# Patient Record
Sex: Female | Born: 1945 | Race: Black or African American | Hispanic: No | Marital: Married | Smoking: Never smoker
Health system: Southern US, Community
[De-identification: ages and names within clinical notes are randomized; demographics above are authoritative.]

## PROBLEM LIST (undated history)

## (undated) DIAGNOSIS — E119 Type 2 diabetes mellitus without complications: Secondary | ICD-10-CM

## (undated) DIAGNOSIS — I4891 Unspecified atrial fibrillation: Secondary | ICD-10-CM

## (undated) DIAGNOSIS — I10 Essential (primary) hypertension: Secondary | ICD-10-CM

## (undated) DIAGNOSIS — I639 Cerebral infarction, unspecified: Secondary | ICD-10-CM

## (undated) HISTORY — PX: TONSILLECTOMY: SUR1361

## (undated) HISTORY — PX: CHOLECYSTECTOMY: SHX55

## (undated) HISTORY — PX: BILATERAL OOPHORECTOMY: SHX1221

## (undated) HISTORY — PX: PACEMAKER INSERTION: SHX728

## (undated) HISTORY — PX: ABDOMINAL HYSTERECTOMY: SHX81

---

## 2014-03-06 ENCOUNTER — Emergency Department (HOSPITAL_BASED_OUTPATIENT_CLINIC_OR_DEPARTMENT_OTHER): Payer: Medicare Other

## 2014-03-06 ENCOUNTER — Encounter (HOSPITAL_BASED_OUTPATIENT_CLINIC_OR_DEPARTMENT_OTHER): Payer: Self-pay | Admitting: Emergency Medicine

## 2014-03-06 ENCOUNTER — Emergency Department (HOSPITAL_BASED_OUTPATIENT_CLINIC_OR_DEPARTMENT_OTHER)
Admission: EM | Admit: 2014-03-06 | Discharge: 2014-03-06 | Disposition: A | Discharge status: Home / Self Care | Payer: Medicare Other | Attending: Emergency Medicine | Admitting: Emergency Medicine

## 2014-03-06 DIAGNOSIS — R0602 Shortness of breath: Secondary | ICD-10-CM | POA: Insufficient documentation

## 2014-03-06 DIAGNOSIS — Z950 Presence of cardiac pacemaker: Secondary | ICD-10-CM | POA: Insufficient documentation

## 2014-03-06 DIAGNOSIS — R209 Unspecified disturbances of skin sensation: Secondary | ICD-10-CM | POA: Insufficient documentation

## 2014-03-06 DIAGNOSIS — R0789 Other chest pain: Secondary | ICD-10-CM | POA: Insufficient documentation

## 2014-03-06 DIAGNOSIS — R079 Chest pain, unspecified: Secondary | ICD-10-CM

## 2014-03-06 DIAGNOSIS — I10 Essential (primary) hypertension: Secondary | ICD-10-CM | POA: Insufficient documentation

## 2014-03-06 DIAGNOSIS — R6883 Chills (without fever): Secondary | ICD-10-CM | POA: Insufficient documentation

## 2014-03-06 HISTORY — DX: Essential (primary) hypertension: I10

## 2014-03-06 LAB — BASIC METABOLIC PANEL
BUN: 19 mg/dL (ref 6–23)
CHLORIDE: 101 meq/L (ref 96–112)
CO2: 24 meq/L (ref 19–32)
Calcium: 9.4 mg/dL (ref 8.4–10.5)
Creatinine, Ser: 1 mg/dL (ref 0.50–1.10)
GFR calc Af Amer: 67 mL/min — ABNORMAL LOW (ref >90)
GFR, EST NON AFRICAN AMERICAN: 57 mL/min — AB (ref >90)
GLUCOSE: 152 mg/dL — AB (ref 70–99)
POTASSIUM: 4 meq/L (ref 3.7–5.3)
SODIUM: 141 meq/L (ref 137–147)

## 2014-03-06 LAB — TROPONIN I: Troponin I: <0.3 ng/mL (ref <0.30)

## 2014-03-06 LAB — CBC
HEMATOCRIT: 36.4 % (ref 36.0–46.0)
Hemoglobin: 12.6 g/dL (ref 12.0–15.0)
MCH: 31.3 pg (ref 26.0–34.0)
MCHC: 34.6 g/dL (ref 30.0–36.0)
MCV: 90.3 fL (ref 78.0–100.0)
Platelets: 243 10*3/uL (ref 150–400)
RBC: 4.03 MIL/uL (ref 3.87–5.11)
RDW: 12.6 % (ref 11.5–15.5)
WBC: 6.7 10*3/uL (ref 4.0–10.5)

## 2014-03-06 MED ORDER — NITROGLYCERIN 0.4 MG SL SUBL: 0.4000 mg | SUBLINGUAL_TABLET | SUBLINGUAL | Status: DC | PRN

## 2014-03-06 MED ORDER — ASPIRIN 81 MG PO CHEW
324.0000 mg | CHEWABLE_TABLET | Freq: Once | ORAL | Status: AC
  Administered 2014-03-06: 324 mg via ORAL
  Filled 2014-03-06: qty 4

## 2014-03-06 NOTE — ED Notes (Signed)
1415 after driving back from the store she got weakness in left arm. Now she feels weak on her whole left side.

## 2014-03-06 NOTE — ED Notes (Signed)
MD Criss Alvine stated Pt was able to eat and drink. Gave Pt water, cheese and graham crackers

## 2014-03-06 NOTE — ED Provider Notes (Signed)
CSN: [id number]     Arrival date & time 03/06/14  1639 History  This chart was scribed for Brooke Camel, MD by Charline Bills, ED Scribe. The patient was seen in room MH09/MH09. Patient's care was started at 4:59 PM.   Chief Complaint  Patient presents with  . Weakness   The history is provided by the patient. No language interpreter was used.   HPI Comments: Brooke Pitts is a 68 y.o. female who presents to the Emergency Department complaining of weakness onset 2:30 PM. She states that she was at the grocery store for approximately 20 minutes before the symptoms began. Pt checked her blood pressure with readings of 117/91 and pulse in the 90s before relaxing in a recliner. Pt reports intermittent SOB, chest tightness that has improved, L arm weakness, L arm numbness/tingling, chills. She denies HA, visual disturbances, weakness in L leg. Pt reports similar episode 4 days ago and went to the ED at [facility] who obtained blood work which was normal and a CT which was normal. Pt states that they suspected a mini stroke but did not obtain a MRI due to her pace maker; pt was discharged. Pt states that she has a MRI compatible pace maker inserted in 10/2013. She also reports a milder episode 3 days ago. She took a baby ASA this morning. Pt has a h/o tachycardia and angina. She states that this episode is milder but more frequent than her angina. She denies h/o blockage.  She had a pacemaker placed in January 2015 and had a negative heart cath at that time.  Cardiologist: Chales Abrahams in [facility]  Past Medical History  Diagnosis Date  . Hypertension    Past Surgical History  Procedure Laterality Date  . Pacemaker insertion    . Abdominal hysterectomy    . Cholecystectomy     No family history on file. History  Substance Use Topics  . Smoking status: Not on file  . Smokeless tobacco: Not on file  . Alcohol Use: Not on file   OB History   Grav Para Term Preterm Abortions TAB SAB Ect Mult Living                  Review of Systems  Constitutional: Positive for chills.  Eyes: Negative for visual disturbance.  Respiratory: Positive for chest tightness (improved) and shortness of breath.   Cardiovascular: Positive for chest pain. Negative for leg swelling.  Gastrointestinal: Negative for nausea, vomiting and abdominal pain.  Musculoskeletal: Negative for neck pain.  Neurological: Positive for weakness and numbness. Negative for headaches.  All other systems reviewed and are negative.  Allergies  Review of patient's allergies indicates not on file.  Home Medications   Prior to Admission medications   Not on File   Triage Vitals: BP 129/76  Pulse 71  Temp(Src) 98 F (36.7 C) (Oral)  Resp 18  Ht 5\' 5"  (1.651 m)  Wt 211 lb (95.709 kg)  BMI 35.11 kg/m2  SpO2 100% Physical Exam  Nursing note and vitals reviewed. Constitutional: She is oriented to person, place, and time. She appears well-developed and well-nourished. No distress.  HENT:  Head: Normocephalic and atraumatic.  Right Ear: External ear normal.  Left Ear: External ear normal.  Nose: Nose normal.  Eyes: Right eye exhibits no discharge. Left eye exhibits no discharge.  Neck: Neck supple.  Cardiovascular: Normal rate, regular rhythm and normal heart sounds.   Pulses:      Radial pulses are 2+ on the  right side, and 2+ on the left side.  Pulmonary/Chest: Effort normal and breath sounds normal. No respiratory distress. She exhibits no tenderness.  Abdominal: She exhibits no distension. There is no tenderness.  Musculoskeletal: Normal range of motion.  Neurological: She is alert and oriented to person, place, and time.  cranial nerves 2-12 grossly intact 5/5 strengths in all extremities   Skin: Skin is warm and dry.  Psychiatric: She has a normal mood and affect. Her behavior is normal.   ED Course  Procedures (including critical care time) DIAGNOSTIC STUDIES: Oxygen Saturation is 100% on RA, normal by my  interpretation.    COORDINATION OF CARE: 5:09 PM Discussed treatment plan with pt at bedside and pt agreed to plan.  Labs Review Labs Reviewed  BASIC METABOLIC PANEL - Abnormal; Notable for the following:    Glucose, Bld 152 (*)    GFR calc non Af Amer 57 (*)    GFR calc Af Amer 67 (*)    All other components within normal limits  CBC  TROPONIN I  TROPONIN I    Imaging Review Dg Chest 2 View  03/06/2014   CLINICAL DATA:  Chest pain  EXAM: CHEST  2 VIEW  COMPARISON:  10/26/2013  FINDINGS: Dual lead pacemaker unchanged from the prior study. Heart size is upper normal. Negative for heart failure. Lungs are clear.  IMPRESSION: No active cardiopulmonary disease.   Electronically Signed   By: Marlan Palauharles  Clark M.D.   On: 03/06/2014 18:21     Date: 03/06/2014  Rate: 79  Rhythm: atrially paced rhythm  QRS Axis: normal  Intervals: PR prolonged  ST/T Wave abnormalities: nonspecific T wave changes  Conduction Disutrbances:none  Narrative Interpretation:   Old EKG Reviewed: none available    MDM   Final diagnoses:  Chest pain    Patient with atpyical chest tightness, dyspnea and arm "weakness". Her neuro exam is normal, including sensation and strength. Had a reportedly negative CT a few days ago, but this does not seem like a CVA given she has normal neuro testing and this is with chest symptoms. Doubt dissection given intermittent symptoms. She relates this to "angina". EKG nonspecific, troponin normal. Discussed with Dr. Chales Abrahamsyson, her cardiologist, who relates she has a completely normal cath and subsequent echo in the last 3 months. Thus ACS is unlikely. He recommends delta troponin, and if negative she will call his office in the AM for close follow up. Patient agreeable to this plan.   I personally performed the services described in this documentation, which was scribed in my presence. The recorded information has been reviewed and is accurate.    Brooke CamelScott T Rital Cavey, MD 03/06/14  2149

## 2014-03-06 NOTE — Discharge Instructions (Signed)
Chest Pain (Nonspecific) °It is often hard to give a specific diagnosis for the cause of chest pain. There is always a chance that your pain could be related to something serious, such as a heart attack or a blood clot in the lungs. You need to follow up with your caregiver for further evaluation. °CAUSES  °· Heartburn. °· Pneumonia or bronchitis. °· Anxiety or stress. °· Inflammation around your heart (pericarditis) or lung (pleuritis or pleurisy). °· A blood clot in the lung. °· A collapsed lung (pneumothorax). It can develop suddenly on its own (spontaneous pneumothorax) or from injury (trauma) to the chest. °· Shingles infection (herpes zoster virus). °The chest wall is composed of bones, muscles, and cartilage. Any of these can be the source of the pain. °· The bones can be bruised by injury. °· The muscles or cartilage can be strained by coughing or overwork. °· The cartilage can be affected by inflammation and become sore (costochondritis). °DIAGNOSIS  °Lab tests or other studies, such as X-rays, electrocardiography, stress testing, or cardiac imaging, may be needed to find the cause of your pain.  °TREATMENT  °· Treatment depends on what may be causing your chest pain. Treatment may include: °· Acid blockers for heartburn. °· Anti-inflammatory medicine. °· Pain medicine for inflammatory conditions. °· Antibiotics if an infection is present. °· You may be advised to change lifestyle habits. This includes stopping smoking and avoiding alcohol, caffeine, and chocolate. °· You may be advised to keep your head raised (elevated) when sleeping. This reduces the chance of acid going backward from your stomach into your esophagus. °· Most of the time, nonspecific chest pain will improve within 2 to 3 days with rest and mild pain medicine. °HOME CARE INSTRUCTIONS  °· If antibiotics were prescribed, take your antibiotics as directed. Finish them even if you start to feel better. °· For the next few days, avoid physical  activities that bring on chest pain. Continue physical activities as directed. °· Do not smoke. °· Avoid drinking alcohol. °· Only take over-the-counter or prescription medicine for pain, discomfort, or fever as directed by your caregiver. °· Follow your caregiver's suggestions for further testing if your chest pain does not go away. °· Keep any follow-up appointments you made. If you do not go to an appointment, you could develop lasting (chronic) problems with pain. If there is any problem keeping an appointment, you must call to reschedule. °SEEK MEDICAL CARE IF:  °· You think you are having problems from the medicine you are taking. Read your medicine instructions carefully. °· Your chest pain does not go away, even after treatment. °· You develop a rash with blisters on your chest. °SEEK IMMEDIATE MEDICAL CARE IF:  °· You have increased chest pain or pain that spreads to your arm, neck, jaw, back, or abdomen. °· You develop shortness of breath, an increasing cough, or you are coughing up blood. °· You have severe back or abdominal pain, feel nauseous, or vomit. °· You develop severe weakness, fainting, or chills. °· You have a fever. °THIS IS AN EMERGENCY. Do not wait to see if the pain will go away. Get medical help at once. Call your local emergency services (911 in [place] Do not drive yourself to the hospital. °MAKE SURE YOU:  °· Understand these instructions. °· Will watch your condition. °· Will get help right away if you are not doing well or get worse. °Document Released: 07/09/2005 Document Revised: 12/22/2011 Document Reviewed: 05/04/2008 °Exit[employer]® Patient Information ©2014 [employer]   [employer]. ° °

## 2014-03-06 NOTE — ED Notes (Signed)
Brooke Pitts -will call back on [phone]

## 2015-10-03 DIAGNOSIS — E785 Hyperlipidemia, unspecified: Secondary | ICD-10-CM | POA: Insufficient documentation

## 2015-10-03 DIAGNOSIS — I10 Essential (primary) hypertension: Secondary | ICD-10-CM

## 2015-10-03 DIAGNOSIS — E1142 Type 2 diabetes mellitus with diabetic polyneuropathy: Secondary | ICD-10-CM

## 2015-10-03 HISTORY — DX: Essential (primary) hypertension: I10

## 2015-10-03 HISTORY — DX: Hyperlipidemia, unspecified: E78.5

## 2015-10-18 DIAGNOSIS — Z8673 Personal history of transient ischemic attack (TIA), and cerebral infarction without residual deficits: Secondary | ICD-10-CM | POA: Insufficient documentation

## 2015-10-18 DIAGNOSIS — Z7901 Long term (current) use of anticoagulants: Secondary | ICD-10-CM

## 2015-10-18 DIAGNOSIS — I4891 Unspecified atrial fibrillation: Secondary | ICD-10-CM | POA: Insufficient documentation

## 2015-10-18 HISTORY — DX: Long term (current) use of anticoagulants: Z79.01

## 2015-10-18 HISTORY — DX: Personal history of transient ischemic attack (TIA), and cerebral infarction without residual deficits: Z86.73

## 2015-11-02 DIAGNOSIS — E119 Type 2 diabetes mellitus without complications: Secondary | ICD-10-CM

## 2015-11-02 HISTORY — DX: Type 2 diabetes mellitus without complications: E11.9

## 2015-11-03 DIAGNOSIS — K219 Gastro-esophageal reflux disease without esophagitis: Secondary | ICD-10-CM

## 2015-11-03 DIAGNOSIS — R0789 Other chest pain: Secondary | ICD-10-CM | POA: Insufficient documentation

## 2015-11-03 HISTORY — DX: Other chest pain: R07.89

## 2015-11-03 HISTORY — DX: Gastro-esophageal reflux disease without esophagitis: K21.9

## 2015-11-17 DIAGNOSIS — Z9989 Dependence on other enabling machines and devices: Secondary | ICD-10-CM

## 2015-11-17 DIAGNOSIS — Z950 Presence of cardiac pacemaker: Secondary | ICD-10-CM | POA: Insufficient documentation

## 2015-11-17 DIAGNOSIS — Z8669 Personal history of other diseases of the nervous system and sense organs: Secondary | ICD-10-CM

## 2015-11-17 DIAGNOSIS — H8110 Benign paroxysmal vertigo, unspecified ear: Secondary | ICD-10-CM

## 2015-11-17 DIAGNOSIS — K5000 Crohn's disease of small intestine without complications: Secondary | ICD-10-CM

## 2015-11-17 DIAGNOSIS — G4733 Obstructive sleep apnea (adult) (pediatric): Secondary | ICD-10-CM

## 2015-11-17 DIAGNOSIS — Z8739 Personal history of other diseases of the musculoskeletal system and connective tissue: Secondary | ICD-10-CM

## 2015-11-17 HISTORY — DX: Personal history of other diseases of the nervous system and sense organs: Z86.69

## 2015-11-17 HISTORY — DX: Crohn's disease of small intestine without complications: K50.00

## 2015-11-17 HISTORY — DX: Dependence on other enabling machines and devices: Z99.89

## 2015-11-17 HISTORY — DX: Disorder of iron metabolism, unspecified: E83.10

## 2015-11-17 HISTORY — DX: Obstructive sleep apnea (adult) (pediatric): G47.33

## 2015-11-17 HISTORY — DX: Personal history of other diseases of the musculoskeletal system and connective tissue: Z87.39

## 2015-11-17 HISTORY — DX: Benign paroxysmal vertigo, unspecified ear: H81.10

## 2015-11-17 HISTORY — DX: Presence of cardiac pacemaker: Z95.0

## 2015-11-20 DIAGNOSIS — Z45018 Encounter for adjustment and management of other part of cardiac pacemaker: Secondary | ICD-10-CM | POA: Insufficient documentation

## 2015-11-20 DIAGNOSIS — I495 Sick sinus syndrome: Secondary | ICD-10-CM

## 2015-11-20 HISTORY — DX: Encounter for adjustment and management of other part of cardiac pacemaker: Z45.018

## 2015-11-20 HISTORY — DX: Sick sinus syndrome: I49.5

## 2015-12-03 DIAGNOSIS — E559 Vitamin D deficiency, unspecified: Secondary | ICD-10-CM

## 2015-12-03 HISTORY — DX: Vitamin D deficiency, unspecified: E55.9

## 2016-02-15 DIAGNOSIS — E1141 Type 2 diabetes mellitus with diabetic mononeuropathy: Secondary | ICD-10-CM | POA: Insufficient documentation

## 2016-02-15 HISTORY — DX: Type 2 diabetes mellitus with diabetic mononeuropathy: E11.41

## 2016-06-30 DIAGNOSIS — E876 Hypokalemia: Secondary | ICD-10-CM

## 2016-06-30 HISTORY — DX: Hypokalemia: E87.6

## 2016-07-08 DIAGNOSIS — R790 Abnormal level of blood mineral: Secondary | ICD-10-CM | POA: Insufficient documentation

## 2016-07-08 HISTORY — DX: Abnormal level of blood mineral: R79.0

## 2016-07-08 HISTORY — DX: Hypomagnesemia: E83.42

## 2016-08-21 DIAGNOSIS — Z9181 History of falling: Secondary | ICD-10-CM

## 2016-08-21 HISTORY — DX: History of falling: Z91.81

## 2016-09-18 DIAGNOSIS — E66811 Obesity, class 1: Secondary | ICD-10-CM

## 2016-09-18 DIAGNOSIS — E669 Obesity, unspecified: Secondary | ICD-10-CM

## 2016-09-18 HISTORY — DX: Obesity, class 1: E66.811

## 2016-09-18 HISTORY — DX: Obesity, unspecified: E66.9

## 2016-11-27 ENCOUNTER — Emergency Department (HOSPITAL_BASED_OUTPATIENT_CLINIC_OR_DEPARTMENT_OTHER): Payer: Medicare Other

## 2016-11-27 ENCOUNTER — Encounter (HOSPITAL_BASED_OUTPATIENT_CLINIC_OR_DEPARTMENT_OTHER): Payer: Self-pay

## 2016-11-27 ENCOUNTER — Emergency Department (HOSPITAL_BASED_OUTPATIENT_CLINIC_OR_DEPARTMENT_OTHER)
Admission: EM | Admit: 2016-11-27 | Discharge: 2016-11-27 | Disposition: A | Discharge status: Home / Self Care | Payer: Medicare Other | Attending: Emergency Medicine | Admitting: Emergency Medicine

## 2016-11-27 DIAGNOSIS — Z7982 Long term (current) use of aspirin: Secondary | ICD-10-CM | POA: Diagnosis not present

## 2016-11-27 DIAGNOSIS — I10 Essential (primary) hypertension: Secondary | ICD-10-CM | POA: Insufficient documentation

## 2016-11-27 DIAGNOSIS — R002 Palpitations: Secondary | ICD-10-CM | POA: Insufficient documentation

## 2016-11-27 DIAGNOSIS — R202 Paresthesia of skin: Secondary | ICD-10-CM | POA: Insufficient documentation

## 2016-11-27 DIAGNOSIS — Z7901 Long term (current) use of anticoagulants: Secondary | ICD-10-CM | POA: Diagnosis not present

## 2016-11-27 DIAGNOSIS — Z7984 Long term (current) use of oral hypoglycemic drugs: Secondary | ICD-10-CM | POA: Diagnosis not present

## 2016-11-27 DIAGNOSIS — R0600 Dyspnea, unspecified: Secondary | ICD-10-CM | POA: Diagnosis not present

## 2016-11-27 DIAGNOSIS — E119 Type 2 diabetes mellitus without complications: Secondary | ICD-10-CM | POA: Insufficient documentation

## 2016-11-27 DIAGNOSIS — Z950 Presence of cardiac pacemaker: Secondary | ICD-10-CM | POA: Insufficient documentation

## 2016-11-27 DIAGNOSIS — R0602 Shortness of breath: Secondary | ICD-10-CM | POA: Diagnosis present

## 2016-11-27 DIAGNOSIS — G5622 Lesion of ulnar nerve, left upper limb: Secondary | ICD-10-CM

## 2016-11-27 HISTORY — DX: Unspecified atrial fibrillation: I48.91

## 2016-11-27 HISTORY — DX: Cerebral infarction, unspecified: I63.9

## 2016-11-27 HISTORY — DX: Type 2 diabetes mellitus without complications: E11.9

## 2016-11-27 LAB — COMPREHENSIVE METABOLIC PANEL
ALT: 19 U/L (ref 14–54)
AST: 22 U/L (ref 15–41)
Albumin: 3.9 g/dL (ref 3.5–5.0)
Alkaline Phosphatase: 75 U/L (ref 38–126)
Anion gap: 7 (ref 5–15)
BUN: 17 mg/dL (ref 6–20)
CO2: 30 mmol/L (ref 22–32)
Calcium: 9.2 mg/dL (ref 8.9–10.3)
Chloride: 99 mmol/L — ABNORMAL LOW (ref 101–111)
Creatinine, Ser: 1.41 mg/dL — ABNORMAL HIGH (ref 0.44–1.00)
GFR calc Af Amer: 43 mL/min — ABNORMAL LOW (ref >60)
GFR, EST NON AFRICAN AMERICAN: 37 mL/min — AB (ref >60)
Glucose, Bld: 234 mg/dL — ABNORMAL HIGH (ref 65–99)
Potassium: 3.9 mmol/L (ref 3.5–5.1)
Sodium: 136 mmol/L (ref 135–145)
TOTAL PROTEIN: 6.8 g/dL (ref 6.5–8.1)
Total Bilirubin: 0.6 mg/dL (ref 0.3–1.2)

## 2016-11-27 LAB — CBC WITH DIFFERENTIAL/PLATELET
BASOS ABS: 0 10*3/uL (ref 0.0–0.1)
Basophils Relative: 0 %
EOS PCT: 2 %
Eosinophils Absolute: 0.2 10*3/uL (ref 0.0–0.7)
HCT: 37 % (ref 36.0–46.0)
Hemoglobin: 12.5 g/dL (ref 12.0–15.0)
LYMPHS PCT: 50 %
Lymphs Abs: 3.5 10*3/uL (ref 0.7–4.0)
MCH: 30.6 pg (ref 26.0–34.0)
MCHC: 33.8 g/dL (ref 30.0–36.0)
MCV: 90.5 fL (ref 78.0–100.0)
MONO ABS: 0.5 10*3/uL (ref 0.1–1.0)
MONOS PCT: 7 %
Neutro Abs: 2.9 10*3/uL (ref 1.7–7.7)
Neutrophils Relative %: 41 %
Platelets: 216 10*3/uL (ref 150–400)
RBC: 4.09 MIL/uL (ref 3.87–5.11)
RDW: 12.9 % (ref 11.5–15.5)
WBC: 7.1 10*3/uL (ref 4.0–10.5)

## 2016-11-27 LAB — I-STAT TROPONIN, ED
TROPONIN I, POC: 0.01 ng/mL (ref 0.00–0.08)
TROPONIN I, POC: 0 ng/mL (ref 0.00–0.08)

## 2016-11-27 LAB — PROTIME-INR
INR: 1.95
PROTHROMBIN TIME: 22.5 s — AB (ref 11.4–15.2)

## 2016-11-27 NOTE — Progress Notes (Signed)
I-stat troponin result given to Dr Schlossman. 

## 2016-11-27 NOTE — ED Provider Notes (Signed)
[facility] Note   CSN: [id number] Arrival date & time: 11/27/16  1912  By signing my name below, I, Brooke Pitts, attest that this documentation has been prepared under the direction and in the presence of physician practitioner, Brooke MondayErin Lateshia Schmoker, MD. Electronically Signed: Linna Darnerussell Pitts, Scribe. 11/27/2016. 9:21 PM.  History   Chief Complaint Chief Complaint  Patient presents with  . Shortness of Breath    The history is provided by the patient. No language interpreter was used.     HPI Comments: Brooke Pitts is a 71 y.o. female with pacemaker and anticoagulated with Coumadin, with PMHx of A-Fib, DM, HTN, and stroke, who presents to the Emergency Department complaining of persistent SOB following an episode of chest pain d/t indigestion around 6 PM this evening. She states she ate cabbage around 530 PM this evening and her indigestion presented ~ 30 minutes afterwards. Pt took OTC medication with transient improvement of her indigestion and experienced SOB, left arm pain secondary to a "knot", and numbness/tingling in her left fingers shortly after her indigestion began. She states it felt like she was having difficulty taking a deep breath PTA. No modifying factors noted. She states her indigestion has resolved and she feels better. Pt notes her numbness/tingling is still present. Pt notes her Coumadin level was measured earlier today and was within normal limits. She denies nausea, vomiting, diaphoresis, abdominal pain, cough, fever, leg pain/swelling, or any other associated symptoms.  Past Medical History:  Diagnosis Date  . A-fib (HCC)   . Diabetes mellitus without complication (HCC)   . Hypertension   . Stroke [facility])     There are no active problems to display for this patient.   Past Surgical History:  Procedure Laterality Date  . ABDOMINAL HYSTERECTOMY    . CHOLECYSTECTOMY    . PACEMAKER INSERTION    . TONSILLECTOMY      OB History    No  data available       Home Medications    Prior to Admission medications   Medication Sig Start Date End Date Taking? Authorizing Provider  UNKNOWN TO PATIENT Another BP med   Yes Historical Provider, MD  Warfarin Sodium (COUMADIN PO) Take by mouth.   Yes Historical Provider, MD  atenolol (TENORMIN) 50 MG tablet Take 50 mg by mouth daily.    Historical Provider, MD  b complex vitamins tablet Take 1 tablet by mouth daily.    Historical Provider, MD  Calcium Carbonate-Vitamin D (CALCIUM + D PO) Take by mouth.    Historical Provider, MD  metFORMIN (GLUCOPHAGE) 500 MG tablet Take by mouth 2 (two) times daily with a meal.    Historical Provider, MD  Omega-3 Fatty Acids (FISH OIL) 1200 MG CPDR Take by mouth.    Historical Provider, MD  ranolazine (RANEXA) 500 MG 12 hr tablet Take 500 mg by mouth 2 (two) times daily.    Historical Provider, MD  triamterene-hydrochlorothiazide (DYAZIDE) 37.5-25 MG per capsule Take 1 capsule by mouth daily.    Historical Provider, MD    Family History No family history on file.  Social History Social History  Substance Use Topics  . Smoking status: Never Smoker  . Smokeless tobacco: Never Used  . Alcohol use No     Allergies   Cymbalta [duloxetine hcl]; Hydrocodone; and Lisinopril   Review of Systems Review of Systems  Constitutional: Negative for diaphoresis and fever.  HENT: Negative for sore throat.   Eyes: Negative for visual disturbance.  Respiratory: Positive for shortness of breath. Negative for cough.   Cardiovascular: Positive for chest pain (indigestion) and palpitations. Negative for leg swelling.  Gastrointestinal: Negative for abdominal pain, nausea and vomiting.  Genitourinary: Negative for difficulty urinating.  Musculoskeletal: Positive for myalgias. Negative for back pain and neck pain.  Skin: Negative for rash.  Neurological: Positive for numbness. Negative for syncope and headaches.  Hematological: Bruises/bleeds easily.      Physical Exam Updated Vital Signs BP 140/78 (BP Location: Left Arm)   Pulse 70   Temp 98.2 F (36.8 C) (Oral)   Resp 17   Ht 5\' 4"  (1.626 m)   Wt 222 lb (100.7 kg)   SpO2 100%   BMI 38.11 kg/m   Physical Exam  Constitutional: She is oriented to person, place, and time. She appears well-developed and well-nourished. No distress.  HENT:  Head: Normocephalic and atraumatic.  Eyes: Conjunctivae and EOM are normal.  Neck: Normal range of motion. Neck supple. No JVD present. No tracheal deviation present.  Cardiovascular: Normal rate, regular rhythm, normal heart sounds and intact distal pulses.  Exam reveals no gallop and no friction rub.   No murmur heard. Normal pulses.  Pulmonary/Chest: Effort normal and breath sounds normal. No respiratory distress. She has no wheezes. She has no rales.  Abdominal: Soft. She exhibits no distension. There is no tenderness. There is no guarding.  Musculoskeletal: Normal range of motion. She exhibits no edema or tenderness.  Neurological: She is alert and oriented to person, place, and time.  Altered sensation on ulnar side of left hand.  Skin: Skin is warm and dry. No rash noted. She is not diaphoretic. No erythema.  0.8 cm circular, mobile nodule with bluish discoloration left AC. No fluctuance. No extension of area. No arm swelling.  Psychiatric: She has a normal mood and affect. Her behavior is normal.  Nursing note and vitals reviewed.    ED Treatments / Results  Labs (all labs ordered are listed, but only abnormal results are displayed) Labs Reviewed  COMPREHENSIVE METABOLIC PANEL - Abnormal; Notable for the following:       Result Value   Chloride 99 (*)    Glucose, Bld 234 (*)    Creatinine, Ser 1.41 (*)    GFR calc non Af Amer 37 (*)    GFR calc Af Amer 43 (*)    All other components within normal limits  PROTIME-INR - Abnormal; Notable for the following:    Prothrombin Time 22.5 (*)    All other components within normal  limits  CBC WITH DIFFERENTIAL/PLATELET  I-STAT TROPOININ, ED  I-STAT TROPOININ, ED    EKG  EKG Interpretation  Date/Time:  Thursday November 27 2016 19:34:36 EST Ventricular Rate:  75 PR Interval:  186 QRS Duration: 82 QT Interval:  386 QTC Calculation: 432 R Axis:   -2 Text Interpretation:  Sinus rhythm Ventricular premature complex Low voltage, precordial leads Baseline wander in lead(s) III aVF V4 Rhythm more clearly sinus.  No other significant change Confirmed by [facility] MD, Dalisha Shively ([id number]) on 11/27/2016 8:28:33 PM       Radiology Dg Chest 2 View  Result Date: 11/27/2016 CLINICAL DATA:  Shortness of breath EXAM: CHEST  2 VIEW COMPARISON:  Chest radiograph 08/17/2016 FINDINGS: Left chest wall pacemaker leads are in expected location. The lungs are well inflated. Cardiomediastinal contours are normal. No pneumothorax or pleural effusion. No focal airspace consolidation or pulmonary edema. IMPRESSION: Clear lungs. Electronically Signed   By: Chrisandra Netters.D.  On: 11/27/2016 20:06    Procedures Procedures (including critical care time)  DIAGNOSTIC STUDIES: Oxygen Saturation is 100% on RA, normal by my interpretation.    COORDINATION OF CARE: 9:34 PM Discussed treatment plan with pt at bedside and pt agreed to plan.  Medications Ordered in ED Medications - No data to display   Initial Impression / Assessment and Plan / ED Course  I have reviewed the triage vital signs and the nursing notes.  Pertinent labs & imaging results that were available during my care of the patient were reviewed by me and considered in my medical decision making (see chart for details).     71yo female with hx of afib on coumadin, Dm, htn, DVA presents with concern for episode of dyspneaj and left arm lesion. EKG was done and evaluate by me and showed no acute ST changes and no signs of pericarditis. Chest x-ray was done and evaluated by me and radiology and showed no sign of pneumonia or  pneumothorax. Have low suspicion for PE given no continuing dyspnea, pt on coumadin (with level therapeutic earlier today), no hypoxia, no tachypnea.  Patient had delta troponins which were both negative and overall lower suspicion for cardiac pain by history. Patient without symptoms now, no exertional symptoms, and indigestion pain that began after eating. Given this evaluation, history and physical have low suspicion for pulmonary embolus, pneumonia, ACS, myocarditis, pericarditis, dissection.   She also reports pain over nodule in left arm and tingling on ulnar side of hand.  Area of concern appears to be more likely scar tissue, or other nerve lesion. No sign of infection, doubt DVT given very localized to circular cystic area, no other palpable cord.  Discussed that this lesion may be affecting ulnar tingling. Patient discharged in stable condition with understanding of reasons to return. Recommend close Cardiology followup.   Final Clinical Impressions(s) / ED Diagnoses   Final diagnoses:  Dyspnea, unspecified type  Tingling in left ulnar nerve distribution, cystic lesion present left AC and contusion    New Prescriptions Discharge Medication List as of 11/27/2016 11:17 PM    I personally performed the services described in this documentation, which was scribed in my presence. The recorded information has been reviewed and is accurate.    Brooke Monday, MD 11/28/16 [phone]

## 2016-11-27 NOTE — ED Triage Notes (Signed)
C/o SOB x 1 hour-"knot" to left arm with tingling x 1 hour-denies injury to arm-pt NAD-steady gait

## 2016-11-27 NOTE — ED Notes (Signed)
ED Provider at bedside. 

## 2016-11-27 NOTE — ED Notes (Signed)
Patient transported to X-ray 

## 2016-11-27 NOTE — Progress Notes (Signed)
I stat troponin results given to Dr. Schlossman  

## 2017-04-17 DIAGNOSIS — H20021 Recurrent acute iridocyclitis, right eye: Secondary | ICD-10-CM

## 2017-04-17 HISTORY — DX: Recurrent acute iridocyclitis, right eye: H20.021

## 2017-06-10 ENCOUNTER — Ambulatory Visit (INDEPENDENT_AMBULATORY_CARE_PROVIDER_SITE_OTHER): Payer: Medicare Other | Admitting: Cardiology

## 2017-06-10 ENCOUNTER — Encounter: Payer: Self-pay | Admitting: Cardiology

## 2017-06-10 VITALS — BP 126/60 | HR 72 | Ht 64.0 in | Wt 221.1 lb

## 2017-06-10 DIAGNOSIS — E785 Hyperlipidemia, unspecified: Secondary | ICD-10-CM | POA: Diagnosis not present

## 2017-06-10 DIAGNOSIS — Z8673 Personal history of transient ischemic attack (TIA), and cerebral infarction without residual deficits: Secondary | ICD-10-CM

## 2017-06-10 DIAGNOSIS — Z950 Presence of cardiac pacemaker: Secondary | ICD-10-CM | POA: Diagnosis not present

## 2017-06-10 DIAGNOSIS — I4891 Unspecified atrial fibrillation: Secondary | ICD-10-CM

## 2017-06-10 DIAGNOSIS — Z7901 Long term (current) use of anticoagulants: Secondary | ICD-10-CM

## 2017-06-10 DIAGNOSIS — I10 Essential (primary) hypertension: Secondary | ICD-10-CM

## 2017-06-10 DIAGNOSIS — E1142 Type 2 diabetes mellitus with diabetic polyneuropathy: Secondary | ICD-10-CM

## 2017-06-10 DIAGNOSIS — Z45018 Encounter for adjustment and management of other part of cardiac pacemaker: Secondary | ICD-10-CM

## 2017-06-10 MED ORDER — NITROGLYCERIN 0.4 MG SL SUBL: 0.4000 mg | SUBLINGUAL_TABLET | SUBLINGUAL | 6 refills | Status: DC | PRN

## 2017-06-10 NOTE — Progress Notes (Signed)
Cardiology Office Note:    Date:  06/10/2017   ID:  Brooke Pitts, DOB 10/26/1945, MRN [id number]  PCP:  Loyal JacobsonKalish, Michael, MD  Cardiologist:  Garwin Brothersajan R Ciella Obi, MD   Referring MD: Loyal JacobsonKalish, Michael, MD    ASSESSMENT:    1. Atrial fibrillation, unspecified type (HCC)   2. Essential hypertension   3. Type 2 diabetes mellitus with diabetic polyneuropathy, without long-term current use of insulin (HCC)   4. History of CVA (cerebrovascular accident)   5. Hyperlipidemia LDL goal <100   6. Long term current use of anticoagulant therapy   7. Pacemaker reprogramming/check   8. S/P placement of cardiac pacemaker    PLAN:    In order of problems listed above:  1. I discussed with the patient atrial fibrillation, disease process. Management and therapy including rate and rhythm control, anticoagulation benefits and potential risks were discussed extensively with the patient. Patient had multiple questions which were answered to patient's satisfaction. I initiated a discussion with her about new anticoagulants and she is willing to think about it and let me know in the next visit. Echocardiogram will be done to assess murmur heard on auscultation 2. Patient will undergo Lexiscan sestamibi to assess her chest pain symptoms. She has multiple risk factors for coronary artery disease. 3. Sublingual nitroglycerin prescription was sent is protocol and 911 protocol explained and she localized understanding. Further recommendations will be made based on the findings of the aforementioned test.Patient will be seen in follow-up appointment in 1 months or earlier if the patient has any concerns.    Medication Adjustments/Labs and Tests Ordered: Current medicines are reviewed at length with the patient today.  Concerns regarding medicines are outlined above.  No orders of the defined types were placed in this encounter.  No orders of the defined types were placed in this encounter.    History of  Present Illness:    Brooke Pitts is a 71 y.o. female who is being seen today for the evaluation of Atrial fibrillation and permanent pacemaker at the request of Loyal JacobsonKalish, Michael, MD. Patient is a pleasant 71 year old female. She has past medical history of essential hypertension, dyslipidemia and diabetes mellitus. She's had a pacemaker insertion in the past [facility]. Is on anticoagulation with warfarin and it with those doctors. She is here for follow-up and to get established. She wants to transfer her care to our practice. She tells me she had an episode of chest pain radiating to the back on one occasion. This was not related to exertion. She leads a sedentary lifestyle. At the time of my evaluation she is alert awake oriented and no distress.  Past Medical History:  Diagnosis Date  . A-fib (HCC)   . Diabetes mellitus without complication (HCC)   . Hypertension   . Stroke [facility](HCC)     Past Surgical History:  Procedure Laterality Date  . ABDOMINAL HYSTERECTOMY    . CHOLECYSTECTOMY    . PACEMAKER INSERTION    . TONSILLECTOMY      Current Medications: Current Meds  Medication Sig  . atenolol (TENORMIN) 50 MG tablet Take 50 mg by mouth daily.  Marland Kitchen. b complex vitamins tablet Take 1 tablet by mouth daily.  . Calcium Carbonate-Vitamin D (CALCIUM + D PO) Take by mouth.  . DimenhyDRINATE (DRAMAMINE PO) Take 1 tablet by mouth daily as needed.  . magnesium oxide (MAG-OX) 400 MG tablet Take 400 mg by mouth daily.  . metFORMIN (GLUCOPHAGE) 500 MG tablet  Take by mouth 2 (two) times daily with a meal.  . olmesartan-hydrochlorothiazide (BENICAR HCT) 40-25 MG tablet Take 1 tablet by mouth daily.  . Omega-3 Fatty Acids (FISH OIL) 1200 MG CPDR Take by mouth.  . prednisoLONE acetate (PRED FORTE) 1 % ophthalmic suspension 1 drop 4 (four) times daily.  . predniSONE (DELTASONE) 5 MG tablet Take 5 mg by mouth.  . ranolazine (RANEXA) 500 MG 12 hr tablet Take 500 mg by mouth 2 (two)  times daily.  Marland Kitchen UNKNOWN TO PATIENT Another BP med  . Warfarin Sodium (COUMADIN PO) Take 5 mg by mouth daily.      Allergies:   Cymbalta [duloxetine hcl]; Hydrocodone; Lisinopril; and Nsaids   Social History   Social History  . Marital status: Married    Spouse name: N/A  . Number of children: N/A  . Years of education: N/A   Social History Main Topics  . Smoking status: Never Smoker  . Smokeless tobacco: Never Used  . Alcohol use No  . Drug use: No  . Sexual activity: Not Asked   Other Topics Concern  . None   Social History Narrative  . None     Family History: The patient's family history includes Arrhythmia in her mother; Atrial fibrillation in her mother; Heart disease in her brother; Heart failure in her brother.  ROS:   Please see the history of present illness.    All other systems reviewed and are negative.  EKGs/Labs/Other Studies Reviewed:    The following studies were reviewed today: I reviewed records from previous practice and discussed this issues with her at length.   Recent Labs: 11/27/2016: ALT 19; BUN 17; Creatinine, Ser 1.41; Hemoglobin 12.5; Platelets 216; Potassium 3.9; Sodium 136  Recent Lipid Panel No results found for: CHOL, TRIG, HDL, CHOLHDL, VLDL, LDLCALC, LDLDIRECT  Physical Exam:    VS:  BP 126/60   Pulse 72   Ht 5\' 4"  (1.626 m)   Wt 221 lb 1.3 oz (100.3 kg)   SpO2 98%   BMI 37.95 kg/m     Wt Readings from Last 3 Encounters:  06/10/17 221 lb 1.3 oz (100.3 kg)  11/27/16 222 lb (100.7 kg)  03/06/14 211 lb (95.7 kg)     GEN: Patient is in no acute distress HEENT: Normal NECK: No JVD; No carotid bruits LYMPHATICS: No lymphadenopathy CARDIAC: S1 S2 irregular, 2/6 systolic murmur at the apex. RESPIRATORY:  Clear to auscultation without rales, wheezing or rhonchi  ABDOMEN: Soft, non-tender, non-distended MUSCULOSKELETAL:  No edema; No deformity  SKIN: Warm and dry NEUROLOGIC:  Alert and oriented x 3 PSYCHIATRIC:  Normal  affect    Signed, Garwin Brothers, MD  06/10/2017 3:46 PM    [facility]

## 2017-06-10 NOTE — Patient Instructions (Addendum)
Medication Instructions:   Your physician recommends that you continue on your current medications as directed. Please refer to the Current Medication list given to you today.   Labwork:  NONE  Testing/Procedures:  Your physician has requested that you have a lexiscan myoview. For further information please visit [email or web address]. Please follow instruction sheet, as given.  Your physician has requested that you have an echocardiogram. Echocardiography is a painless test that uses sound waves to create images of your heart. It provides your doctor with information about the size and shape of your heart and how well your heart's chambers and valves are working. This procedure takes approximately one hour. There are no restrictions for this procedure.  These test will be done at [address] location. [address], [address], [place], [place] [address]   Follow-Up:  Your physician recommends that you schedule a follow-up appointment in: 1 month with Dr. Tomie Chinaevankar    Also Dr. Tomie Chinaevankar has recommended that you see Dr. Elberta Fortisamnitz for EP consult.    Any Other Special Instructions Will Be Listed Below (If Applicable).     If you need a refill on your cardiac medications before your next appointment, please call your pharmacy.

## 2017-06-22 ENCOUNTER — Telehealth (HOSPITAL_COMMUNITY): Payer: Self-pay | Admitting: *Deleted

## 2017-06-22 NOTE — Telephone Encounter (Signed)
Left message on voicemail per DPR in reference to upcoming appointment scheduled on 06/25/17 at 0945 with detailed instructions given per Myocardial Perfusion Study Information Sheet for the test. LM to arrive 15 minutes early, and that it is imperative to arrive on time for appointment to keep from having the test rescheduled. If you need to cancel or reschedule your appointment, please call the office within 24 hours of your appointment. Failure to do so may result in a cancellation of your appointment, and a $50 no show fee. Phone number given for call back for any questions.

## 2017-06-23 ENCOUNTER — Emergency Department (HOSPITAL_BASED_OUTPATIENT_CLINIC_OR_DEPARTMENT_OTHER): Payer: Medicare Other

## 2017-06-23 ENCOUNTER — Encounter (HOSPITAL_BASED_OUTPATIENT_CLINIC_OR_DEPARTMENT_OTHER): Payer: Self-pay | Admitting: *Deleted

## 2017-06-23 ENCOUNTER — Emergency Department (HOSPITAL_BASED_OUTPATIENT_CLINIC_OR_DEPARTMENT_OTHER)
Admission: EM | Admit: 2017-06-23 | Discharge: 2017-06-23 | Disposition: A | Discharge status: Home / Self Care | Payer: Medicare Other | Attending: Emergency Medicine | Admitting: Emergency Medicine

## 2017-06-23 DIAGNOSIS — Z79899 Other long term (current) drug therapy: Secondary | ICD-10-CM | POA: Diagnosis not present

## 2017-06-23 DIAGNOSIS — Z7901 Long term (current) use of anticoagulants: Secondary | ICD-10-CM | POA: Insufficient documentation

## 2017-06-23 DIAGNOSIS — I10 Essential (primary) hypertension: Secondary | ICD-10-CM | POA: Insufficient documentation

## 2017-06-23 DIAGNOSIS — E1142 Type 2 diabetes mellitus with diabetic polyneuropathy: Secondary | ICD-10-CM | POA: Diagnosis not present

## 2017-06-23 DIAGNOSIS — Z950 Presence of cardiac pacemaker: Secondary | ICD-10-CM | POA: Diagnosis not present

## 2017-06-23 DIAGNOSIS — R42 Dizziness and giddiness: Secondary | ICD-10-CM | POA: Diagnosis not present

## 2017-06-23 DIAGNOSIS — R531 Weakness: Secondary | ICD-10-CM | POA: Diagnosis present

## 2017-06-23 DIAGNOSIS — Z7984 Long term (current) use of oral hypoglycemic drugs: Secondary | ICD-10-CM | POA: Diagnosis not present

## 2017-06-23 LAB — URINALYSIS, ROUTINE W REFLEX MICROSCOPIC
Bilirubin Urine: NEGATIVE
Glucose, UA: NEGATIVE mg/dL
Ketones, ur: NEGATIVE mg/dL
Nitrite: NEGATIVE
PH: 6 (ref 5.0–8.0)
Protein, ur: NEGATIVE mg/dL
SPECIFIC GRAVITY, URINE: 1.01 (ref 1.005–1.030)

## 2017-06-23 LAB — CBC WITH DIFFERENTIAL/PLATELET
Basophils Absolute: 0 10*3/uL (ref 0.0–0.1)
Basophils Relative: 0 %
EOS PCT: 2 %
Eosinophils Absolute: 0.1 10*3/uL (ref 0.0–0.7)
HCT: 35.2 % — ABNORMAL LOW (ref 36.0–46.0)
Hemoglobin: 12.1 g/dL (ref 12.0–15.0)
LYMPHS ABS: 2.7 10*3/uL (ref 0.7–4.0)
LYMPHS PCT: 42 %
MCH: 30.8 pg (ref 26.0–34.0)
MCHC: 34.4 g/dL (ref 30.0–36.0)
MCV: 89.6 fL (ref 78.0–100.0)
MONO ABS: 0.6 10*3/uL (ref 0.1–1.0)
MONOS PCT: 9 %
Neutro Abs: 3 10*3/uL (ref 1.7–7.7)
Neutrophils Relative %: 47 %
PLATELETS: 186 10*3/uL (ref 150–400)
RBC: 3.93 MIL/uL (ref 3.87–5.11)
RDW: 13 % (ref 11.5–15.5)
WBC: 6.4 10*3/uL (ref 4.0–10.5)

## 2017-06-23 LAB — COMPREHENSIVE METABOLIC PANEL
ALK PHOS: 70 U/L (ref 38–126)
ALT: 21 U/L (ref 14–54)
AST: 24 U/L (ref 15–41)
Albumin: 3.7 g/dL (ref 3.5–5.0)
Anion gap: 9 (ref 5–15)
BUN: 16 mg/dL (ref 6–20)
CHLORIDE: 100 mmol/L — AB (ref 101–111)
CO2: 25 mmol/L (ref 22–32)
CREATININE: 0.96 mg/dL (ref 0.44–1.00)
Calcium: 8.7 mg/dL — ABNORMAL LOW (ref 8.9–10.3)
GFR calc Af Amer: >60 mL/min (ref >60)
GFR calc non Af Amer: 59 mL/min — ABNORMAL LOW (ref >60)
GLUCOSE: 186 mg/dL — AB (ref 65–99)
Potassium: 3.7 mmol/L (ref 3.5–5.1)
Sodium: 134 mmol/L — ABNORMAL LOW (ref 135–145)
Total Bilirubin: 0.3 mg/dL (ref 0.3–1.2)
Total Protein: 6.4 g/dL — ABNORMAL LOW (ref 6.5–8.1)

## 2017-06-23 LAB — URINALYSIS, MICROSCOPIC (REFLEX)

## 2017-06-23 LAB — TROPONIN I: Troponin I: <0.03 ng/mL (ref <0.03)

## 2017-06-23 MED ORDER — MECLIZINE HCL 25 MG PO TABS: 25.0000 mg | ORAL_TABLET | Freq: Three times a day (TID) | ORAL | 0 refills | Status: DC | PRN

## 2017-06-23 MED ORDER — SODIUM CHLORIDE 0.9 % IV BOLUS (SEPSIS)
500.0000 mL | Freq: Once | INTRAVENOUS | Status: AC
  Administered 2017-06-23: 500 mL via INTRAVENOUS

## 2017-06-23 MED ORDER — DIAZEPAM 5 MG/ML IJ SOLN
2.5000 mg | Freq: Once | INTRAMUSCULAR | Status: AC
  Administered 2017-06-23: 2.5 mg via INTRAVENOUS
  Filled 2017-06-23: qty 2

## 2017-06-23 NOTE — ED Notes (Signed)
Pt verbalizes understanding of d/c instructions and denies any further needs at this time. 

## 2017-06-23 NOTE — ED Provider Notes (Signed)
[facility] Provider Note   CSN: [id number] Arrival date & time: 06/23/17  0011     History   Chief Complaint Chief Complaint  Patient presents with  . Weakness    HPI Brooke Pitts is a 71 y.o. female.  Patient is a 71 year old female with extensive past medical history including diabetes, atrial fibrillation, prior CVA, diabetes, and hypertension. She presents today for evaluation of dizziness and weakness that started acutely at 10:30 this evening. This feeling woke her from sleep. She reports room spinning sensation associated with nausea that is worse when she attempts to sit up or turn her head. She denies any chest pain or difficulty breathing.   The history is provided by the patient.  Weakness  Primary symptoms include dizziness.  Primary symptoms include no focal weakness. This is a new problem. The current episode started 1 to 2 hours ago. The problem has not changed since onset.There was no focality noted. There has been no fever. Pertinent negatives include no shortness of breath, no altered mental status, no confusion and no headaches.    Past Medical History:  Diagnosis Date  . A-fib (HCC)   . Diabetes mellitus without complication (HCC)   . Hypertension   . Stroke [facility](HCC)     Patient Active Problem List   Diagnosis Date Noted  . S/P placement of cardiac pacemaker 11/17/2015  . Diabetes mellitus (HCC) 11/02/2015  . Atrial fibrillation (HCC) 10/18/2015  . History of CVA (cerebrovascular accident) 10/18/2015  . Long term current use of anticoagulant therapy 10/18/2015  . Essential hypertension 10/03/2015  . Hyperlipidemia LDL goal <100 10/03/2015  . Type 2 diabetes mellitus with diabetic polyneuropathy, without long-term current use of insulin (HCC) 10/03/2015    Past Surgical History:  Procedure Laterality Date  . ABDOMINAL HYSTERECTOMY    . CHOLECYSTECTOMY    . PACEMAKER INSERTION    . TONSILLECTOMY      OB History    No data  available       Home Medications    Prior to Admission medications   Medication Sig Start Date End Date Taking? Authorizing Provider  atenolol (TENORMIN) 50 MG tablet Take 50 mg by mouth daily.    [provider]  b complex vitamins tablet Take 1 tablet by mouth daily.    [provider]  Calcium Carbonate-Vitamin D (CALCIUM + D PO) Take by mouth.    [provider]  DimenhyDRINATE (DRAMAMINE PO) Take 1 tablet by mouth daily as needed.    [provider]  magnesium oxide (MAG-OX) 400 MG tablet Take 400 mg by mouth daily.    [provider]  metFORMIN (GLUCOPHAGE) 500 MG tablet Take by mouth 2 (two) times daily with a meal.    [provider]  nitroGLYCERIN (NITROSTAT) 0.4 MG SL tablet Place 1 tablet (0.4 mg total) under the tongue every 5 (five) minutes as needed for chest pain. 06/10/17 09/08/17  Revankar, Aundra Dubinajan R, MD  olmesartan-hydrochlorothiazide (BENICAR HCT) 40-25 MG tablet Take 1 tablet by mouth daily.    [provider]  Omega-3 Fatty Acids (FISH OIL) 1200 MG CPDR Take by mouth.    [provider]  prednisoLONE acetate (PRED FORTE) 1 % ophthalmic suspension 1 drop 4 (four) times daily.    [provider]  predniSONE (DELTASONE) 5 MG tablet Take 5 mg by mouth.    [provider]  ranolazine (RANEXA) 500 MG 12 hr tablet Take 500 mg by mouth 2 (two) times  daily.    [provider]  UNKNOWN TO PATIENT Another BP med    [provider]  Warfarin Sodium (COUMADIN PO) Take 5 mg by mouth daily.     [provider]    Family History Family History  Problem Relation Age of Onset  . Arrhythmia Mother   . Atrial fibrillation Mother   . Heart disease Brother   . Heart failure Brother     Social History Social History  Substance Use Topics  . Smoking status: Never Smoker  . Smokeless tobacco: Never Used  . Alcohol use No     Allergies   Cymbalta [duloxetine hcl];  Hydrocodone; Lisinopril; and Nsaids   Review of Systems Review of Systems  Respiratory: Negative for shortness of breath.   Neurological: Positive for dizziness and weakness. Negative for focal weakness and headaches.  Psychiatric/Behavioral: Negative for confusion.  All other systems reviewed and are negative.    Physical Exam Updated Vital Signs BP (!) 152/76 (BP Location: Right Arm)   Pulse 71   Temp 97.8 F (36.6 C) (Oral)   Resp 18   Ht  (1.626 m)   Wt 100.2 kg (221 lb)   SpO2 98%   BMI 37.93 kg/m   Physical Exam  Constitutional: She is oriented to person, place, and time. She appears well-developed and well-nourished. No distress.  HENT:  Head: Normocephalic and atraumatic.  TMs are clear bilaterally.  Eyes: Pupils are equal, round, and reactive to light. EOM are normal.  Neck: Normal range of motion. Neck supple.  Cardiovascular: Normal rate and regular rhythm.  Exam reveals no gallop and no friction rub.   No murmur heard. Pulmonary/Chest: Effort normal and breath sounds normal. No respiratory distress. She has no wheezes.  Abdominal: Soft. Bowel sounds are normal. She exhibits no distension. There is no tenderness.  Musculoskeletal: Normal range of motion.  Neurological: She is alert and oriented to person, place, and time. No cranial nerve deficit. She exhibits normal muscle tone. Coordination normal.  Symptoms are exacerbated by sitting forward and turning head.  Skin: Skin is warm and dry. She is not diaphoretic.  Nursing note and vitals reviewed.    ED Treatments / Results  Labs (all labs ordered are listed, but only abnormal results are displayed) Labs Reviewed  CBC WITH DIFFERENTIAL/PLATELET - Abnormal; Notable for the following:       Result Value   HCT 35.2 (*)    All other components within normal limits  COMPREHENSIVE METABOLIC PANEL  TROPONIN I  URINALYSIS, ROUTINE W REFLEX MICROSCOPIC    EKG  EKG Interpretation  Date/Time:  Tuesday  June 23 2017 00:39:15 EDT Ventricular Rate:  73 PR Interval:    QRS Duration: 91 QT Interval:  394 QTC Calculation: 426 R Axis:   -23 Text Interpretation:  Atrial-paced complexes Inferior infarct, old Probable anterior infarct, age indeterminate Confirmed by Geoffery Lyons ([id number]) on 06/23/2017 12:49:47 AM       Radiology No results found.  Procedures Procedures (including critical care time)  Medications Ordered in ED Medications  sodium chloride 0.9 % bolus 500 mL (not administered)  diazepam (VALIUM) injection 2.5 mg (not administered)     Initial Impression / Assessment and Plan / ED Course  I have reviewed the triage vital signs and the nursing notes.  Pertinent labs & imaging results that were available during my care of the patient were reviewed by me and considered in my medical decision making (see chart for details).  Patient presents here with complaints of dizziness that is most consistent with a peripheral vertigo. Her workup is unremarkable including CT scan and laboratory studies. She is feeling better after fluids and IV Valium. She will be discharged with meclizine and when necessary return.  Final Clinical Impressions(s) / ED Diagnoses   Final diagnoses:  None    New Prescriptions New Prescriptions   No medications on file     Geoffery Lyons, MD 06/23/17 [phone]

## 2017-06-23 NOTE — ED Notes (Signed)
Ambulated to restroom with minimal assistance.

## 2017-06-23 NOTE — ED Notes (Signed)
Patient transported to CT 

## 2017-06-23 NOTE — Discharge Instructions (Signed)
Meclizine as prescribed as needed for dizziness.  Return to the emergency department if you develop any additional new and concerning symptoms.

## 2017-06-23 NOTE — ED Triage Notes (Addendum)
Pt c/o  Generalized weakness x 1 hr , EMS came to home, refused transport.

## 2017-06-23 NOTE — ED Notes (Signed)
Returned from CT.

## 2017-06-25 ENCOUNTER — Ambulatory Visit (HOSPITAL_COMMUNITY): Payer: Medicare Other | Attending: Cardiovascular Disease

## 2017-06-25 ENCOUNTER — Ambulatory Visit (HOSPITAL_BASED_OUTPATIENT_CLINIC_OR_DEPARTMENT_OTHER): Payer: Medicare Other

## 2017-06-25 ENCOUNTER — Other Ambulatory Visit: Payer: Self-pay

## 2017-06-25 DIAGNOSIS — Z45018 Encounter for adjustment and management of other part of cardiac pacemaker: Secondary | ICD-10-CM

## 2017-06-25 DIAGNOSIS — Z950 Presence of cardiac pacemaker: Secondary | ICD-10-CM

## 2017-06-25 DIAGNOSIS — I4891 Unspecified atrial fibrillation: Secondary | ICD-10-CM

## 2017-06-25 DIAGNOSIS — Z7901 Long term (current) use of anticoagulants: Secondary | ICD-10-CM | POA: Diagnosis not present

## 2017-06-25 DIAGNOSIS — R079 Chest pain, unspecified: Secondary | ICD-10-CM | POA: Diagnosis not present

## 2017-06-25 DIAGNOSIS — E785 Hyperlipidemia, unspecified: Secondary | ICD-10-CM

## 2017-06-25 DIAGNOSIS — Z750 Medical services not available in home: Secondary | ICD-10-CM | POA: Diagnosis not present

## 2017-06-25 DIAGNOSIS — E1142 Type 2 diabetes mellitus with diabetic polyneuropathy: Secondary | ICD-10-CM

## 2017-06-25 DIAGNOSIS — E119 Type 2 diabetes mellitus without complications: Secondary | ICD-10-CM | POA: Insufficient documentation

## 2017-06-25 DIAGNOSIS — R011 Cardiac murmur, unspecified: Secondary | ICD-10-CM | POA: Diagnosis not present

## 2017-06-25 DIAGNOSIS — Z8673 Personal history of transient ischemic attack (TIA), and cerebral infarction without residual deficits: Secondary | ICD-10-CM

## 2017-06-25 DIAGNOSIS — I10 Essential (primary) hypertension: Secondary | ICD-10-CM

## 2017-06-25 DIAGNOSIS — R0600 Dyspnea, unspecified: Secondary | ICD-10-CM | POA: Diagnosis not present

## 2017-06-25 LAB — MYOCARDIAL PERFUSION IMAGING
CHL CUP NUCLEAR SRS: 1
LV sys vol: 20 mL
LVDIAVOL: 58 mL (ref 46–106)
NUC STRESS TID: 0.92
Peak HR: 83 {beats}/min
RATE: 0.32
Rest HR: 72 {beats}/min
SDS: 7
SSS: 8

## 2017-06-25 LAB — ECHOCARDIOGRAM COMPLETE
HEIGHTINCHES: 64 in
Weight: 3536 oz

## 2017-06-25 MED ORDER — REGADENOSON 0.4 MG/5ML IV SOLN
0.4000 mg | Freq: Once | INTRAVENOUS | Status: AC
  Administered 2017-06-25: 0.4 mg via INTRAVENOUS

## 2017-06-25 MED ORDER — TECHNETIUM TC 99M TETROFOSMIN IV KIT
10.9000 | PACK | Freq: Once | INTRAVENOUS | Status: AC | PRN
  Administered 2017-06-25: 10.9 via INTRAVENOUS
  Filled 2017-06-25: qty 11

## 2017-06-25 MED ORDER — PERFLUTREN LIPID MICROSPHERE
1.0000 mL | INTRAVENOUS | Status: AC | PRN
  Administered 2017-06-25: 2 mL via INTRAVENOUS

## 2017-06-25 MED ORDER — TECHNETIUM TC 99M TETROFOSMIN IV KIT
32.4000 | PACK | Freq: Once | INTRAVENOUS | Status: AC | PRN
Start: 2017-06-25 — End: 2017-06-25
  Administered 2017-06-25: 32.4 via INTRAVENOUS
  Filled 2017-06-25: qty 33

## 2017-07-08 ENCOUNTER — Encounter: Payer: Self-pay | Admitting: Cardiology

## 2017-07-08 ENCOUNTER — Ambulatory Visit (INDEPENDENT_AMBULATORY_CARE_PROVIDER_SITE_OTHER): Payer: Medicare Other | Admitting: Cardiology

## 2017-07-08 VITALS — BP 146/82 | HR 72 | Ht 64.0 in | Wt 225.0 lb

## 2017-07-08 DIAGNOSIS — I480 Paroxysmal atrial fibrillation: Secondary | ICD-10-CM

## 2017-07-08 DIAGNOSIS — I495 Sick sinus syndrome: Secondary | ICD-10-CM | POA: Diagnosis not present

## 2017-07-08 DIAGNOSIS — I10 Essential (primary) hypertension: Secondary | ICD-10-CM | POA: Diagnosis not present

## 2017-07-08 NOTE — Patient Instructions (Signed)
Medication Instructions:  Your physician recommends that you continue on your current medications as directed. Please refer to the Current Medication list given to you today.  -- If you need a refill on your cardiac medications before your next appointment, please call your pharmacy. --  Labwork: None ordered  Testing/Procedures: None ordered  Follow-Up: Your physician wants you to follow-up in: 1 year with Dr. Elberta Fortis.  You will receive a reminder letter in the mail two months in advance. If you don't receive a letter, please call our office to schedule the follow-up appointment.  Thank you for choosing [facility]!!   Dory Horn, RN [phone]  Any Other Special Instructions Will Be Listed Below (If Applicable). Our device clinic will contact you about enrolling in our device clinic. Their number is [phone]

## 2017-07-08 NOTE — Progress Notes (Signed)
Electrophysiology Office Note   Date:  07/08/2017   ID:  Serah, Nicoletti [birth year], MRN [id number]  PCP:  Loyal Jacobson, MD  Cardiologist:  Revankar Primary Electrophysiologist:  Norman Piacentini Jorja Loa, MD    Chief Complaint  Patient presents with  . Advice Only    AFib     History of Present Illness: Brooke Pitts is a 71 y.o. female who is being seen today for the evaluation of atrial fibrillation at the request of Loyal Jacobson, MD. Presenting today for electrophysiology evaluation. She has a history of hypertension, hyperlipidemia, and diabetes. She [facility] pacemaker [facility]. She also has atrial fibrillation and has had a stroke in the past.    Today, she denies symptoms of palpitations, chest pain, shortness of breath, orthopnea, PND, lower extremity edema, claudication, dizziness, presyncope, syncope, bleeding, or neurologic sequela. The patient is tolerating medications without difficulties.    Past Medical History:  Diagnosis Date  . A-fib (HCC)   . Diabetes mellitus without complication (HCC)   . Hypertension   . Stroke [facility])    Past Surgical History:  Procedure Laterality Date  . ABDOMINAL HYSTERECTOMY    . CHOLECYSTECTOMY    . PACEMAKER INSERTION    . TONSILLECTOMY       Current Outpatient Prescriptions  Medication Sig Dispense Refill  . atenolol (TENORMIN) 50 MG tablet Take 50 mg by mouth daily.    Marland Kitchen b complex vitamins tablet Take 1 tablet by mouth daily.    . Calcium Carbonate-Vitamin D (CALCIUM + D PO) Take by mouth.    . DimenhyDRINATE (DRAMAMINE PO) Take 1 tablet by mouth daily as needed.    . hydrochlorothiazide (HYDRODIURIL) 25 MG tablet Take 25 mg by mouth daily.    . irbesartan (AVAPRO) 300 MG tablet Take 300 mg by mouth daily.    . magnesium oxide (MAG-OX) 400 MG tablet Take 400 mg by mouth daily.    . metFORMIN (GLUCOPHAGE) 500 MG tablet Take by mouth 2 (two) times daily with a meal.    . nitroGLYCERIN  (NITROSTAT) 0.4 MG SL tablet Place 1 tablet (0.4 mg total) under the tongue every 5 (five) minutes as needed for chest pain. 30 tablet 6  . Omega-3 Fatty Acids (FISH OIL) 1200 MG CPDR Take by mouth.    . ranolazine (RANEXA) 500 MG 12 hr tablet Take 500 mg by mouth 2 (two) times daily.    Marland Kitchen UNKNOWN TO PATIENT Another BP med    . Warfarin Sodium (COUMADIN PO) Take 5 mg by mouth daily.      No current facility-administered medications for this visit.     Allergies:   Cymbalta [duloxetine hcl]; Hydrocodone; Lisinopril; and Nsaids   Social History:  The patient  reports that she has never smoked. She has never used smokeless tobacco. She reports that she does not drink alcohol or use drugs.   Family History:  The patient's family history includes Arrhythmia in her mother; Atrial fibrillation in her mother; Heart disease in her brother; Heart failure in her brother.    ROS:  Please see the history of present illness.   Otherwise, review of systems is positive for none.   All other systems are reviewed and negative.    PHYSICAL EXAM: VS:  BP (!) 146/82   Pulse 72   Ht  (1.626 m)   Wt 225 lb (102.1 kg)   SpO2 97%   BMI 38.62 kg/m  , BMI Body  mass index is 38.62 kg/m. GEN: Well nourished, well developed, in no acute distress  HEENT: normal  Neck: no JVD, carotid bruits, or masses Cardiac: RRR; no murmurs, rubs, or gallops,no edema  Respiratory:  clear to auscultation bilaterally, normal work of breathing GI: soft, nontender, nondistended, + BS MS: no deformity or atrophy  Skin: warm and dry, device pocket is well healed Neuro:  Strength and sensation are intact Psych: euthymic mood, full affect  EKG:  EKG is not ordered today. Personal review of the ekg ordered 06/23/17 shows atrial paced, inferior Q waves, rate 73  Device interrogation is reviewed today in detail.  See PaceArt for details.   Recent Labs: 06/23/2017: ALT 21; BUN 16; Creatinine, Ser 0.96; Hemoglobin 12.1;  Platelets 186; Potassium 3.7; Sodium 134    Lipid Panel  No results found for: CHOL, TRIG, HDL, CHOLHDL, VLDL, LDLCALC, LDLDIRECT   Wt Readings from Last 3 Encounters:  07/08/17 225 lb (102.1 kg)  06/25/17 221 lb (100.2 kg)  06/23/17 221 lb (100.2 kg)      Other studies Reviewed: Additional studies/ records that were reviewed today include: TTE 9/*13/18  Review of the above records today demonstrates:  - Left ventricle: The cavity size was normal. Wall thickness was   normal. Systolic function was normal. The estimated ejection   fraction was in the range of 55% to 60%. Doppler parameters are   consistent with abnormal left ventricular relaxation (grade 1   diastolic dysfunction). - Aortic valve: There was trivial regurgitation. - Atrial septum: No defect or patent foramen ovale was identified.  Myoview 9/*13/18  Nuclear stress EF: 65%.  This is a low risk study.  The left ventricular ejection fraction is normal (55-65%).  Normal study. No evidence of ischemia or previous infarction .   ASSESSMENT AND PLAN:  1.  Paroxysmal atrial fibrillation: Currently on Coumadin for anticoagulation. She just refilled her prescription and has no interest at this time in switching. She says that she may have an interest once her prescription runs out. Atrially paced today. Minimal atrial fibrillation noted on device interrogation. No changes.  This patients CHA2DS2-VASc Score and unadjusted Ischemic Stroke Rate (% per year) is equal to 3.2 % stroke rate/year from a score of 3  Above score calculated as 1 point each if present [CHF, HTN, DM, Vascular=MI/PAD/Aortic Plaque, Age if 65-74, or Female] Above score calculated as 2 points each if present [Age > 75, or Stroke/TIA/TE]   2. Hypertension: mildly elevated today, though on good medical management. No changes at this time.  3. Sick sinus syndrome: Status post Medtronic dual chamber pacemaker. functioning appropriately. No  changes.    Current medicines are reviewed at length with the patient today.   The patient does not have concerns regarding her medicines.  The following changes were made today:  none  Labs/ tests ordered today include:  No orders of the defined types were placed in this encounter.    Disposition:   FU with Lavoy Bernards 1 year  Signed, Sheridan Hew Jorja Loa, MD  07/08/2017 3:21 PM     [facility] [address] [place] [address] [phone] (office) [phone] (fax)

## 2017-07-09 ENCOUNTER — Telehealth: Payer: Self-pay

## 2017-07-13 ENCOUNTER — Ambulatory Visit: Admitting: Cardiology

## 2017-07-13 NOTE — Telephone Encounter (Signed)
Follow Up:; ° ° °Returning your call. °

## 2017-07-13 NOTE — Telephone Encounter (Signed)
Patient returning your call.

## 2017-07-16 ENCOUNTER — Ambulatory Visit (INDEPENDENT_AMBULATORY_CARE_PROVIDER_SITE_OTHER): Payer: Medicare Other | Admitting: Cardiology

## 2017-07-16 ENCOUNTER — Encounter: Payer: Self-pay | Admitting: Cardiology

## 2017-07-16 VITALS — BP 134/80 | HR 72 | Ht 65.0 in | Wt 223.1 lb

## 2017-07-16 DIAGNOSIS — I10 Essential (primary) hypertension: Secondary | ICD-10-CM

## 2017-07-16 DIAGNOSIS — Z950 Presence of cardiac pacemaker: Secondary | ICD-10-CM | POA: Diagnosis not present

## 2017-07-16 DIAGNOSIS — Z7901 Long term (current) use of anticoagulants: Secondary | ICD-10-CM | POA: Diagnosis not present

## 2017-07-16 DIAGNOSIS — E1142 Type 2 diabetes mellitus with diabetic polyneuropathy: Secondary | ICD-10-CM

## 2017-07-16 DIAGNOSIS — Z8673 Personal history of transient ischemic attack (TIA), and cerebral infarction without residual deficits: Secondary | ICD-10-CM | POA: Diagnosis not present

## 2017-07-16 DIAGNOSIS — E785 Hyperlipidemia, unspecified: Secondary | ICD-10-CM | POA: Diagnosis not present

## 2017-07-16 DIAGNOSIS — I480 Paroxysmal atrial fibrillation: Secondary | ICD-10-CM

## 2017-07-16 LAB — POCT INR: INR: 1.9

## 2017-07-16 MED ORDER — APIXABAN 2.5 MG PO TABS
5.0000 mg | ORAL_TABLET | Freq: Two times a day (BID) | ORAL | Status: DC
Start: 2017-07-16 — End: 2017-07-16

## 2017-07-16 MED ORDER — APIXABAN 2.5 MG PO TABS: 2.5000 mg | ORAL_TABLET | Freq: Two times a day (BID) | ORAL | 3 refills | Status: DC

## 2017-07-16 NOTE — Progress Notes (Signed)
Cardiology Office Note:    Date:  07/16/2017   ID:  CHRISANNE LOOSE, DOB Jan 11, 1946, MRN [id number]  PCP:  Loyal Jacobson, MD  Cardiologist:  Garwin Brothers, MD   Referring MD: Loyal Jacobson, MD    ASSESSMENT:    1. Paroxysmal atrial fibrillation (HCC)   2. Essential hypertension   3. Type 2 diabetes mellitus with diabetic polyneuropathy, without long-term current use of insulin (HCC)   4. History of CVA (cerebrovascular accident)   5. Hyperlipidemia LDL goal <100   6. Long term current use of anticoagulant therapy   7. S/P placement of cardiac pacemaker    PLAN:    In order of problems listed above:  1. I discussed my findings with the patient at extensive length. New anticoagulants were discussed, benefits and potential this were explained and she verbalized understanding. She is agreeable. We will check a pro time today and accordingly we'll initiate her with samples of eliquis 5 mg twice a day.prescription also will be given. 2. As I mentioned earlier and not clear why she was on Ranexa. Also this is expensive for her and she prefers to quit at the apex is no strong indication and I told her that she could go off it at this time. 3. Diet was discussed for essential hypertension, dyslipidemia and obesity. Risks of obesity explained and she verbalized understanding. Lipids are followed by her primary care physician. She will be seen in follow-up appointment in 6 months or earlier if she has any concerns.   Medication Adjustments/Labs and Tests Ordered: Current medicines are reviewed at length with the patient today.  Concerns regarding medicines are outlined above.  Orders Placed This Encounter  Procedures  . POCT INR   No orders of the defined types were placed in this encounter.    Chief Complaint  Patient presents with  . Follow-up    No Concerns      History of Present Illness:    JAKELIN TAUSSIG is a 71 y.o. female . She was evaluated by and she wanted  to get established. With me for the management of paroxysmal atrial fibrillation. She denies any problems at this time and takes care of activities of daily living. No chest pain orthopnea or PND.She has undergone stress testing which has been unremarkable. Here for pacemaker check and evaluation by our electrophysiologist has revealed that pacemaker functioning satisfactorily and she is in atrial fibrillation for a very short percent of the time.  Past Medical History:  Diagnosis Date  . A-fib (HCC)   . Diabetes mellitus without complication (HCC)   . Hypertension   . Stroke [facility])     Past Surgical History:  Procedure Laterality Date  . ABDOMINAL HYSTERECTOMY    . CHOLECYSTECTOMY    . PACEMAKER INSERTION    . TONSILLECTOMY      Current Medications: Current Meds  Medication Sig  . atenolol (TENORMIN) 50 MG tablet Take 50 mg by mouth daily.  Marland Kitchen b complex vitamins tablet Take 1 tablet by mouth daily.  . Calcium Carbonate-Vitamin D (CALCIUM + D PO) Take by mouth.  . DimenhyDRINATE (DRAMAMINE PO) Take 1 tablet by mouth daily as needed.  . hydrochlorothiazide (HYDRODIURIL) 25 MG tablet Take 25 mg by mouth daily.  . irbesartan (AVAPRO) 300 MG tablet Take 300 mg by mouth daily.  . magnesium oxide (MAG-OX) 400 MG tablet Take 400 mg by mouth daily.  . metFORMIN (GLUCOPHAGE) 500 MG tablet Take by mouth 2 (two) times daily  with a meal.  . nitroGLYCERIN (NITROSTAT) 0.4 MG SL tablet Place 1 tablet (0.4 mg total) under the tongue every 5 (five) minutes as needed for chest pain.  . Omega-3 Fatty Acids (FISH OIL) 1200 MG CPDR Take by mouth.  Marland Kitchen UNKNOWN TO PATIENT Another BP med  . Warfarin Sodium (COUMADIN PO) Take 5 mg by mouth daily.   . [DISCONTINUED] ranolazine (RANEXA) 500 MG 12 hr tablet Take 500 mg by mouth 2 (two) times daily.     Allergies:   Cymbalta [duloxetine hcl]; Hydrocodone; Lisinopril; and Nsaids   Social History   Social History  . Marital status: Married    Spouse name: N/A   . Number of children: N/A  . Years of education: N/A   Social History Main Topics  . Smoking status: Never Smoker  . Smokeless tobacco: Never Used  . Alcohol use No  . Drug use: No  . Sexual activity: Not Asked   Other Topics Concern  . None   Social History Narrative  . None     Family History: The patient's family history includes Arrhythmia in her mother; Atrial fibrillation in her mother; Heart disease in her brother; Heart failure in her brother.  ROS:   Please see the history of present illness.    All other systems reviewed and are negative.  EKGs/Labs/Other Studies Reviewed:    The following studies were reviewed today: I reviewed the findings of electrophysiology evaluation and pacemaker check extensively with the patient and she verbalized understanding.   Recent Labs: 06/23/2017: ALT 21; BUN 16; Creatinine, Ser 0.96; Hemoglobin 12.1; Platelets 186; Potassium 3.7; Sodium 134  Recent Lipid Panel No results found for: CHOL, TRIG, HDL, CHOLHDL, VLDL, LDLCALC, LDLDIRECT  Physical Exam:    VS:  BP 134/80   Pulse 72   Ht  (1.651 m)   Wt 223 lb 1.9 oz (101.2 kg)   SpO2 98%   BMI 37.13 kg/m     Wt Readings from Last 3 Encounters:  07/16/17 223 lb 1.9 oz (101.2 kg)  07/08/17 225 lb (102.1 kg)  06/25/17 221 lb (100.2 kg)     GEN: Patient is in no acute distress HEENT: Normal NECK: No JVD; No carotid bruits LYMPHATICS: No lymphadenopathy CARDIAC: Hear sounds regular, 2/6 systolic murmur at the apex. RESPIRATORY:  Clear to auscultation without rales, wheezing or rhonchi  ABDOMEN: Soft, non-tender, non-distended MUSCULOSKELETAL:  No edema; No deformity  SKIN: Warm and dry NEUROLOGIC:  Alert and oriented x 3 PSYCHIATRIC:  Normal affect   Signed, Garwin Brothers, MD  07/16/2017 9:10 AM    [facility]

## 2017-07-16 NOTE — Patient Instructions (Signed)
Medication Instructions:  Your physician has recommended you make the following change in your medication: START Eliquis  BID STOP Ranexa and Coumadin  Labwork: None  Testing/Procedures: You had your PT/INR checked today  Follow-Up: 6 months  Any Other Special Instructions Will Be Listed Below (If Applicable).     If you need a refill on your cardiac medications before your next appointment, please call your pharmacy.

## 2017-07-21 NOTE — Telephone Encounter (Signed)
Spoke with patient and explained that I have requested her remote monitoring in carelink from her previous clinic. She verbalized understanding. I explained that I have her next check schedule for 10/15/2017 - she confirmed date.

## 2017-10-15 ENCOUNTER — Ambulatory Visit (INDEPENDENT_AMBULATORY_CARE_PROVIDER_SITE_OTHER): Payer: Medicare Other | Admitting: *Deleted

## 2017-10-15 DIAGNOSIS — I495 Sick sinus syndrome: Secondary | ICD-10-CM | POA: Diagnosis not present

## 2017-10-16 ENCOUNTER — Encounter: Payer: Self-pay | Admitting: Cardiology

## 2017-10-16 ENCOUNTER — Telehealth: Payer: Self-pay | Admitting: Cardiology

## 2017-10-16 ENCOUNTER — Other Ambulatory Visit: Payer: Self-pay

## 2017-10-16 DIAGNOSIS — I480 Paroxysmal atrial fibrillation: Secondary | ICD-10-CM

## 2017-10-16 MED ORDER — APIXABAN 2.5 MG PO TABS: 2.5000 mg | ORAL_TABLET | Freq: Two times a day (BID) | ORAL | 1 refills | Status: DC

## 2017-10-16 NOTE — Telephone Encounter (Signed)
Med refill sent

## 2017-10-16 NOTE — Telephone Encounter (Signed)
Call eliquis to [facility] on [address] in [place]

## 2017-10-16 NOTE — Progress Notes (Signed)
Remote pacemaker transmission.   

## 2017-10-27 LAB — CUP PACEART REMOTE DEVICE CHECK
Battery Remaining Longevity: 82 mo
Battery Voltage: 3.01 V
Brady Statistic AP VP Percent: 0.03 %
Brady Statistic AS VS Percent: 13.83 %
Brady Statistic RA Percent Paced: 85.56 %
Brady Statistic RV Percent Paced: 0.04 %
Date Time Interrogation Session: 20190103162721
Implantable Pulse Generator Implant Date: 20150114
Lead Channel Impedance Value: 494 Ohm
Lead Channel Impedance Value: 608 Ohm
Lead Channel Pacing Threshold Amplitude: 0.75 V
Lead Channel Pacing Threshold Amplitude: 0.875 V
Lead Channel Pacing Threshold Pulse Width: 0.4 ms
Lead Channel Sensing Intrinsic Amplitude: 2 mV
Lead Channel Setting Pacing Amplitude: 1.75 V
Lead Channel Setting Sensing Sensitivity: 0.9 mV
MDC IDC LEAD LOCATION: 753859
MDC IDC LEAD LOCATION: 753860
MDC IDC MSMT LEADCHNL RA IMPEDANCE VALUE: 342 Ohm
MDC IDC MSMT LEADCHNL RA IMPEDANCE VALUE: 437 Ohm
MDC IDC MSMT LEADCHNL RA SENSING INTR AMPL: 2 mV
MDC IDC MSMT LEADCHNL RV PACING THRESHOLD PULSEWIDTH: 0.4 ms
MDC IDC MSMT LEADCHNL RV SENSING INTR AMPL: 16 mV
MDC IDC MSMT LEADCHNL RV SENSING INTR AMPL: 16 mV
MDC IDC SET LEADCHNL RV PACING AMPLITUDE: 2 V
MDC IDC SET LEADCHNL RV PACING PULSEWIDTH: 0.4 ms
MDC IDC STAT BRADY AP VS PERCENT: 86.14 %
MDC IDC STAT BRADY AS VP PERCENT: 0 %

## 2017-12-05 ENCOUNTER — Emergency Department (HOSPITAL_BASED_OUTPATIENT_CLINIC_OR_DEPARTMENT_OTHER)
Admission: EM | Admit: 2017-12-05 | Discharge: 2017-12-05 | Disposition: A | Discharge status: Home / Self Care | Payer: Medicare Other | Attending: Emergency Medicine | Admitting: Emergency Medicine

## 2017-12-05 ENCOUNTER — Emergency Department (HOSPITAL_BASED_OUTPATIENT_CLINIC_OR_DEPARTMENT_OTHER): Payer: Medicare Other

## 2017-12-05 ENCOUNTER — Other Ambulatory Visit: Payer: Self-pay

## 2017-12-05 ENCOUNTER — Encounter (HOSPITAL_BASED_OUTPATIENT_CLINIC_OR_DEPARTMENT_OTHER): Payer: Self-pay | Admitting: Emergency Medicine

## 2017-12-05 DIAGNOSIS — I10 Essential (primary) hypertension: Secondary | ICD-10-CM | POA: Insufficient documentation

## 2017-12-05 DIAGNOSIS — E1142 Type 2 diabetes mellitus with diabetic polyneuropathy: Secondary | ICD-10-CM | POA: Insufficient documentation

## 2017-12-05 DIAGNOSIS — Z950 Presence of cardiac pacemaker: Secondary | ICD-10-CM | POA: Diagnosis not present

## 2017-12-05 DIAGNOSIS — Z7901 Long term (current) use of anticoagulants: Secondary | ICD-10-CM | POA: Insufficient documentation

## 2017-12-05 DIAGNOSIS — R109 Unspecified abdominal pain: Secondary | ICD-10-CM

## 2017-12-05 DIAGNOSIS — R1031 Right lower quadrant pain: Secondary | ICD-10-CM | POA: Insufficient documentation

## 2017-12-05 DIAGNOSIS — Z79899 Other long term (current) drug therapy: Secondary | ICD-10-CM | POA: Diagnosis not present

## 2017-12-05 LAB — URINALYSIS, MICROSCOPIC (REFLEX)

## 2017-12-05 LAB — CBC WITH DIFFERENTIAL/PLATELET
BASOS PCT: 0 %
Basophils Absolute: 0 10*3/uL (ref 0.0–0.1)
Eosinophils Absolute: 0.1 10*3/uL (ref 0.0–0.7)
Eosinophils Relative: 2 %
HEMATOCRIT: 37.5 % (ref 36.0–46.0)
Hemoglobin: 12.6 g/dL (ref 12.0–15.0)
Lymphocytes Relative: 41 %
Lymphs Abs: 2.4 10*3/uL (ref 0.7–4.0)
MCH: 30.1 pg (ref 26.0–34.0)
MCHC: 33.6 g/dL (ref 30.0–36.0)
MCV: 89.5 fL (ref 78.0–100.0)
MONO ABS: 0.4 10*3/uL (ref 0.1–1.0)
MONOS PCT: 7 %
Neutro Abs: 3 10*3/uL (ref 1.7–7.7)
Neutrophils Relative %: 50 %
Platelets: 208 10*3/uL (ref 150–400)
RBC: 4.19 MIL/uL (ref 3.87–5.11)
RDW: 12.9 % (ref 11.5–15.5)
WBC: 5.8 10*3/uL (ref 4.0–10.5)

## 2017-12-05 LAB — COMPREHENSIVE METABOLIC PANEL
ALBUMIN: 3.9 g/dL (ref 3.5–5.0)
ALK PHOS: 66 U/L (ref 38–126)
ALT: 20 U/L (ref 14–54)
ANION GAP: 10 (ref 5–15)
AST: 22 U/L (ref 15–41)
BUN: 16 mg/dL (ref 6–20)
CALCIUM: 8.9 mg/dL (ref 8.9–10.3)
CO2: 26 mmol/L (ref 22–32)
Chloride: 101 mmol/L (ref 101–111)
Creatinine, Ser: 0.75 mg/dL (ref 0.44–1.00)
GFR calc Af Amer: >60 mL/min (ref >60)
GFR calc non Af Amer: >60 mL/min (ref >60)
GLUCOSE: 178 mg/dL — AB (ref 65–99)
Potassium: 3.8 mmol/L (ref 3.5–5.1)
SODIUM: 137 mmol/L (ref 135–145)
Total Bilirubin: 0.5 mg/dL (ref 0.3–1.2)
Total Protein: 6.8 g/dL (ref 6.5–8.1)

## 2017-12-05 LAB — URINALYSIS, ROUTINE W REFLEX MICROSCOPIC
BILIRUBIN URINE: NEGATIVE
GLUCOSE, UA: NEGATIVE mg/dL
KETONES UR: NEGATIVE mg/dL
Leukocytes, UA: NEGATIVE
Nitrite: NEGATIVE
Protein, ur: NEGATIVE mg/dL
Specific Gravity, Urine: 1.02 (ref 1.005–1.030)
pH: 6 (ref 5.0–8.0)

## 2017-12-05 LAB — LIPASE, BLOOD: Lipase: 41 U/L (ref 11–51)

## 2017-12-05 MED ORDER — IOPAMIDOL (ISOVUE-300) INJECTION 61%
100.0000 mL | Freq: Once | INTRAVENOUS | Status: AC | PRN
  Administered 2017-12-05: 100 mL via INTRAVENOUS

## 2017-12-05 MED ORDER — CYCLOBENZAPRINE HCL 10 MG PO TABS: 5.0000 mg | ORAL_TABLET | Freq: Two times a day (BID) | ORAL | 0 refills | Status: DC | PRN

## 2017-12-05 NOTE — ED Provider Notes (Signed)
[facility] EMERGENCY DEPARTMENT Provider Note   CSN: [id number] Arrival date & time: 12/05/17  0909     History   Chief Complaint Chief Complaint  Patient presents with  . Abdominal Pain    HPI Brooke Pitts is a 72 y.o. female with a past medical history of diabetes, hypertension, who presents to ED for evaluation of 1-1/2-week history of right lower quadrant abdominal pain radiating to right flank and right side of her back.  Describes the pain as a constant "aching" with intermittent "sharp pain."  She was seen and evaluated by the provider at her PCPs office 1 week ago and underwent an abdominal ultrasound with no acute abnormalities, mostly to evaluate for renal or pancreatic abnormalities.  She states that since then the pain has been constant and associated with nausea.  She has tried a few doses of Tylenol yesterday with no improvement in her symptoms.  Symptoms first began with diffuse body aches which she thought was related to restarting her Januvia approximately 4 weeks ago.  Since then her body aches have improved but she continues to have abdominal pain.  She was told that she had hematuria present in her urine office visit last week but no signs of UTI.  Prior abdominal surgeries include hysterectomy in the 1970s and cholecystectomy in the 1980s.  Cannot recall any history of nephrolithiasis in the past.  Denies any vomiting, diarrhea, constipation, dysuria, gross hematuria, abnormal vaginal bleeding, fevers, chest pain.  HPI  Past Medical History:  Diagnosis Date  . A-fib (HCC)   . Diabetes mellitus without complication (HCC)   . Hypertension   . Stroke [facility])     Patient Active Problem List   Diagnosis Date Noted  . S/P placement of cardiac pacemaker 11/17/2015  . Diabetes mellitus (HCC) 11/02/2015  . Atrial fibrillation (HCC) 10/18/2015  . History of CVA (cerebrovascular accident) 10/18/2015  . Long term current use of anticoagulant therapy  10/18/2015  . Essential hypertension 10/03/2015  . Hyperlipidemia LDL goal <100 10/03/2015  . Type 2 diabetes mellitus with diabetic polyneuropathy, without long-term current use of insulin (HCC) 10/03/2015    Past Surgical History:  Procedure Laterality Date  . ABDOMINAL HYSTERECTOMY    . CHOLECYSTECTOMY    . PACEMAKER INSERTION    . TONSILLECTOMY      OB History    No data available       Home Medications    Prior to Admission medications   Medication Sig Start Date End Date Taking? Authorizing Provider  apixaban (ELIQUIS) 2.5 MG TABS tablet Take 1 tablet (2.5 mg total) by mouth 2 (two) times daily. 10/16/17  Yes Revankar, Aundra Dubin, MD  atenolol (TENORMIN) 50 MG tablet Take 50 mg by mouth daily.   Yes [provider]  hydrochlorothiazide (HYDRODIURIL) 25 MG tablet Take 25 mg by mouth daily. 06/18/17  Yes [provider]  irbesartan (AVAPRO) 300 MG tablet Take 300 mg by mouth daily. 06/18/17  Yes [provider]  metFORMIN (GLUCOPHAGE) 500 MG tablet Take by mouth 2 (two) times daily with a meal.   Yes [provider]  b complex vitamins tablet Take 1 tablet by mouth daily.    [provider]  Calcium Carbonate-Vitamin D (CALCIUM + D PO) Take by mouth.    [provider]  cyclobenzaprine (FLEXERIL) 10 MG tablet Take 0.5 tablets (5 mg total) by mouth 2 (two) times daily as needed for muscle spasms. 12/05/17   Dietrich Pates, PA-C  DimenhyDRINATE (DRAMAMINE PO) Take 1 tablet by mouth daily as needed.    [provider]  magnesium oxide (MAG-OX) 400 MG tablet Take 400 mg by mouth daily.    [provider]  nitroGLYCERIN (NITROSTAT) 0.4 MG SL tablet Place 1 tablet (0.4 mg total) under the tongue every 5 (five) minutes as needed for chest pain. 06/10/17 09/08/17  Revankar, Aundra Dubin, MD  Omega-3 Fatty Acids (FISH OIL) 1200 MG CPDR Take by mouth.    [provider]  UNKNOWN TO PATIENT Another BP med    [provider]    Family History Family History  Problem Relation Age of Onset  . Arrhythmia Mother   . Atrial fibrillation Mother   . Heart disease Brother   . Heart failure Brother     Social History Social History   Tobacco Use  . Smoking status: Never Smoker  . Smokeless tobacco: Never Used  Substance Use Topics  . Alcohol use: No  . Drug use: No     Allergies   Cymbalta [duloxetine hcl]; Hydrocodone; Lisinopril; and Nsaids   Review of Systems Review of Systems  Constitutional: Negative for appetite change, chills and fever.  HENT: Negative for ear pain, rhinorrhea, sneezing and sore throat.   Eyes: Negative for photophobia and visual disturbance.  Respiratory: Negative for cough, chest tightness, shortness of breath and wheezing.   Cardiovascular: Negative for chest pain and palpitations.  Gastrointestinal: Positive for abdominal pain and nausea. Negative for blood in stool, constipation, diarrhea and vomiting.  Genitourinary: Positive for flank pain and hematuria. Negative for dysuria, urgency and vaginal bleeding.  Musculoskeletal: Negative for myalgias.  Skin: Negative for rash.  Neurological: Negative for dizziness, weakness and light-headedness.     Physical Exam Updated Vital Signs BP (!) 152/69 (BP Location: Right Arm)   Pulse 72   Temp 98.1 F (36.7 C) (Oral)   Resp 18   Ht 5\' 5"  (1.651 m)   Wt 103 kg (227 lb)   SpO2 100%   BMI 37.77 kg/m   Physical Exam  Constitutional: She appears well-developed and well-nourished. No distress.  Nontoxic appearing and in no acute distress.  HENT:  Head: Normocephalic and atraumatic.  Nose: Nose normal.  Eyes: Conjunctivae and EOM are normal. Right eye exhibits no discharge. Left eye exhibits no discharge. No scleral icterus.  Neck: Normal range of motion. Neck supple.  Cardiovascular: Normal rate, regular rhythm, normal heart sounds and intact distal pulses. Exam reveals no gallop and no friction rub.    No murmur heard. Pulmonary/Chest: Effort normal and breath sounds normal. No respiratory distress.  Abdominal: Soft. Bowel sounds are normal. She exhibits no distension. There is tenderness. There is no rebound and no guarding.    No CVA tenderness bilaterally.  Musculoskeletal: Normal range of motion. She exhibits no edema.  Neurological: She is alert. She exhibits normal muscle tone. Coordination normal.  Skin: Skin is warm and dry. No rash noted.  Psychiatric: She has a normal mood and affect.  Nursing note and vitals reviewed.    ED Treatments / Results  Labs (all labs ordered are listed, but only abnormal results are displayed) Labs Reviewed  COMPREHENSIVE METABOLIC PANEL - Abnormal; Notable for the following components:      Result Value   Glucose, Bld 178 (*)    All other components within normal limits  URINALYSIS, ROUTINE W REFLEX MICROSCOPIC - Abnormal; Notable for the following components:   Hgb urine dipstick TRACE (*)  All other components within normal limits  URINALYSIS, MICROSCOPIC (REFLEX) - Abnormal; Notable for the following components:   Bacteria, UA FEW (*)    Squamous Epithelial / LPF 0-5 (*)    All other components within normal limits  CBC WITH DIFFERENTIAL/PLATELET  LIPASE, BLOOD    EKG  EKG Interpretation None       Radiology Ct Abdomen Pelvis W Contrast  Result Date: 12/05/2017 CLINICAL DATA:  Right lower quadrant abdominal pain for the past week and a half. EXAM: CT ABDOMEN AND PELVIS WITH CONTRAST TECHNIQUE: Multidetector CT imaging of the abdomen and pelvis was performed using the standard protocol following bolus administration of intravenous contrast. CONTRAST:  ISOVUE-300 IOPAMIDOL (ISOVUE-300) INJECTION 61% COMPARISON:  CT chest, abdomen, and pelvis dated October 25, 2013. FINDINGS: Lower chest: No acute abnormality. Hepatobiliary: Hepatic steatosis. Prior cholecystectomy. Minimal extra- and intrahepatic biliary dilatation is  likely due to post cholecystectomy state. Pancreas: Unremarkable. No pancreatic ductal dilatation or surrounding inflammatory changes. Spleen: Normal in size without focal abnormality. Adrenals/Urinary Tract: Adrenal glands are unremarkable. Slight interval increase in size of a 1.2 cm simple cyst in the upper pole of the right kidney. Kidneys are otherwise normal, without renal calculi, focal lesion, or hydronephrosis. Bladder is unremarkable. Stomach/Bowel: Stomach is within normal limits. Appendix appears normal. No evidence of bowel wall thickening, distention, or inflammatory changes. Mild left-sided colonic diverticulosis. Vascular/Lymphatic: Minimal aortic atherosclerosis. No enlarged abdominal or pelvic lymph nodes. Reproductive: Status post hysterectomy. Interval increase in size of the simple appearing cystic lesion in the right ovary, now measuring 2.8 x 2.9 x 3.7 cm, previously 2.3 x 2.3 x 2.8 cm. Other: No abdominal wall hernia or abnormality. No abdominopelvic ascites. No pneumoperitoneum. Musculoskeletal: No acute or significant osseous findings. IMPRESSION: 1.  No acute intra-abdominal process. 2. Hepatic steatosis. 3. Slight interval increase in size of a benign-appearing cyst in the right ovary, now measuring 3.7 cm. Given its size, non-emergent pelvic ultrasound is recommended. This recommendation follows ACR consensus guidelines: White Paper of the ACR Incidental Findings Committee II on Adnexal Findings. J Am Coll Radiol [phone]. Electronically Signed   By: Obie Dredge M.D.   On: 12/05/2017 11:51    Procedures Procedures (including critical care time)  Medications Ordered in ED Medications  iopamidol (ISOVUE-300) 61 % injection 100 mL (100 mLs Intravenous Contrast Given 12/05/17 1119)     Initial Impression / Assessment and Plan / ED Course  I have reviewed the triage vital signs and the nursing notes.  Pertinent labs & imaging results that were available during my care  of the patient were reviewed by me and considered in my medical decision making (see chart for details).     Patient presents to ED for evaluation of 1-1/2-week history of right lower quadrant abdominal pain radiating to right flank and inguinal area.  She was seen and evaluated at her PCP office by another provider approximately 1 week ago and underwent an abdominal ultrasound with no acute abnormalities to evaluate for renal pancreatic processes.  She was sent here for no improvement in her symptoms.  On physical exam she is overall well-appearing.  She does have some tenderness to palpation of the abdomen as indicated in the image but no rebound, guarding.  She is afebrile with no use of antipyretics.  CBC, CMP, lipase, urinalysis unremarkable.  I reviewed the ultrasound findings which did show a simple renal cyst but no other abnormality.  CT of the abdomen and pelvis was ordered which showed  no acute process or signs of appendicitis.  It did show incidental finding of cyst on right ovary.  I suspect that her symptoms could be musculoskeletal in nature.  Will give muscle relaxer, advised Tylenol and heat as needed and advised her to follow-up with her primary care provider for further evaluation or any further imaging.  Patient appears stable for discharge at this time.  Strict return precautions given. Patient discussed with and seen by Dr. Donnald GarrePfeiffer.  Portions of this note were generated with Scientist, clinical (histocompatibility and immunogenetics)Dragon dictation software. Dictation errors may occur despite best attempts at proofreading.   Final Clinical Impressions(s) / ED Diagnoses   Final diagnoses:  Abdominal wall pain    ED Discharge Orders        Ordered    cyclobenzaprine (FLEXERIL) 10 MG tablet  2 times daily PRN     12/05/17 1230       Dietrich PatesKhatri, Mustaf Antonacci, PA-C 12/05/17 1234    Arby BarrettePfeiffer, Marcy, MD 12/05/17 1510

## 2017-12-05 NOTE — ED Provider Notes (Signed)
Medical screening examination/treatment/procedure(s) were conducted as a shared visit with non-physician practitioner(s) and myself.  I personally evaluated the patient during the encounter.   EKG Interpretation None     Patient has had right lower quadrant pain for about a week and a half.  She does indicate her very lower abdomen, almost inguinal area.  He cannot identify significantly what makes it better or worse.  On examination patient is clinically well in appearance.  Abdomen is soft without guarding.  Patient skin condition is good there are no rashes or lesions within skin folds.  Pain seems to localize somewhat to the inguinal ligament area.  Diagnostic workup is reassuring.  No positive findings on CT scan.  At this time suggest starting with treatment for musculoskeletal pain with Tylenol, Norflex and warm moist heat.  Recommendations for continued close follow-up with PCP and monitoring for response to treatment.  Further diagnostic studies as outpatient if needed.   Arby BarrettePfeiffer, Aarin Sparkman, MD 12/05/17 1220

## 2017-12-05 NOTE — ED Triage Notes (Addendum)
Pt reports RLQ abd pain x 1.5 weeks. Seen by PCP with neg US last week. Told to come to ED if pain persists. Denies N/V/D, denies urinary symptoms.

## 2017-12-05 NOTE — Discharge Instructions (Signed)
Please read attached information regarding your condition. Take Flexeril and Tylenol to help with pain. Apply heating pad as needed for comfort. Follow-up with your primary care provider for further evaluation. Return to ED for worsening symptoms, severe abdominal pain with vomiting, fevers, blood in stool.

## 2017-12-05 NOTE — ED Notes (Signed)
Pt unable to void at this time. 

## 2017-12-05 NOTE — ED Notes (Signed)
CT will wait for bun/creat to result prior to imaging with IV contrast, per [place] radiology protocol, pt > 60 yrs of age and hx DM

## 2017-12-05 NOTE — ED Notes (Signed)
Patient transported to CT 

## 2018-01-14 ENCOUNTER — Ambulatory Visit (INDEPENDENT_AMBULATORY_CARE_PROVIDER_SITE_OTHER): Payer: Medicare Other | Admitting: *Deleted

## 2018-01-14 DIAGNOSIS — I495 Sick sinus syndrome: Secondary | ICD-10-CM

## 2018-01-14 NOTE — Progress Notes (Signed)
Remote pacemaker transmission.   

## 2018-01-20 ENCOUNTER — Encounter: Payer: Self-pay | Admitting: Cardiology

## 2018-01-20 ENCOUNTER — Encounter: Payer: Self-pay | Admitting: Obstetrics & Gynecology

## 2018-01-20 ENCOUNTER — Ambulatory Visit (INDEPENDENT_AMBULATORY_CARE_PROVIDER_SITE_OTHER): Payer: Medicare Other | Admitting: Obstetrics & Gynecology

## 2018-01-20 ENCOUNTER — Ambulatory Visit (HOSPITAL_BASED_OUTPATIENT_CLINIC_OR_DEPARTMENT_OTHER)
Admission: RE | Admit: 2018-01-20 | Discharge: 2018-01-20 | Disposition: A | Discharge status: Home / Self Care | Payer: Medicare Other | Source: Ambulatory Visit | Attending: Obstetrics & Gynecology | Admitting: Obstetrics & Gynecology

## 2018-01-20 VITALS — BP 145/92 | HR 92 | Wt 224.0 lb

## 2018-01-20 DIAGNOSIS — N83201 Unspecified ovarian cyst, right side: Secondary | ICD-10-CM | POA: Diagnosis present

## 2018-01-20 DIAGNOSIS — Z90710 Acquired absence of both cervix and uterus: Secondary | ICD-10-CM | POA: Insufficient documentation
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
## 2018-01-20 NOTE — Progress Notes (Signed)
Patient presents for follow up of ovarian cyst. Armandina StammerJennifer Remmy Crass RN

## 2018-01-20 NOTE — Patient Instructions (Signed)
Ovarian Cyst An ovarian cyst is a fluid-filled sac that forms on an ovary. The ovaries are small organs that produce eggs in women. Various types of cysts can form on the ovaries. Some may cause symptoms and require treatment. Most ovarian cysts go away on their own, are not cancerous (are benign), and do not cause problems. Common types of ovarian cysts include:  Functional (follicle) cysts. ? Occur during the menstrual cycle, and usually go away with the next menstrual cycle if you do not get pregnant. ? Usually cause no symptoms.  Endometriomas. ? Are cysts that form from the tissue that lines the uterus (endometrium). ? Are sometimes called "chocolate cysts" because they become filled with blood that turns brown. ? Can cause pain in the lower abdomen during intercourse and during your period.  Cystadenoma cysts. ? Develop from cells on the outside surface of the ovary. ? Can get very large and cause lower abdomen pain and pain with intercourse. ? Can cause severe pain if they twist or break open (rupture).  Dermoid cysts. ? Are sometimes found in both ovaries. ? May contain different kinds of body tissue, such as skin, teeth, hair, or cartilage. ? Usually do not cause symptoms unless they get very big.  Theca lutein cysts. ? Occur when too much of a certain hormone (human chorionic gonadotropin) is produced and overstimulates the ovaries to produce an egg. ? Are most common after having procedures used to assist with the conception of a baby (in vitro fertilization).  What are the causes? Ovarian cysts may be caused by:  Ovarian hyperstimulation syndrome. This is a condition that can develop from taking fertility medicines. It causes multiple large ovarian cysts to form.  Polycystic ovarian syndrome (PCOS). This is a common hormonal disorder that can cause ovarian cysts, as well as problems with your period or fertility.  What increases the risk? The following factors may make  you more likely to develop ovarian cysts:  Being overweight or obese.  Taking fertility medicines.  Taking certain forms of hormonal birth control.  Smoking.  What are the signs or symptoms? Many ovarian cysts do not cause symptoms. If symptoms are present, they may include:  Pelvic pain or pressure.  Pain in the lower abdomen.  Pain during sex.  Abdominal swelling.  Abnormal menstrual periods.  Increasing pain with menstrual periods.  How is this diagnosed? These cysts are commonly found during a routine pelvic exam. You may have tests to find out more about the cyst, such as:  Ultrasound.  X-ray of the pelvis.  CT scan.  MRI.  Blood tests.  How is this treated? Many ovarian cysts go away on their own without treatment. Your health care provider may want to check your cyst regularly for 2-3 months to see if it changes. If you are in menopause, it is especially important to have your cyst monitored closely because menopausal women have a higher rate of ovarian cancer. When treatment is needed, it may include:  Medicines to help relieve pain.  A procedure to drain the cyst (aspiration).  Surgery to remove the whole cyst.  Hormone treatment or birth control pills. These methods are sometimes used to help dissolve a cyst.  Follow these instructions at home:  Take over-the-counter and prescription medicines only as told by your health care provider.  Do not drive or use heavy machinery while taking prescription pain medicine.  Get regular pelvic exams and Pap tests as often as told by your health care   provider.  Return to your normal activities as told by your health care provider. Ask your health care provider what activities are safe for you.  Do not use any products that contain nicotine or tobacco, such as cigarettes and e-cigarettes. If you need help quitting, ask your health care provider.  Keep all follow-up visits as told by your health care provider.  This is important. Contact a health care provider if:  Your periods are late, irregular, or painful, or they stop.  You have pelvic pain that does not go away.  You have pressure on your bladder or trouble emptying your bladder completely.  You have pain during sex.  You have any of the following in your abdomen: ? A feeling of fullness. ? Pressure. ? Discomfort. ? Pain that does not go away. ? Swelling.  You feel generally ill.  You become constipated.  You lose your appetite.  You develop severe acne.  You start to have more body hair and facial hair.  You are gaining weight or losing weight without changing your exercise and eating habits.  You think you may be pregnant. Get help right away if:  You have abdominal pain that is severe or gets worse.  You cannot eat or drink without vomiting.  You suddenly develop a fever.  Your menstrual period is much heavier than usual. This information is not intended to replace advice given to you by your health care provider. Make sure you discuss any questions you have with your health care provider. Document Released: 09/29/2005 Document Revised: 04/18/2016 Document Reviewed: 03/02/2016 Elsevier Interactive Patient Education  2018 Elsevier Inc.  

## 2018-01-21 LAB — OVARIAN MALIGNANCY RISK-ROMA
CANCER ANTIGEN (CA) 125: 9.8 U/mL (ref 0.0–38.1)
HE4: 65.9 pmol/L (ref 0.0–96.9)
Postmenopausal ROMA: 1.13
Premenopausal ROMA: 1.31 — ABNORMAL HIGH

## 2018-01-21 LAB — POSTMENOPAUSAL INTERP: LOW

## 2018-01-21 LAB — PREMENOPAUSAL INTERP: HIGH

## 2018-01-22 ENCOUNTER — Other Ambulatory Visit: Payer: Self-pay | Admitting: Obstetrics & Gynecology

## 2018-01-22 ENCOUNTER — Telehealth: Payer: Self-pay | Admitting: Obstetrics & Gynecology

## 2018-01-22 DIAGNOSIS — N83201 Unspecified ovarian cyst, right side: Secondary | ICD-10-CM

## 2018-01-22 NOTE — Progress Notes (Incomplete)
pelvic

## 2018-01-24 NOTE — Telephone Encounter (Signed)
TC to pt re her US where no ovaries were seen but, no adnexal masses were seen either. Her OV cancer screening tests were both low probability. She is now without pain.   Rec f/u in 3 months with repeat [place] followed by visit since the ovaries were not seen and to alay pt concerns.   Ji Feldner L. Harraway-Smith, M.D., Evern CoreFACOG

## 2018-01-25 ENCOUNTER — Telehealth: Payer: Self-pay

## 2018-01-25 NOTE — Telephone Encounter (Signed)
-----   Message from Willodean Rosenthalarolyn Harraway-Smith, MD sent at 01/24/2018  6:45 PM EDT ----- Please call pt.  I forgot to mention to her to use coconut oil (100% found in the grocery store) on the vulva and in the vagina as a moisturizer (pt reported dyspareunia) bid.    Thx,  clh-S

## 2018-01-25 NOTE — Telephone Encounter (Signed)
Patient called and made aware that she can use 100% coconut oil for vaginal dryness twice a day per Dr. Erin FullingHarraway Smith.  Patient states understanding. Brooke StammerJennifer Howard RN

## 2018-01-26 LAB — CUP PACEART REMOTE DEVICE CHECK
Battery Remaining Longevity: 74 mo
Battery Voltage: 3.01 V
Brady Statistic AP VP Percent: 0.03 %
Brady Statistic AS VS Percent: 15.1 %
Brady Statistic RA Percent Paced: 84.32 %
Date Time Interrogation Session: 20190404142433
Implantable Lead Location: 753859
Implantable Lead Location: 753860
Implantable Pulse Generator Implant Date: 20150114
Lead Channel Impedance Value: 323 Ohm
Lead Channel Impedance Value: 418 Ohm
Lead Channel Impedance Value: 437 Ohm
Lead Channel Pacing Threshold Amplitude: 0.875 V
Lead Channel Pacing Threshold Amplitude: 1 V
Lead Channel Pacing Threshold Pulse Width: 0.4 ms
Lead Channel Pacing Threshold Pulse Width: 0.4 ms
Lead Channel Sensing Intrinsic Amplitude: 2 mV
Lead Channel Setting Pacing Amplitude: 2 V
MDC IDC MSMT LEADCHNL RA SENSING INTR AMPL: 2 mV
MDC IDC MSMT LEADCHNL RV IMPEDANCE VALUE: 551 Ohm
MDC IDC MSMT LEADCHNL RV SENSING INTR AMPL: 15.875 mV
MDC IDC MSMT LEADCHNL RV SENSING INTR AMPL: 15.875 mV
MDC IDC SET LEADCHNL RV PACING AMPLITUDE: 2 V
MDC IDC SET LEADCHNL RV PACING PULSEWIDTH: 0.4 ms
MDC IDC SET LEADCHNL RV SENSING SENSITIVITY: 0.9 mV
MDC IDC STAT BRADY AP VS PERCENT: 84.87 %
MDC IDC STAT BRADY AS VP PERCENT: 0 %
MDC IDC STAT BRADY RV PERCENT PACED: 0.04 %

## 2018-01-27 ENCOUNTER — Encounter: Payer: Self-pay | Admitting: Cardiology

## 2018-01-27 ENCOUNTER — Ambulatory Visit (INDEPENDENT_AMBULATORY_CARE_PROVIDER_SITE_OTHER): Payer: Medicare Other | Admitting: Cardiology

## 2018-01-27 VITALS — BP 128/72 | HR 72 | Ht 65.0 in | Wt 225.0 lb

## 2018-01-27 DIAGNOSIS — I10 Essential (primary) hypertension: Secondary | ICD-10-CM | POA: Diagnosis not present

## 2018-01-27 DIAGNOSIS — E1142 Type 2 diabetes mellitus with diabetic polyneuropathy: Secondary | ICD-10-CM

## 2018-01-27 DIAGNOSIS — I480 Paroxysmal atrial fibrillation: Secondary | ICD-10-CM | POA: Diagnosis not present

## 2018-01-27 DIAGNOSIS — E785 Hyperlipidemia, unspecified: Secondary | ICD-10-CM | POA: Diagnosis not present

## 2018-01-27 DIAGNOSIS — Z8673 Personal history of transient ischemic attack (TIA), and cerebral infarction without residual deficits: Secondary | ICD-10-CM

## 2018-01-27 HISTORY — DX: Paroxysmal atrial fibrillation: I48.0

## 2018-01-27 MED ORDER — APIXABAN 5 MG PO TABS: 5.0000 mg | ORAL_TABLET | Freq: Two times a day (BID) | ORAL | 2 refills | Status: DC

## 2018-01-27 NOTE — Patient Instructions (Signed)
Medication Instructions:  Your physician has recommended you make the following change in your medication:  CHANGE eliquis 5 mg twice daily Med refill sent for eliquis and samples given.  Labwork: None  Testing/Procedures: None  Follow-Up: Your physician recommends that you schedule a follow-up appointment in: 6 months  Any Other Special Instructions Will Be Listed Below (If Applicable).     If you need a refill on your cardiac medications before your next appointment, please call your pharmacy.   [facility]  Garey HamAshley A, RN, BSN

## 2018-01-27 NOTE — Progress Notes (Signed)
Cardiology Office Note:    Date:  01/27/2018   ID:  Brooke Pitts, DOB 1946-09-29, MRN [id number]  PCP:  Loyal Jacobson, MD  Cardiologist:  Garwin Brothers, MD   Referring MD: Loyal Jacobson, MD    ASSESSMENT:    1. Essential hypertension   2. Type 2 diabetes mellitus with diabetic polyneuropathy, without long-term current use of insulin (HCC)   3. History of CVA (cerebrovascular accident)   4. Hyperlipidemia LDL goal <100   5. PAF (paroxysmal atrial fibrillation) (HCC)    PLAN:    In order of problems listed above:  1. Primary prevention stressed with the patient.  Importance of compliance with diet and medications stressed and she vocalized understanding.  Her blood pressure is stable. 2. I discussed with the patient atrial fibrillation, disease process. Management and therapy including rate and rhythm control, anticoagulation benefits and potential risks were discussed extensively with the patient. Patient had multiple questions which were answered to patient's satisfaction. 3. I am going to increase her Eliquis to 5 mg twice daily.  In the past she has had renal insufficiency but the last evaluation reveals a renal function to be within normal limits.  I discussed this with her.  Importance of regular exercise stressed 4. Patient will be seen in follow-up appointment in 6 months or earlier if the patient has any concerns 5. She is a diabetic and tells me that she cannot tolerate aspirin.  She is also been tried on multiple statins and she cannot tolerate them.  I will leave  this up to the follow-up of her primary care physician.   Medication Adjustments/Labs and Tests Ordered: Current medicines are reviewed at length with the patient today.  Concerns regarding medicines are outlined above.  No orders of the defined types were placed in this encounter.  Meds ordered this encounter  Medications  . apixaban (ELIQUIS) 5 MG TABS tablet    Sig: Take 1 tablet (5 mg total)  by mouth 2 (two) times daily.    Dispense:  180 tablet    Refill:  2     Chief Complaint  Patient presents with  . Follow-up  . Atrial Fibrillation     History of Present Illness:    Brooke Pitts is a 72 y.o. female the patient has approximately fibrillation, essential hypertension and diabetes mellitus.  She has a history of stroke.  She denies any problems at this time.  Her problems no other abdominal issues and she feels that she has an ovarian cyst which is causing her pain at times.  No orthopnea or PND.  She ambulates well without any problems she does not have an exercise schedule.  At the time of my evaluation, the patient is alert awake oriented and in no distress.  Past Medical History:  Diagnosis Date  . A-fib (HCC)   . Diabetes mellitus without complication (HCC)   . Hypertension   . Stroke [facility])     Past Surgical History:  Procedure Laterality Date  . ABDOMINAL HYSTERECTOMY    . CHOLECYSTECTOMY    . PACEMAKER INSERTION    . TONSILLECTOMY      Current Medications: No outpatient medications have been marked as taking for the 01/27/18 encounter (Office Visit) with Revankar, Aundra Dubin, MD.     Allergies:   Cymbalta [duloxetine hcl]; Hydrocodone; Lisinopril; Amlodipine; Duloxetine; Losartan; and Nsaids   Social History   Socioeconomic History  . Marital status: Married    Spouse name: Not on  file  . Number of children: Not on file  . Years of education: Not on file  . Highest education level: Not on file  Occupational History  . Not on file  Social Needs  . Financial resource strain: Not on file  . Food insecurity:    Worry: Not on file    Inability: Not on file  . Transportation needs:    Medical: Not on file    Non-medical: Not on file  Tobacco Use  . Smoking status: Never Smoker  . Smokeless tobacco: Never Used  Substance and Sexual Activity  . Alcohol use: No  . Drug use: No  . Sexual activity: Not on file  Lifestyle  . Physical activity:     Days per week: Not on file    Minutes per session: Not on file  . Stress: Not on file  Relationships  . Social connections:    Talks on phone: Not on file    Gets together: Not on file    Attends religious service: Not on file    Active member of club or organization: Not on file    Attends meetings of clubs or organizations: Not on file    Relationship status: Not on file  Other Topics Concern  . Not on file  Social History Narrative  . Not on file     Family History: The patient's family history includes Arrhythmia in her mother; Atrial fibrillation in her mother; Heart disease in her brother; Heart failure in her brother.  ROS:   Please see the history of present illness.    All other systems reviewed and are negative.  EKGs/Labs/Other Studies Reviewed:    The following studies were reviewed today: Discussed my findings from today's visit with the patient at length.   Recent Labs: 12/05/2017: ALT 20; BUN 16; Creatinine, Ser 0.75; Hemoglobin 12.6; Platelets 208; Potassium 3.8; Sodium 137  Recent Lipid Panel No results found for: CHOL, TRIG, HDL, CHOLHDL, VLDL, LDLCALC, LDLDIRECT  Physical Exam:    VS:  BP 128/72 (BP Location: Left Arm, Patient Position: Sitting, Cuff Size: Normal)   Pulse 72   Ht 5\' 5"  (1.651 m)   Wt 225 lb (102.1 kg)   SpO2 99%   BMI 37.44 kg/m     Wt Readings from Last 3 Encounters:  01/27/18 225 lb (102.1 kg)  01/20/18 224 lb (101.6 kg)  12/05/17 227 lb (103 kg)     GEN: Patient is in no acute distress HEENT: Normal NECK: No JVD; No carotid bruits LYMPHATICS: No lymphadenopathy CARDIAC: Hear sounds regular, 2/6 systolic murmur at the apex. RESPIRATORY:  Clear to auscultation without rales, wheezing or rhonchi  ABDOMEN: Soft, non-tender, non-distended MUSCULOSKELETAL:  No edema; No deformity  SKIN: Warm and dry NEUROLOGIC:  Alert and oriented x 3 PSYCHIATRIC:  Normal affect   Signed, Garwin Brothersajan R Revankar, MD  01/27/2018 11:02 AM      [facility]

## 2018-02-03 ENCOUNTER — Telehealth: Payer: Self-pay

## 2018-02-03 DIAGNOSIS — E876 Hypokalemia: Secondary | ICD-10-CM

## 2018-02-03 DIAGNOSIS — I10 Essential (primary) hypertension: Secondary | ICD-10-CM

## 2018-02-03 NOTE — Telephone Encounter (Signed)
Patient called stating that she is experiencing swelling to bilateral lower extremities. Patient has experienced a 4 pound weight increase in 1 week. Informed patient that this is not likely to be a side effect of the eliquis increase and that she should call her PCP for an appointment to discuss her concerns. Informed patient to take 50 mg of HCTZ today and tomorrow, monitor her weight daily, and increase her potassium intake for the next few days. Patient will come to the office on Friday for a BMP. Patient was agreeable to the instructions given.

## 2018-02-06 LAB — BASIC METABOLIC PANEL
BUN / CREAT RATIO: 18 (ref 12–28)
BUN: 13 mg/dL (ref 8–27)
CO2: 22 mmol/L (ref 20–29)
Calcium: 9.5 mg/dL (ref 8.7–10.3)
Chloride: 96 mmol/L (ref 96–106)
Creatinine, Ser: 0.74 mg/dL (ref 0.57–1.00)
GFR calc Af Amer: 95 mL/min/{1.73_m2} (ref >59)
GFR calc non Af Amer: 82 mL/min/{1.73_m2} (ref >59)
Glucose: 179 mg/dL — ABNORMAL HIGH (ref 65–99)
Potassium: 4.3 mmol/L (ref 3.5–5.2)
SODIUM: 136 mmol/L (ref 134–144)

## 2018-04-16 ENCOUNTER — Telehealth: Payer: Self-pay | Admitting: Cardiology

## 2018-04-16 ENCOUNTER — Ambulatory Visit (INDEPENDENT_AMBULATORY_CARE_PROVIDER_SITE_OTHER): Payer: Medicare Other | Admitting: *Deleted

## 2018-04-16 DIAGNOSIS — I495 Sick sinus syndrome: Secondary | ICD-10-CM | POA: Diagnosis not present

## 2018-04-16 NOTE — Telephone Encounter (Signed)
LMOVM reminding pt to send remote transmission.   

## 2018-04-19 NOTE — Progress Notes (Signed)
Remote pacemaker transmission.   

## 2018-04-21 ENCOUNTER — Encounter: Payer: Self-pay | Admitting: Cardiology

## 2018-05-07 LAB — CUP PACEART REMOTE DEVICE CHECK
Brady Statistic AP VP Percent: 0.03 %
Brady Statistic AP VS Percent: 89.75 %
Brady Statistic AS VP Percent: 0 %
Brady Statistic RA Percent Paced: 89.26 %
Brady Statistic RV Percent Paced: 0.04 %
Date Time Interrogation Session: 20190708153819
Implantable Lead Location: 753859
Implantable Lead Model: 5076
Lead Channel Impedance Value: 304 Ohm
Lead Channel Impedance Value: 418 Ohm
Lead Channel Impedance Value: 418 Ohm
Lead Channel Impedance Value: 532 Ohm
Lead Channel Pacing Threshold Pulse Width: 0.4 ms
Lead Channel Pacing Threshold Pulse Width: 0.4 ms
Lead Channel Sensing Intrinsic Amplitude: 1.75 mV
Lead Channel Sensing Intrinsic Amplitude: 15.5 mV
Lead Channel Sensing Intrinsic Amplitude: 15.5 mV
Lead Channel Setting Pacing Amplitude: 1.75 V
Lead Channel Setting Pacing Pulse Width: 0.4 ms
MDC IDC LEAD LOCATION: 753860
MDC IDC MSMT BATTERY REMAINING LONGEVITY: 73 mo
MDC IDC MSMT BATTERY VOLTAGE: 3 V
MDC IDC MSMT LEADCHNL RA PACING THRESHOLD AMPLITUDE: 0.875 V
MDC IDC MSMT LEADCHNL RA SENSING INTR AMPL: 1.75 mV
MDC IDC MSMT LEADCHNL RV PACING THRESHOLD AMPLITUDE: 0.75 V
MDC IDC PG IMPLANT DT: 20150114
MDC IDC SET LEADCHNL RV PACING AMPLITUDE: 2 V
MDC IDC SET LEADCHNL RV SENSING SENSITIVITY: 0.9 mV
MDC IDC STAT BRADY AS VS PERCENT: 10.21 %

## 2018-07-12 ENCOUNTER — Encounter: Payer: Medicare Other | Admitting: Cardiology

## 2018-07-19 ENCOUNTER — Ambulatory Visit (INDEPENDENT_AMBULATORY_CARE_PROVIDER_SITE_OTHER): Payer: Medicare Other | Admitting: *Deleted

## 2018-07-19 ENCOUNTER — Telehealth: Payer: Self-pay

## 2018-07-19 DIAGNOSIS — I10 Essential (primary) hypertension: Secondary | ICD-10-CM

## 2018-07-19 DIAGNOSIS — I495 Sick sinus syndrome: Secondary | ICD-10-CM

## 2018-07-19 NOTE — Telephone Encounter (Signed)
I spoke with the patient about this.

## 2018-07-19 NOTE — Progress Notes (Signed)
Remote pacemaker transmission.   

## 2018-07-28 ENCOUNTER — Encounter: Payer: Self-pay | Admitting: Cardiology

## 2018-08-03 ENCOUNTER — Encounter: Payer: Self-pay | Admitting: Cardiology

## 2018-08-12 LAB — CUP PACEART REMOTE DEVICE CHECK
Brady Statistic AP VP Percent: 0.05 %
Brady Statistic AP VS Percent: 92.6 %
Brady Statistic AS VS Percent: 7.29 %
Implantable Lead Location: 753859
Lead Channel Impedance Value: 304 Ohm
Lead Channel Impedance Value: 418 Ohm
Lead Channel Impedance Value: 437 Ohm
Lead Channel Pacing Threshold Amplitude: 0.75 V
Lead Channel Sensing Intrinsic Amplitude: 1.625 mV
Lead Channel Sensing Intrinsic Amplitude: 14.625 mV
Lead Channel Setting Pacing Amplitude: 1.75 V
Lead Channel Setting Pacing Amplitude: 2 V
MDC IDC LEAD LOCATION: 753860
MDC IDC MSMT BATTERY REMAINING LONGEVITY: 73 mo
MDC IDC MSMT BATTERY VOLTAGE: 3 V
MDC IDC MSMT LEADCHNL RA PACING THRESHOLD AMPLITUDE: 0.875 V
MDC IDC MSMT LEADCHNL RA PACING THRESHOLD PULSEWIDTH: 0.4 ms
MDC IDC MSMT LEADCHNL RA SENSING INTR AMPL: 1.625 mV
MDC IDC MSMT LEADCHNL RV IMPEDANCE VALUE: 551 Ohm
MDC IDC MSMT LEADCHNL RV PACING THRESHOLD PULSEWIDTH: 0.4 ms
MDC IDC MSMT LEADCHNL RV SENSING INTR AMPL: 14.625 mV
MDC IDC PG IMPLANT DT: 20150114
MDC IDC SESS DTM: 20191007150918
MDC IDC SET LEADCHNL RV PACING PULSEWIDTH: 0.4 ms
MDC IDC SET LEADCHNL RV SENSING SENSITIVITY: 0.9 mV
MDC IDC STAT BRADY AS VP PERCENT: 0.06 %
MDC IDC STAT BRADY RA PERCENT PACED: 91.81 %
MDC IDC STAT BRADY RV PERCENT PACED: 0.11 %

## 2018-08-23 ENCOUNTER — Encounter: Payer: Self-pay | Admitting: Cardiology

## 2018-08-23 ENCOUNTER — Ambulatory Visit (INDEPENDENT_AMBULATORY_CARE_PROVIDER_SITE_OTHER): Payer: Medicare Other | Admitting: Cardiology

## 2018-08-23 VITALS — BP 130/76 | HR 72 | Ht 65.0 in | Wt 226.0 lb

## 2018-08-23 VITALS — BP 130/76 | HR 72 | Ht 65.0 in | Wt 220.0 lb

## 2018-08-23 DIAGNOSIS — Z9989 Dependence on other enabling machines and devices: Secondary | ICD-10-CM

## 2018-08-23 DIAGNOSIS — E785 Hyperlipidemia, unspecified: Secondary | ICD-10-CM | POA: Diagnosis not present

## 2018-08-23 DIAGNOSIS — I495 Sick sinus syndrome: Secondary | ICD-10-CM

## 2018-08-23 DIAGNOSIS — G4733 Obstructive sleep apnea (adult) (pediatric): Secondary | ICD-10-CM

## 2018-08-23 DIAGNOSIS — I10 Essential (primary) hypertension: Secondary | ICD-10-CM

## 2018-08-23 DIAGNOSIS — Z950 Presence of cardiac pacemaker: Secondary | ICD-10-CM

## 2018-08-23 DIAGNOSIS — E1141 Type 2 diabetes mellitus with diabetic mononeuropathy: Secondary | ICD-10-CM

## 2018-08-23 DIAGNOSIS — I480 Paroxysmal atrial fibrillation: Secondary | ICD-10-CM

## 2018-08-23 DIAGNOSIS — E1142 Type 2 diabetes mellitus with diabetic polyneuropathy: Secondary | ICD-10-CM | POA: Diagnosis not present

## 2018-08-23 DIAGNOSIS — E66811 Obesity, class 1: Secondary | ICD-10-CM

## 2018-08-23 DIAGNOSIS — Z8673 Personal history of transient ischemic attack (TIA), and cerebral infarction without residual deficits: Secondary | ICD-10-CM

## 2018-08-23 DIAGNOSIS — E669 Obesity, unspecified: Secondary | ICD-10-CM

## 2018-08-23 NOTE — Addendum Note (Signed)
Addended by: Carren Rang on: 08/23/2018 04:20 PM   Modules accepted: Orders

## 2018-08-23 NOTE — Progress Notes (Signed)
Cardiology Office Note:    Date:  08/23/2018   ID:  Brooke Pitts, DOB [birth year], MRN [id number]  PCP:  Brooke Jacobson, MD  Cardiologist:  Brooke Brothers, MD   Referring MD: Brooke Jacobson, MD    ASSESSMENT:    1. PAF (paroxysmal atrial fibrillation) (HCC)   2. Hyperlipidemia LDL goal <100   3. Essential hypertension   4. Type 2 diabetes mellitus with diabetic polyneuropathy, without long-term current use of insulin (HCC)   5. S/P placement of cardiac pacemaker   6. OSA on CPAP   7. Obesity (BMI 30.0-34.9)   8. History of CVA (cerebrovascular accident)   9. Diabetic mononeuropathy associated with type 2 diabetes mellitus (HCC)    PLAN:    In order of problems listed above:  1. Primary prevention stressed with the patient.  Importance of compliance with diet and medication stressed and she vocalized understanding.  Her blood pressure is stable.  Diet was discussed for dyslipidemia and diabetes mellitus and she is doing the best with her diet. 2. She also mentions to me that her pacemaker is going to be checked today. 3. I discussed with the patient atrial fibrillation, disease process. Management and therapy including rate and rhythm control, anticoagulation benefits and potential risks were discussed extensively with the patient. Patient had multiple questions which were answered to patient's satisfaction. 4. She is in sinus rhythm and denies any palpitations or any such issues. 5. Patient will be seen in follow-up appointment in 6 months or earlier if the patient has any concerns    Medication Adjustments/Labs and Tests Ordered: Current medicines are reviewed at length with the patient today.  Concerns regarding medicines are outlined above.  No orders of the defined types were placed in this encounter.  No orders of the defined types were placed in this encounter.    No chief complaint on file.    History of Present Illness:    Brooke Pitts is a 72  y.o. female.  Patient has history of permanent dual-chamber pacemaker.  She also has history of paroxysmal atrial fibrillation, essential hypertension, diabetes mellitus and history of stroke.  She denies any problems at this time and takes care of activities of daily living she mentions to me that her primary care doctor has taken her off statin because of significant issues with those medications in the past and multiple medications have been tried predominantly statins.  Now she is trying to do her best with her diet primary care doctor is managing her lipids.  She denies any chest pain orthopnea or PND.  She leads a sedentary lifestyle because of issues to dealing with pain with her ovarian cyst according to the history provided by her.  At the time of my evaluation, the patient is alert awake oriented and in no distress.  Past Medical History:  Diagnosis Date  . A-fib (HCC)   . Diabetes mellitus without complication (HCC)   . Hypertension   . Stroke [facility])     Past Surgical History:  Procedure Laterality Date  . ABDOMINAL HYSTERECTOMY    . CHOLECYSTECTOMY    . PACEMAKER INSERTION    . TONSILLECTOMY      Current Medications: Current Meds  Medication Sig  . apixaban (ELIQUIS) 5 MG TABS tablet Take 1 tablet (5 mg total) by mouth 2 (two) times daily.  Marland Kitchen atenolol (TENORMIN) 50 MG tablet Take 50 mg by mouth daily.  Marland Kitchen b complex vitamins tablet Take 1 tablet by mouth  daily.  . Calcium Carbonate-Vitamin D (CALCIUM + D PO) Take by mouth.  . DimenhyDRINATE (DRAMAMINE PO) Take 1 tablet by mouth daily as needed.  . hydrochlorothiazide (HYDRODIURIL) 25 MG tablet Take 25 mg by mouth daily.  . irbesartan (AVAPRO) 300 MG tablet Take 300 mg by mouth daily.  . magnesium oxide (MAG-OX) 400 MG tablet Take 400 mg by mouth daily.  . metFORMIN (GLUCOPHAGE) 500 MG tablet Take by mouth 2 (two) times daily with a meal.  . Omega-3 Fatty Acids (FISH OIL) 1200 MG CPDR Take by mouth.  [name] DELICA LANCETS  33G MISC   . ONETOUCH VERIO test strip   . ranitidine (ZANTAC) 150 MG tablet Take 150 mg by mouth 2 (two) times daily.   Marland Kitchen UNKNOWN TO PATIENT Another BP med     Allergies:   Cymbalta [duloxetine hcl]; Hydrocodone; Lisinopril; Amlodipine; Duloxetine; Losartan; and Nsaids   Social History   Socioeconomic History  . Marital status: Married    Spouse name: Not on file  . Number of children: Not on file  . Years of education: Not on file  . Highest education level: Not on file  Occupational History  . Not on file  Social Needs  . Financial resource strain: Not on file  . Food insecurity:    Worry: Not on file    Inability: Not on file  . Transportation needs:    Medical: Not on file    Non-medical: Not on file  Tobacco Use  . Smoking status: Never Smoker  . Smokeless tobacco: Never Used  Substance and Sexual Activity  . Alcohol use: No  . Drug use: No  . Sexual activity: Not on file  Lifestyle  . Physical activity:    Days per week: Not on file    Minutes per session: Not on file  . Stress: Not on file  Relationships  . Social connections:    Talks on phone: Not on file    Gets together: Not on file    Attends religious service: Not on file    Active member of club or organization: Not on file    Attends meetings of clubs or organizations: Not on file    Relationship status: Not on file  Other Topics Concern  . Not on file  Social History Narrative  . Not on file     Family History: The patient's family history includes Arrhythmia in her mother; Atrial fibrillation in her mother; Heart disease in her brother; Heart failure in her brother.  ROS:   Please see the history of present illness.    All other systems reviewed and are negative.  EKGs/Labs/Other Studies Reviewed:    The following studies were reviewed today: I discussed my findings with the patient at extensive length.  EKG reveals paced rhythm with atrial pacing and ventricular sensing and  tracking    Recent Labs: 12/05/2017: ALT 20; Hemoglobin 12.6; Platelets 208 02/05/2018: BUN 13; Creatinine, Ser 0.74; Potassium 4.3; Sodium 136  Recent Lipid Panel No results found for: CHOL, TRIG, HDL, CHOLHDL, VLDL, LDLCALC, LDLDIRECT  Physical Exam:    VS:  BP 130/76 (BP Location: Right Arm, Patient Position: Sitting, Cuff Size: Normal)   Pulse 72   Ht 5\' 5"  (1.651 m)   Wt 220 lb (99.8 kg)   SpO2 99%   BMI 36.61 kg/m     Wt Readings from Last 3 Encounters:  08/23/18 220 lb (99.8 kg)  01/27/18 225 lb (102.1 kg)  01/20/18 224  lb (101.6 kg)     GEN: Patient is in no acute distress HEENT: Normal NECK: No JVD; No carotid bruits LYMPHATICS: No lymphadenopathy CARDIAC: Hear sounds regular, 2/6 systolic murmur at the apex. RESPIRATORY:  Clear to auscultation without rales, wheezing or rhonchi  ABDOMEN: Soft, non-tender, non-distended MUSCULOSKELETAL:  No edema; No deformity  SKIN: Warm and dry NEUROLOGIC:  Alert and oriented x 3 PSYCHIATRIC:  Normal affect   Signed, Brooke Brothers, MD  08/23/2018 2:32 PM    [facility]

## 2018-08-23 NOTE — Patient Instructions (Signed)
Medication Instructions:  Your physician recommends that you continue on your current medications as directed. Please refer to the Current Medication list given to you today.  Samples of eliquis given  If you need a refill on your cardiac medications before your next appointment, please call your pharmacy.   Lab work: None  If you have labs (blood work) drawn today and your tests are completely normal, you will receive your results only by: Marland Kitchen MyChart Message (if you have MyChart) OR . A paper copy in the mail If you have any lab test that is abnormal or we need to change your treatment, we will call you to review the results.  Testing/Procedures: None  Follow-Up: At [facility], you and your health needs are our priority.  As part of our continuing mission to provide you with exceptional heart care, we have created designated Provider Care Teams.  These Care Teams include your primary Cardiologist (physician) and Advanced Practice Providers (APPs -  Physician Assistants and Nurse Practitioners) who all work together to provide you with the care you need, when you need it.  You will need a follow up appointment in 6 months.  Please call our office 2 months in advance to schedule this appointment.  You may see another member of our [employer] Provider Team in [place]: Gypsy Balsam, MD . Norman Herrlich, MD  Any Other Special Instructions Will Be Listed Below (If Applicable).

## 2018-08-23 NOTE — Progress Notes (Signed)
Electrophysiology Office Note   Date:  08/23/2018   ID:  Brooke, Pitts [birth year], MRN [id number]  PCP:  Loyal Jacobson, MD  Cardiologist:  Revankar Primary Electrophysiologist:  Regan Lemming, MD    No chief complaint on file.    History of Present Illness: Brooke Pitts is a 72 y.o. female who is being seen today for the evaluation of atrial fibrillation at the request of Loyal Jacobson, MD. Presenting today for electrophysiology evaluation. She has a history of hypertension, hyperlipidemia, and diabetes. She had a pacemaker [facility] in [facility]. She also has atrial fibrillation and has had a stroke in the past.  Today, denies symptoms of palpitations, chest pain, shortness of breath, orthopnea, PND, lower extremity edema, claudication, dizziness, presyncope, syncope, bleeding, or neurologic sequela. The patient is tolerating medications without difficulties.    Past Medical History:  Diagnosis Date  . A-fib (HCC)   . Diabetes mellitus without complication (HCC)   . Hypertension   . Stroke [facility])    Past Surgical History:  Procedure Laterality Date  . ABDOMINAL HYSTERECTOMY    . CHOLECYSTECTOMY    . PACEMAKER INSERTION    . TONSILLECTOMY       Current Outpatient Medications  Medication Sig Dispense Refill  . apixaban (ELIQUIS) 5 MG TABS tablet Take 1 tablet (5 mg total) by mouth 2 (two) times daily. 180 tablet 2  . atenolol (TENORMIN) 50 MG tablet Take 50 mg by mouth daily.    Marland Kitchen b complex vitamins tablet Take 1 tablet by mouth daily.    . Calcium Carbonate-Vitamin D (CALCIUM + D PO) Take by mouth.    . DimenhyDRINATE (DRAMAMINE PO) Take 1 tablet by mouth daily as needed.    . hydrochlorothiazide (HYDRODIURIL) 25 MG tablet Take 25 mg by mouth daily.    . irbesartan (AVAPRO) 300 MG tablet Take 300 mg by mouth daily.    . magnesium oxide (MAG-OX) 400 MG tablet Take 400 mg by mouth daily.    . metFORMIN (GLUCOPHAGE) 500 MG tablet Take by  mouth 2 (two) times daily with a meal.    . Omega-3 Fatty Acids (FISH OIL) 1200 MG CPDR Take by mouth.    [name] DELICA LANCETS 33G MISC     . ONETOUCH VERIO test strip     . ranitidine (ZANTAC) 150 MG tablet Take 150 mg by mouth 2 (two) times daily.   3  . UNKNOWN TO PATIENT Another BP med    . nitroGLYCERIN (NITROSTAT) 0.4 MG SL tablet Place 1 tablet (0.4 mg total) under the tongue every 5 (five) minutes as needed for chest pain. 30 tablet 6   No current facility-administered medications for this visit.     Allergies:   Cymbalta [duloxetine hcl]; Hydrocodone; Lisinopril; Amlodipine; Duloxetine; Losartan; and Nsaids   Social History:  The patient  reports that she has never smoked. She has never used smokeless tobacco. She reports that she does not drink alcohol or use drugs.   Family History:  The patient's family history includes Arrhythmia in her mother; Atrial fibrillation in her mother; Heart disease in her brother; Heart failure in her brother.    ROS:  Please see the history of present illness.   Otherwise, review of systems is positive for none.   All other systems are reviewed and negative.   PHYSICAL EXAM: VS:  BP 130/76   Pulse 72   Ht 5\' 5"  (1.651 m)   Wt 226 lb (102.5  kg)   BMI 37.61 kg/m  , BMI Body mass index is 37.61 kg/m. GEN: Well nourished, well developed, in no acute distress  HEENT: normal  Neck: no JVD, carotid bruits, or masses Cardiac: RRR; no murmurs, rubs, or gallops,no edema  Respiratory:  clear to auscultation bilaterally, normal work of breathing GI: soft, nontender, nondistended, + BS MS: no deformity or atrophy  Skin: warm and dry, device site well healed Neuro:  Strength and sensation are intact Psych: euthymic mood, full affect  EKG:  EKG is ordered today. Personal review of the ekg ordered shows atrial paced, first-degree AV block  Personal review of the device interrogation today. Results in Paceart   Recent Labs: 12/05/2017: ALT  20; Hemoglobin 12.6; Platelets 208 02/05/2018: BUN 13; Creatinine, Ser 0.74; Potassium 4.3; Sodium 136    Lipid Panel  No results found for: CHOL, TRIG, HDL, CHOLHDL, VLDL, LDLCALC, LDLDIRECT   Wt Readings from Last 3 Encounters:  08/23/18 226 lb (102.5 kg)  08/23/18 220 lb (99.8 kg)  01/27/18 225 lb (102.1 kg)      Other studies Reviewed: Additional studies/ records that were reviewed today include: TTE 9/*13/18  Review of the above records today demonstrates:  - Left ventricle: The cavity size was normal. Wall thickness was   normal. Systolic function was normal. The estimated ejection   fraction was in the range of 55% to 60%. Doppler parameters are   consistent with abnormal left ventricular relaxation (grade 1   diastolic dysfunction). - Aortic valve: There was trivial regurgitation. - Atrial septum: No defect or patent foramen ovale was identified.  Myoview 9/*13/18  Nuclear stress EF: 65%.  This is a low risk study.  The left ventricular ejection fraction is normal (55-65%).  Normal study. No evidence of ischemia or previous infarction .   ASSESSMENT AND PLAN:  1.  Paroxysmal atrial fibrillation: Coumadin for anticoagulation.  No antiarrhythmics at this time.  She has had minimal atrial fibrillation on device interrogation.     This patients CHA2DS2-VASc Score and unadjusted Ischemic Stroke Rate (% per year) is equal to 3.2 % stroke rate/year from a score of 3  Above score calculated as 1 point each if present [CHF, HTN, DM, Vascular=MI/PAD/Aortic Plaque, Age if 65-74, or Female] Above score calculated as 2 points each if present [Age > 75, or Stroke/TIA/TE]   2. Hypertension: Well-controlled today.  No changes.  3. Sick sinus syndrome: Status post Medtronic dual-chamber pacemaker.  Functioning appropriately.  No changes.    Current medicines are reviewed at length with the patient today.   The patient does not have concerns regarding her medicines.  The  following changes were made today: None  Labs/ tests ordered today include:  No orders of the defined types were placed in this encounter.    Disposition:   FU with Charmel Pronovost 1 year  Signed, Sharnice Bosler Jorja Loa, MD  08/23/2018 3:18 PM     [facility] [address] [place] [address] [phone] (office) [phone] (fax)

## 2018-08-23 NOTE — Patient Instructions (Signed)
Medication Instructions:  Your physician recommends that you continue on your current medications as directed. Please refer to the Current Medication list given to you today.  *If you need a refill on your cardiac medications before your next appointment, please call your pharmacy*  Labwork: None ordered  Testing/Procedures: None ordered  Follow-Up: Remote monitoring is used to monitor your Pacemaker or ICD from home. This monitoring reduces the number of office visits required to check your device to one time per year. It allows us to keep an eye on the functioning of your device to ensure it is working properly. You are scheduled for a device check from home on 10/18/2018. You may send your transmission at any time that day. If you have a wireless device, the transmission will be sent automatically. After your physician reviews your transmission, you will receive a postcard with your next transmission date.  Your physician wants you to follow-up in: 1 year with Dr. Camnitz.  You will receive a reminder letter in the mail two months in advance. If you don't receive a letter, please call our office to schedule the follow-up appointment.  Thank you for choosing [facility]!!   Sherri Price, RN [phone]    

## 2018-08-24 ENCOUNTER — Telehealth: Payer: Self-pay

## 2018-08-24 NOTE — Telephone Encounter (Signed)
**Note De-identified  Obfuscation** -----  **Note De-Identified  Obfuscation** Message from Baird LyonsSherri L Price, RN sent at 08/23/2018  5:25 PM EST ----- Regarding: Eliquis assistance Pt told me today she pain over $300 for Eliquis. Please see if there is pt assistance/lower cost.  thx

## 2018-08-24 NOTE — Telephone Encounter (Signed)
**Note De-Identified  Obfuscation** I called the pt and she advised me that she is sure she is in the "donut hole".  I discussed pt asst with her and she states that she is interested in applying with [employer] for her Eliquis.  Per the pts request I have placed the [employer] Pt [employer] application in our mail bin addressed to the pt.

## 2018-09-20 DIAGNOSIS — R600 Localized edema: Secondary | ICD-10-CM

## 2018-09-20 DIAGNOSIS — Z789 Other specified health status: Secondary | ICD-10-CM

## 2018-09-20 HISTORY — DX: Other specified health status: Z78.9

## 2018-09-20 HISTORY — DX: Localized edema: R60.0

## 2018-10-18 ENCOUNTER — Ambulatory Visit (INDEPENDENT_AMBULATORY_CARE_PROVIDER_SITE_OTHER): Payer: Medicare Other

## 2018-10-18 DIAGNOSIS — I495 Sick sinus syndrome: Secondary | ICD-10-CM

## 2018-10-19 LAB — CUP PACEART REMOTE DEVICE CHECK
Brady Statistic AS VP Percent: 0 %
Brady Statistic AS VS Percent: 9.9 %
Brady Statistic RA Percent Paced: 89.07 %
Implantable Lead Implant Date: 20150114
Implantable Lead Location: 753860
Implantable Lead Model: 5076
Implantable Lead Model: 5076
Lead Channel Impedance Value: 323 Ohm
Lead Channel Impedance Value: 532 Ohm
Lead Channel Pacing Threshold Amplitude: 0.875 V
Lead Channel Pacing Threshold Pulse Width: 0.4 ms
Lead Channel Pacing Threshold Pulse Width: 0.4 ms
Lead Channel Sensing Intrinsic Amplitude: 1.625 mV
Lead Channel Sensing Intrinsic Amplitude: 14.875 mV
Lead Channel Setting Pacing Amplitude: 1.75 V
Lead Channel Setting Pacing Pulse Width: 0.4 ms
MDC IDC LEAD IMPLANT DT: 20150114
MDC IDC LEAD LOCATION: 753859
MDC IDC MSMT BATTERY REMAINING LONGEVITY: 63 mo
MDC IDC MSMT BATTERY VOLTAGE: 3 V
MDC IDC MSMT LEADCHNL RA IMPEDANCE VALUE: 456 Ohm
MDC IDC MSMT LEADCHNL RA SENSING INTR AMPL: 1.625 mV
MDC IDC MSMT LEADCHNL RV IMPEDANCE VALUE: 399 Ohm
MDC IDC MSMT LEADCHNL RV PACING THRESHOLD AMPLITUDE: 0.75 V
MDC IDC MSMT LEADCHNL RV SENSING INTR AMPL: 14.875 mV
MDC IDC PG IMPLANT DT: 20150114
MDC IDC SESS DTM: 20200106150137
MDC IDC SET LEADCHNL RV PACING AMPLITUDE: 2 V
MDC IDC SET LEADCHNL RV SENSING SENSITIVITY: 0.9 mV
MDC IDC STAT BRADY AP VP PERCENT: 0.04 %
MDC IDC STAT BRADY AP VS PERCENT: 90.05 %
MDC IDC STAT BRADY RV PERCENT PACED: 0.04 %

## 2018-10-19 NOTE — Progress Notes (Signed)
Remote pacemaker transmission.   

## 2019-01-11 ENCOUNTER — Emergency Department (HOSPITAL_BASED_OUTPATIENT_CLINIC_OR_DEPARTMENT_OTHER): Payer: Medicare Other

## 2019-01-11 ENCOUNTER — Other Ambulatory Visit: Payer: Self-pay

## 2019-01-11 ENCOUNTER — Encounter (HOSPITAL_BASED_OUTPATIENT_CLINIC_OR_DEPARTMENT_OTHER): Payer: Self-pay

## 2019-01-11 ENCOUNTER — Emergency Department (HOSPITAL_BASED_OUTPATIENT_CLINIC_OR_DEPARTMENT_OTHER)
Admission: EM | Admit: 2019-01-11 | Discharge: 2019-01-11 | Disposition: A | Discharge status: Home / Self Care | Payer: Medicare Other | Attending: Emergency Medicine | Admitting: Emergency Medicine

## 2019-01-11 DIAGNOSIS — Z79899 Other long term (current) drug therapy: Secondary | ICD-10-CM | POA: Insufficient documentation

## 2019-01-11 DIAGNOSIS — Z8673 Personal history of transient ischemic attack (TIA), and cerebral infarction without residual deficits: Secondary | ICD-10-CM | POA: Diagnosis not present

## 2019-01-11 DIAGNOSIS — R519 Headache, unspecified: Secondary | ICD-10-CM

## 2019-01-11 DIAGNOSIS — R51 Headache: Secondary | ICD-10-CM | POA: Diagnosis not present

## 2019-01-11 DIAGNOSIS — Z7901 Long term (current) use of anticoagulants: Secondary | ICD-10-CM | POA: Diagnosis not present

## 2019-01-11 DIAGNOSIS — E871 Hypo-osmolality and hyponatremia: Secondary | ICD-10-CM | POA: Insufficient documentation

## 2019-01-11 DIAGNOSIS — E1142 Type 2 diabetes mellitus with diabetic polyneuropathy: Secondary | ICD-10-CM | POA: Diagnosis not present

## 2019-01-11 DIAGNOSIS — I10 Essential (primary) hypertension: Secondary | ICD-10-CM | POA: Diagnosis not present

## 2019-01-11 DIAGNOSIS — Z950 Presence of cardiac pacemaker: Secondary | ICD-10-CM | POA: Insufficient documentation

## 2019-01-11 DIAGNOSIS — Z7984 Long term (current) use of oral hypoglycemic drugs: Secondary | ICD-10-CM | POA: Diagnosis not present

## 2019-01-11 LAB — BASIC METABOLIC PANEL
ANION GAP: 9 (ref 5–15)
BUN: 16 mg/dL (ref 8–23)
CO2: 27 mmol/L (ref 22–32)
Calcium: 9.1 mg/dL (ref 8.9–10.3)
Chloride: 93 mmol/L — ABNORMAL LOW (ref 98–111)
Creatinine, Ser: 0.86 mg/dL (ref 0.44–1.00)
GFR calc non Af Amer: >60 mL/min (ref >60)
GLUCOSE: 187 mg/dL — AB (ref 70–99)
Potassium: 3.6 mmol/L (ref 3.5–5.1)
Sodium: 129 mmol/L — ABNORMAL LOW (ref 135–145)

## 2019-01-11 LAB — CBC
HCT: 38.7 % (ref 36.0–46.0)
Hemoglobin: 12.6 g/dL (ref 12.0–15.0)
MCH: 29.7 pg (ref 26.0–34.0)
MCHC: 32.6 g/dL (ref 30.0–36.0)
MCV: 91.3 fL (ref 80.0–100.0)
NRBC: 0 % (ref 0.0–0.2)
Platelets: 220 10*3/uL (ref 150–400)
RBC: 4.24 MIL/uL (ref 3.87–5.11)
RDW: 12.5 % (ref 11.5–15.5)
WBC: 7 10*3/uL (ref 4.0–10.5)

## 2019-01-11 LAB — SEDIMENTATION RATE: Sed Rate: 13 mm/hr (ref 0–22)

## 2019-01-11 MED ORDER — SODIUM CHLORIDE 0.9 % IV BOLUS
1000.0000 mL | Freq: Once | INTRAVENOUS | Status: AC
  Administered 2019-01-11: 1000 mL via INTRAVENOUS

## 2019-01-11 MED ORDER — ACETAMINOPHEN 500 MG PO TABS
1000.0000 mg | ORAL_TABLET | Freq: Once | ORAL | Status: AC
  Administered 2019-01-11: 1000 mg via ORAL
  Filled 2019-01-11: qty 2

## 2019-01-11 MED ORDER — IOHEXOL 350 MG/ML SOLN
100.0000 mL | Freq: Once | INTRAVENOUS | Status: AC | PRN
  Administered 2019-01-11: 100 mL via INTRAVENOUS

## 2019-01-11 NOTE — ED Notes (Signed)
ED Provider at bedside. 

## 2019-01-11 NOTE — ED Notes (Signed)
Patient transported to CT 

## 2019-01-11 NOTE — ED Provider Notes (Signed)
[facility] [place] EMERGENCY DEPARTMENT Provider Note   CSN: [id number] Arrival date & time: 01/11/19  1924    History   Chief Complaint Chief Complaint  Patient presents with   Headache    HPI Brooke Pitts is a 73 y.o. female.     73yo F w/ PMH including A fib on Eliquis, T2DM, HTN, CVA, pacemaker, Crohn's disease, OSA who p/w headache. 2 nights ago, she began having sudden, severe headache associated w/ nausea and dizziness that she describes as lightheadedness. It resolved after a short time. Yesterday, she had no headaches but felt bad with nausea and lightheadedness most of the day. Around 11:30am today, her headache returned then resolved after ~1 hour. Around 6:30-7pm tonight, headache returned and she describes it as like a lightening strike on the top of her head. Occasional slight blurry vision but no double vision or loss of vision. No vomiting, extremity numbness/weakness, or balance problems. She denies head injury or fall. Has a cough "once in a blue moon" but no URI symptoms or fever. No CP or SOB. She notes that she has been taking allergy medication and dramamine for the past 4 days for dizziness and allergy symptoms.  The history is provided by the patient.  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   Obesity (BMI 30.0-34.9) 09/18/2016   Risk for falls 08/21/2016   Low magnesium level 07/08/2016   Hypokalemia 06/30/2016   Diabetic mononeuropathy associated with type 2 diabetes mellitus ([place]) 02/15/2016   Vitamin D deficiency 12/03/2015   SSS (sick sinus syndrome) ([place]) 11/20/2015   S/P placement of cardiac pacemaker 11/17/2015   BPPV (benign paroxysmal positional vertigo)  11/17/2015   CPAP/BiPAP dependence 11/17/2015   Crohn's disease of ileum ([place]) 11/17/2015   Disorder of iron metabolism 11/17/2015   History of cataract 11/17/2015   History of osteopenia 11/17/2015   OSA on CPAP 11/17/2015   GERD (gastroesophageal reflux disease) 11/03/2015   Diabetes mellitus ([place]) 11/02/2015   History of CVA (cerebrovascular accident) 10/18/2015   Long term current use of anticoagulant therapy 10/18/2015   Essential hypertension 10/03/2015   Hyperlipidemia LDL goal <100 10/03/2015   Type 2 diabetes mellitus with diabetic polyneuropathy, without long-term current use of insulin ([place]) 10/03/2015    Past Surgical History:  Procedure Laterality Date   ABDOMINAL HYSTERECTOMY     CHOLECYSTECTOMY     PACEMAKER INSERTION     TONSILLECTOMY       OB History    Gravida  5   Para  4   Term  4   Preterm  0   AB  1   Living  3     SAB  1   TAB  0   Ectopic  0   Multiple  0   Live Births  4            Home Medications    Prior to Admission medications   Medication Sig Start Date End Date Taking? Authorizing Provider  apixaban (ELIQUIS) 5 MG TABS tablet Take 1 tablet (5 mg total) by mouth 2 (two) times daily. 01/27/18   Revankar, Reita Cliche, MD  atenolol (TENORMIN) 50 MG tablet Take 50  mg by mouth daily.    [provider]  b complex vitamins tablet Take 1 tablet by mouth daily.    [provider]  Calcium Carbonate-Vitamin D (CALCIUM + D PO) Take by mouth.    [provider]  DimenhyDRINATE (DRAMAMINE PO) Take 1 tablet by mouth daily as needed.    [provider]  hydrochlorothiazide (HYDRODIURIL) 25 MG tablet Take 25 mg by mouth daily. 06/18/17   [provider]  irbesartan (AVAPRO) 300 MG tablet Take 300 mg by mouth daily. 06/18/17   [provider]  magnesium oxide (MAG-OX) 400 MG tablet Take 400 mg by mouth daily.    [provider]  metFORMIN (GLUCOPHAGE) 500 MG tablet  Take by mouth 2 (two) times daily with a meal.    [provider]  nitroGLYCERIN (NITROSTAT) 0.4 MG SL tablet Place 1 tablet (0.4 mg total) under the tongue every 5 (five) minutes as needed for chest pain. 06/10/17 09/08/17  Revankar, Reita Cliche, MD  Omega-3 Fatty Acids (FISH OIL) 1200 MG CPDR Take by mouth.    [provider]  Jonetta Speak LANCETS 65K MISC  01/25/18   [provider]  [facility] VERIO test strip  01/25/18   [provider]  ranitidine (ZANTAC) 150 MG tablet Take 150 mg by mouth 2 (two) times daily.  01/21/18   [provider]  UNKNOWN TO PATIENT Another BP med    [provider]    Family History Family History  Problem Relation Age of Onset   Arrhythmia Mother    Atrial fibrillation Mother    Heart disease Brother    Heart failure Brother     Social History Social History   Tobacco Use   Smoking status: Never Smoker   Smokeless tobacco: Never Used  Substance Use Topics   Alcohol use: No   Drug use: No     Allergies   Cymbalta [duloxetine hcl]; Hydrocodone; Lisinopril; Amlodipine; Duloxetine; Losartan; and Nsaids   Review of Systems Review of Systems  Neurological: Positive for headaches.   All other systems reviewed and are negative except that which was mentioned in HPI   Physical Exam Updated Vital Signs BP 140/85    Pulse 74    Temp 98.6 F (37 C) (Oral)    Resp 15    Ht 5' 4"  (1.626 m)    Wt 99.3 kg    SpO2 100%    BMI 37.59 kg/m   Physical Exam Vitals signs and nursing note reviewed.  Constitutional:      General: She is not in acute distress.    Appearance: She is well-developed.     Comments: Awake, alert  HENT:     Head: Normocephalic and atraumatic.     Comments: No tenderness over temples Eyes:     Extraocular Movements: Extraocular movements intact.     Conjunctiva/sclera: Conjunctivae normal.     Pupils: Pupils are equal, round, and reactive to light.  Neck:      Musculoskeletal: Neck supple.  Cardiovascular:     Rate and Rhythm: Normal rate and regular rhythm.     Heart sounds: Normal heart sounds. No murmur.  Pulmonary:     Effort: Pulmonary effort is normal. No respiratory distress.     Breath sounds: Normal breath sounds.  Abdominal:     General: Bowel sounds are normal. There is no distension.     Palpations: Abdomen is soft.     Tenderness: There is no abdominal tenderness.  Skin:    General: Skin is warm and dry.  Neurological:     Mental Status: She is alert and oriented to person, place, and time.     Cranial Nerves: No cranial nerve deficit.     Motor: No abnormal muscle tone.     Deep Tendon Reflexes: Reflexes are normal and symmetric.     Comments: Fluent speech, normal finger-to-nose testing, negative pronator drift, no clonus 5/5 strength and normal sensation x all 4 extremities  Psychiatric:        Thought Content: Thought content normal.        Judgment: Judgment normal.      ED Treatments / Results  Labs (all labs ordered are listed, but only abnormal results are displayed) Labs Reviewed  BASIC METABOLIC PANEL - Abnormal; Notable for the following components:      Result Value   Sodium 129 (*)    Chloride 93 (*)    Glucose, Bld 187 (*)    All other components within normal limits  CBC  SEDIMENTATION RATE  C-REACTIVE PROTEIN    EKG EKG Interpretation  Date/Time:  Tuesday January 11 2019 20:13:10 EDT Ventricular Rate:  73 PR Interval:    QRS Duration: 90 QT Interval:  397 QTC Calculation: 438 R Axis:   -18 Text Interpretation:  Sinus rhythm Borderline left axis deviation Low voltage, precordial leads Abnormal R-wave progression, early transition Baseline wander in lead(s) V1 since previous tracing, no longer paced rhythm, no ischemic changes Confirmed by Theotis Burrow [phone]) on 01/11/2019 8:25:55 PM   Radiology Ct Angio Head W Or Wo Contrast  Result Date: 01/11/2019 CLINICAL DATA:  Severe LEFT headache  with dizziness and nausea for 3 days. History of hypertension, atrial fibrillation, diabetes and stroke. EXAM: CT ANGIOGRAPHY HEAD AND NECK TECHNIQUE: Multidetector CT imaging of the head and neck was performed using the standard protocol during bolus administration of intravenous contrast. Multiplanar CT image reconstructions and MIPs were obtained to evaluate the vascular anatomy. Carotid stenosis measurements (when applicable) are obtained utilizing NASCET criteria, using the distal internal carotid diameter as the denominator. CONTRAST:  120m OMNIPAQUE IOHEXOL 350 MG/ML SOLN COMPARISON:  CT HEAD January 11, 2019 FINDINGS: CTA NECK FINDINGS: AORTIC ARCH: Normal appearance of the thoracic arch, normal branch pattern. Trace calcific atherosclerosis aortic arch. The origins of the innominate, left Common carotid artery and subclavian artery are patent. RIGHT CAROTID SYSTEM: Common carotid artery is patent. Normal appearance of the carotid bifurcation without hemodynamically significant stenosis by NASCET criteria. Normal appearance of the internal carotid artery. LEFT CAROTID SYSTEM: Common carotid artery is patent. Normal appearance of the carotid bifurcation without hemodynamically significant stenosis by NASCET criteria. Normal appearance of the internal carotid artery. VERTEBRAL ARTERIES:Left vertebral artery is dominant. Limited assessment of vertebral arteries due to streak artifact from venous contamination. SKELETON: No acute osseous process though bone windows have not been submitted. OTHER NECK: Soft tissues of the neck are nonacute though, not tailored for evaluation. Subcentimeter RIGHT thyroid nodule, below size follow-up recommendation. UPPER CHEST: Included lung apices are clear. No superior mediastinal lymphadenopathy. Streak artifact from LEFT cardiac pacemaker battery pack. CTA HEAD FINDINGS: ANTERIOR CIRCULATION: Patent cervical internal carotid arteries, petrous, cavernous and supra clinoid  internal carotid arteries. Patent anterior and middle cerebral arteries. No large vessel occlusion, flow-limiting stenosis, contrast extravasation or aneurysm. POSTERIOR CIRCULATION: Patent vertebral arteries, vertebrobasilar junction and basilar artery, as well as main branch vessels. Patent posterior cerebral arteries. Small bilateral posterior communicating arteries present. No large vessel  occlusion, flow-limiting stenosis, contrast extravasation or aneurysm. VENOUS SINUSES: Major dural venous sinuses are patent though not tailored for evaluation on this angiographic examination. ANATOMIC VARIANTS: Aplastic RIGHT A1 segment. DELAYED PHASE: No abnormal intracranial enhancement. MIP images reviewed. IMPRESSION: 1. Negative CTA NECK; technically limited assessment of vertebral arteries. 2. Negative CTA HEAD. Electronically Signed   By: Elon Alas M.D.   On: 01/11/2019 22:09   Ct Head Wo Contrast  Result Date: 01/11/2019 CLINICAL DATA:  Thunderclap headache. On Eliquis. EXAM: CT HEAD WITHOUT CONTRAST TECHNIQUE: Contiguous axial images were obtained from the base of the skull through the vertex without intravenous contrast. COMPARISON:  06/23/2017 FINDINGS: Brain: There is no mass, hemorrhage or extra-axial collection. The size and configuration of the ventricles and extra-axial CSF spaces are normal. There is hypoattenuation of the white matter, most commonly indicating chronic small vessel disease. Vascular: No abnormal hyperdensity of the major intracranial arteries or dural venous sinuses. No intracranial atherosclerosis. Skull: The visualized skull base, calvarium and extracranial soft tissues are normal. Sinuses/Orbits: No fluid levels or advanced mucosal thickening of the visualized paranasal sinuses. No mastoid or middle ear effusion. The orbits are normal. IMPRESSION: 1. No acute intracranial abnormality. 2. Findings of chronic small vessel disease. Electronically Signed   By: Ulyses Jarred M.D.    On: 01/11/2019 20:39   Ct Angio Neck W And/or Wo Contrast  Result Date: 01/11/2019 CLINICAL DATA:  Severe LEFT headache with dizziness and nausea for 3 days. History of hypertension, atrial fibrillation, diabetes and stroke. EXAM: CT ANGIOGRAPHY HEAD AND NECK TECHNIQUE: Multidetector CT imaging of the head and neck was performed using the standard protocol during bolus administration of intravenous contrast. Multiplanar CT image reconstructions and MIPs were obtained to evaluate the vascular anatomy. Carotid stenosis measurements (when applicable) are obtained utilizing NASCET criteria, using the distal internal carotid diameter as the denominator. CONTRAST:  131m OMNIPAQUE IOHEXOL 350 MG/ML SOLN COMPARISON:  CT HEAD January 11, 2019 FINDINGS: CTA NECK FINDINGS: AORTIC ARCH: Normal appearance of the thoracic arch, normal branch pattern. Trace calcific atherosclerosis aortic arch. The origins of the innominate, left Common carotid artery and subclavian artery are patent. RIGHT CAROTID SYSTEM: Common carotid artery is patent. Normal appearance of the carotid bifurcation without hemodynamically significant stenosis by NASCET criteria. Normal appearance of the internal carotid artery. LEFT CAROTID SYSTEM: Common carotid artery is patent. Normal appearance of the carotid bifurcation without hemodynamically significant stenosis by NASCET criteria. Normal appearance of the internal carotid artery. VERTEBRAL ARTERIES:Left vertebral artery is dominant. Limited assessment of vertebral arteries due to streak artifact from venous contamination. SKELETON: No acute osseous process though bone windows have not been submitted. OTHER NECK: Soft tissues of the neck are nonacute though, not tailored for evaluation. Subcentimeter RIGHT thyroid nodule, below size follow-up recommendation. UPPER CHEST: Included lung apices are clear. No superior mediastinal lymphadenopathy. Streak artifact from LEFT cardiac pacemaker battery pack. CTA  HEAD FINDINGS: ANTERIOR CIRCULATION: Patent cervical internal carotid arteries, petrous, cavernous and supra clinoid internal carotid arteries. Patent anterior and middle cerebral arteries. No large vessel occlusion, flow-limiting stenosis, contrast extravasation or aneurysm. POSTERIOR CIRCULATION: Patent vertebral arteries, vertebrobasilar junction and basilar artery, as well as main branch vessels. Patent posterior cerebral arteries. Small bilateral posterior communicating arteries present. No large vessel occlusion, flow-limiting stenosis, contrast extravasation or aneurysm. VENOUS SINUSES: Major dural venous sinuses are patent though not tailored for evaluation on this angiographic examination. ANATOMIC VARIANTS: Aplastic RIGHT A1 segment. DELAYED PHASE: No abnormal intracranial enhancement. MIP images reviewed. IMPRESSION:  1. Negative CTA NECK; technically limited assessment of vertebral arteries. 2. Negative CTA HEAD. Electronically Signed   By: Elon Alas M.D.   On: 01/11/2019 22:09    Procedures Procedures (including critical care time)  Medications Ordered in ED Medications  acetaminophen (TYLENOL) tablet 1,000 mg (1,000 mg Oral Given 01/11/19 2042)  sodium chloride 0.9 % bolus 1,000 mL (1,000 mLs Intravenous New Bag/Given 01/11/19 2155)  iohexol (OMNIPAQUE) 350 MG/ML injection 100 mL (100 mLs Intravenous Contrast Given 01/11/19 2139)     Initial Impression / Assessment and Plan / ED Course  I have reviewed the triage vital signs and the nursing notes.  Pertinent labs & imaging results that were available during my care of the patient were reviewed by me and considered in my medical decision making (see chart for details).        Neuro intact on exam, mild hypertension. DDx includes migraine, SAH, temporal arteritis, brain mass. Obtained CT head and CTA H/N which were negative for acute hemorrhage or vascular problem. Given head CT obtained within 6 hours of onset of acute  headache tonight, I feel likelihood of SAH is extremely low. PT's headache was very mild in ED, gave tylenol and IVF bolus. Labs notable for Na 129, Cl 93. ESR normal making temporal arteritis unlikely. She does admit to drinking lots of water due to dry mouth. Recommended mints/gum and moderate water intake as well as recheck of sodium by PCP. Regarding HA, it is almost resolved currently. Have discussed supportive measures and extensively reviewed return precautions.   Final Clinical Impressions(s) / ED Diagnoses   Final diagnoses:  Intermittent headache  Hyponatremia    ED Discharge Orders    None       Leyda Vanderwerf, Wenda Overland, MD 01/11/19 2242

## 2019-01-11 NOTE — ED Notes (Signed)
Pt reports HA has has been intermittent for three days, but then tonight pt described it coming on like "lightning." Pt also reports dizziness that has been worse with position changes. Pt states now the dizziness is constant and unchanged with position. Pt denies photophobia. EDP notified of pt's symptoms.

## 2019-01-11 NOTE — ED Triage Notes (Addendum)
Pt c/o pain to "top of head" x 3 days-dizziness,nausea x 5 days-to triage in w/c-NAD

## 2019-01-12 LAB — C-REACTIVE PROTEIN: CRP: 1.5 mg/dL — ABNORMAL HIGH (ref <1.0)

## 2019-01-17 ENCOUNTER — Ambulatory Visit (INDEPENDENT_AMBULATORY_CARE_PROVIDER_SITE_OTHER): Payer: Medicare Other | Admitting: *Deleted

## 2019-01-17 ENCOUNTER — Other Ambulatory Visit: Payer: Self-pay

## 2019-01-17 DIAGNOSIS — I480 Paroxysmal atrial fibrillation: Secondary | ICD-10-CM

## 2019-01-17 DIAGNOSIS — I495 Sick sinus syndrome: Secondary | ICD-10-CM

## 2019-01-17 LAB — CUP PACEART REMOTE DEVICE CHECK
Battery Remaining Longevity: 64 mo
Battery Voltage: 3 V
Brady Statistic AP VP Percent: 0.04 %
Brady Statistic AP VS Percent: 84.56 %
Brady Statistic AS VP Percent: 0.03 %
Brady Statistic AS VS Percent: 15.37 %
Brady Statistic RA Percent Paced: 83.67 %
Brady Statistic RV Percent Paced: 0.07 %
Date Time Interrogation Session: 20200406162123
Implantable Lead Implant Date: 20150114
Implantable Lead Implant Date: 20150114
Implantable Lead Location: 753859
Implantable Lead Location: 753860
Implantable Lead Model: 5076
Implantable Lead Model: 5076
Implantable Pulse Generator Implant Date: 20150114
Lead Channel Impedance Value: 323 Ohm
Lead Channel Impedance Value: 418 Ohm
Lead Channel Impedance Value: 418 Ohm
Lead Channel Impedance Value: 551 Ohm
Lead Channel Pacing Threshold Amplitude: 0.625 V
Lead Channel Pacing Threshold Amplitude: 0.75 V
Lead Channel Pacing Threshold Pulse Width: 0.4 ms
Lead Channel Pacing Threshold Pulse Width: 0.4 ms
Lead Channel Sensing Intrinsic Amplitude: 1.75 mV
Lead Channel Sensing Intrinsic Amplitude: 1.75 mV
Lead Channel Sensing Intrinsic Amplitude: 17.125 mV
Lead Channel Sensing Intrinsic Amplitude: 17.125 mV
Lead Channel Setting Pacing Amplitude: 1.5 V
Lead Channel Setting Pacing Amplitude: 2 V
Lead Channel Setting Pacing Pulse Width: 0.4 ms
Lead Channel Setting Sensing Sensitivity: 0.9 mV

## 2019-01-22 ENCOUNTER — Other Ambulatory Visit: Payer: Self-pay | Admitting: Cardiology

## 2019-01-24 ENCOUNTER — Telehealth: Payer: Self-pay | Admitting: Cardiology

## 2019-01-24 NOTE — Telephone Encounter (Signed)
Spoke with patient to discuss transmission results. Pt reports she was having some dizziness and headaches in March and her PCP told to f/u regarding any episodes noted on her PPM. Discussed dates/times of AF episode and NSVT episode--no correlating dizziness/headaches on those days. Pt verbalizes appreciation of call and denies questions or concerns at this time.

## 2019-01-24 NOTE — Telephone Encounter (Signed)
°  1. Has your device fired? no  2. Is you device beeping? no  3. Are you experiencing draining or swelling at device site?   4. Are you calling to see if we received your device transmission?   5. Have you passed out?   She said she received her results on My Chart from her last transmission she has some questions she would like to ask about it.   Please route to Device Clinic Pool

## 2019-01-26 ENCOUNTER — Telehealth: Payer: Self-pay

## 2019-01-26 NOTE — Telephone Encounter (Signed)
RN called patient to schedule for 6 mo f/u. She was recently seen at [facility] for headaches/cardiovascular issues and wore a monitor. Records request sent to [facility] and consent request sent to pt via mychart.Patient instructed to have home BP cuff available, weigh herself and all medication out for reconciliation/refills. Patient scheduled for virtual visit and had no further questions.

## 2019-01-26 NOTE — Progress Notes (Signed)
Remote pacemaker transmission.   

## 2019-01-27 ENCOUNTER — Other Ambulatory Visit: Payer: Self-pay

## 2019-01-27 ENCOUNTER — Telehealth (INDEPENDENT_AMBULATORY_CARE_PROVIDER_SITE_OTHER): Payer: Medicare Other | Admitting: Cardiology

## 2019-01-27 ENCOUNTER — Encounter: Payer: Self-pay | Admitting: Cardiology

## 2019-01-27 VITALS — BP 141/87 | HR 89 | Ht 64.0 in | Wt 222.0 lb

## 2019-01-27 DIAGNOSIS — I472 Ventricular tachycardia: Secondary | ICD-10-CM | POA: Insufficient documentation

## 2019-01-27 DIAGNOSIS — I4729 Other ventricular tachycardia: Secondary | ICD-10-CM

## 2019-01-27 DIAGNOSIS — I480 Paroxysmal atrial fibrillation: Secondary | ICD-10-CM | POA: Diagnosis not present

## 2019-01-27 DIAGNOSIS — E785 Hyperlipidemia, unspecified: Secondary | ICD-10-CM

## 2019-01-27 DIAGNOSIS — Z7901 Long term (current) use of anticoagulants: Secondary | ICD-10-CM

## 2019-01-27 DIAGNOSIS — Z950 Presence of cardiac pacemaker: Secondary | ICD-10-CM

## 2019-01-27 DIAGNOSIS — I10 Essential (primary) hypertension: Secondary | ICD-10-CM

## 2019-01-27 HISTORY — DX: Ventricular tachycardia: I47.2

## 2019-01-27 HISTORY — DX: Other ventricular tachycardia: I47.29

## 2019-01-27 MED ORDER — AMIODARONE HCL 200 MG PO TABS: ORAL_TABLET | ORAL | 4 refills | Status: DC

## 2019-01-27 NOTE — Addendum Note (Signed)
Addended by: Pamala Hurry on: 01/27/2019 01:43 PM   Modules accepted: Orders

## 2019-01-27 NOTE — Addendum Note (Signed)
Addended by: Pamala Hurry on: 01/27/2019 03:40 PM   Modules accepted: Orders

## 2019-01-27 NOTE — Patient Instructions (Signed)
Medication Instructions:  STOP taking cardizem START taking amiodarone 200 mg (2 tablets) daily for 2 weeks then decrease to 200 mg (1 tablet) daily for 2 weeks and then subsequently 100 mg (0.5 tablets) daily   If you need a refill on your cardiac medications before your next appointment, please call your pharmacy.   Lab work: NONE today. Your physician recommends that you have a BMP and lipid panel drawn in next week. You do not need an appt, just walk into office M-F 8-4 EXCEPT 12-1. You will need to be FASTING for these labs.   If you have labs (blood work) drawn today and your tests are completely normal, you will receive your results only by: Marland Kitchen MyChart Message (if you have MyChart) OR . A paper copy in the mail If you have any lab test that is abnormal or we need to change your treatment, we will call you to review the results.  Testing/Procedures: NONE  Follow-Up: At [facility], you and your health needs are our priority.  As part of our continuing mission to provide you with exceptional heart care, we have created designated Provider Care Teams.  These Care Teams include your primary Cardiologist (physician) and Advanced Practice Providers (APPs -  Physician Assistants and Nurse Practitioners) who all work together to provide you with the care you need, when you need it. You will need a follow up appointment in 1 weeks.     Any Other Special Instructions Will Be Listed Below Amiodarone tablets What is this medicine? AMIODARONE (a MEE oh da rone) is an antiarrhythmic drug. It helps make your heart beat regularly. Because of the side effects caused by this medicine, it is only used when other medicines have not worked. It is usually used for heartbeat problems that may be life threatening. This medicine may be used for other purposes; ask your health care provider or pharmacist if you have questions. COMMON BRAND NAME(S): Cordarone, Pacerone What should I tell my health care  provider before I take this medicine? They need to know if you have any of these conditions: -liver disease -lung disease -other heart problems -thyroid disease -an unusual or allergic reaction to amiodarone, iodine, other medicines, foods, dyes, or preservatives -pregnant or trying to get pregnant -breast-feeding How should I use this medicine? Take this medicine by mouth with a glass of water. Follow the directions on the prescription label. You can take this medicine with or without food. However, you should always take it the same way each time. Take your doses at regular intervals. Do not take your medicine more often than directed. Do not stop taking except on the advice of your doctor or health care professional. A special MedGuide will be given to you by the pharmacist with each prescription and refill. Be sure to read this information carefully each time. Talk to your pediatrician regarding the use of this medicine in children. Special care may be needed. Overdosage: If you think you have taken too much of this medicine contact a poison control center or emergency room at once. NOTE: This medicine is only for you. Do not share this medicine with others. What if I miss a dose? If you miss a dose, take it as soon as you can. If it is almost time for your next dose, take only that dose. Do not take double or extra doses. What may interact with this medicine? Do not take this medicine with any of the following medications: -abarelix -apomorphine -arsenic trioxide -  certain antibiotics like erythromycin, gemifloxacin, levofloxacin, pentamidine -certain medicines for depression like amoxapine, tricyclic antidepressants -certain medicines for fungal infections like fluconazole, itraconazole, ketoconazole, posaconazole, voriconazole -certain medicines for irregular heart beat like disopyramide, dofetilide, dronedarone, ibutilide, propafenone, sotalol -certain medicines for malaria like  chloroquine, halofantrine -cisapride -droperidol -haloperidol -hawthorn -maprotiline -methadone -phenothiazines like chlorpromazine, mesoridazine, thioridazine -pimozide -ranolazine -red yeast rice -vardenafil -ziprasidone This medicine may also interact with the following medications: -antiviral medicines for HIV or AIDS -certain medicines for blood pressure, heart disease, irregular heart beat -certain medicines for cholesterol like atorvastatin, cerivastatin, lovastatin, simvastatin -certain medicines for hepatitis C like sofosbuvir and ledipasvir; sofosbuvir -certain medicines for seizures like phenytoin -certain medicines for thyroid problems -certain medicines that treat or prevent blood clots like warfarin -cholestyramine -cimetidine -clopidogrel -cyclosporine -dextromethorphan -diuretics -fentanyl -general anesthetics -grapefruit juice -lidocaine -loratadine -methotrexate -other medicines that prolong the QT interval (cause an abnormal heart rhythm) -procainamide -quinidine -rifabutin, rifampin, or rifapentine -St. John's Wort -trazodone This list may not describe all possible interactions. Give your health care provider a list of all the medicines, herbs, non-prescription drugs, or dietary supplements you use. Also tell them if you smoke, drink alcohol, or use illegal drugs. Some items may interact with your medicine. What should I watch for while using this medicine? Your condition will be monitored closely when you first begin therapy. Often, this drug is first started in a hospital or other monitored health care setting. Once you are on maintenance therapy, visit your doctor or health care professional for regular checks on your progress. Because your condition and use of this medicine carry some risk, it is a good idea to carry an identification card, necklace or bracelet with details of your condition, medications, and doctor or health care professional. Bonita Quin  may get drowsy or dizzy. Do not drive, use machinery, or do anything that needs mental alertness until you know how this medicine affects you. Do not stand or sit up quickly, especially if you are an older patient. This reduces the risk of dizzy or fainting spells. This medicine can make you more sensitive to the sun. Keep out of the sun. If you cannot avoid being in the sun, wear protective clothing and use sunscreen. Do not use sun lamps or tanning beds/booths. You should have regular eye exams before and during treatment. Call your doctor if you have blurred vision, see halos, or your eyes become sensitive to light. Your eyes may get dry. It may be helpful to use a lubricating eye solution or artificial tears solution. If you are going to have surgery or a procedure that requires contrast dyes, tell your doctor or health care professional that you are taking this medicine. What side effects may I notice from receiving this medicine? Side effects that you should report to your doctor or health care professional as soon as possible: -allergic reactions like skin rash, itching or hives, swelling of the face, lips, or tongue -blue-gray coloring of the skin -blurred vision, seeing blue green halos, increased sensitivity of the eyes to light -breathing problems -chest pain -dark urine -fast, irregular heartbeat -feeling faint or light-headed -intolerance to heat or cold -nausea or vomiting -pain and swelling of the scrotum -pain, tingling, numbness in feet, hands -redness, blistering, peeling or loosening of the skin, including inside the mouth -spitting up blood -stomach pain -sweating -unusual or uncontrolled movements of body -unusually weak or tired -weight gain or loss -yellowing of the eyes or skin Side effects that usually do not require  medical attention (report to your doctor or health care professional if they continue or are bothersome): -change in sex drive or  performance -constipation -dizziness -headache -loss of appetite -trouble sleeping This list may not describe all possible side effects. Call your doctor for medical advice about side effects. You may report side effects to FDA at 1-800-FDA-1088. Where should I keep my medicine? Keep out of the reach of children. Store at room temperature between 20 and 25 degrees C (68 and 77 degrees F). Protect from light. Keep container tightly closed. Throw away any unused medicine after the expiration date. NOTE: This sheet is a summary. It may not cover all possible information. If you have questions about this medicine, talk to your doctor, pharmacist, or health care provider.  2019 Elsevier/Gold Standard (2014-01-02 19:48:11)

## 2019-01-27 NOTE — Progress Notes (Signed)
Virtual Visit via Video Note   This visit type was conducted due to national recommendations for restrictions regarding the COVID-19 Pandemic (e.g. social distancing) in an effort to limit this patient's exposure and mitigate transmission in our community.  Due to her co-morbid illnesses, this patient is at least at moderate risk for complications without adequate follow up.  This format is felt to be most appropriate for this patient at this time.  All issues noted in this document were discussed and addressed.  A limited physical exam was performed with this format.  Please refer to the patient's chart for her consent to telehealth for [facility].   Evaluation Performed:  Follow-up visit  Date:  01/27/2019   ID:  Brooke, Pitts [birth year], MRN [id number]  Patient Location: Home Provider Location: Home  PCP:  Loyal Jacobson, MD  Cardiologist:  No primary care provider on file.  Electrophysiologist:  Will Jorja Loa, MD   Chief Complaint:  Palpitations  History of Present Illness:    Brooke Pitts is a 73 y.o. female with.  Patient has history of paroxysmal atrial fibrillation.  She denies any problems at this time and takes care of activities of daily living from a cardiovascular standpoint.  No chest pain orthopnea or PND.  She was recently in the hospital and treated and released.  She was found to have hyponatremia.  At the time of my evaluation, the patient is alert awake oriented and in no distress.  Her pacemaker evaluation has revealed 2 hours and also  7 beat ventricular tachycardia.  The patient does not have symptoms concerning for COVID-19 infection (fever, chills, cough, or new shortness of breath).    Past Medical History:  Diagnosis Date  . A-fib (HCC)   . Diabetes mellitus without complication (HCC)   . Hypertension   . Stroke [facility])    Past Surgical History:  Procedure Laterality Date  . ABDOMINAL HYSTERECTOMY    . CHOLECYSTECTOMY    .  PACEMAKER INSERTION    . TONSILLECTOMY       Current Meds  Medication Sig  . atenolol (TENORMIN) 50 MG tablet Take 50 mg by mouth daily.  Marland Kitchen b complex vitamins tablet Take 1 tablet by mouth daily.  Marland Kitchen CARTIA XT 240 MG 24 hr capsule Take 240 mg by mouth daily.  Marland Kitchen ELIQUIS 5 MG TABS tablet Take 1 tablet by mouth twice daily  . hydrochlorothiazide (HYDRODIURIL) 25 MG tablet Take 25 mg by mouth daily.  . irbesartan (AVAPRO) 300 MG tablet Take 300 mg by mouth daily.  . magnesium oxide (MAG-OX) 400 MG tablet Take 400 mg by mouth daily.  . metFORMIN (GLUCOPHAGE) 500 MG tablet Take by mouth 2 (two) times daily with a meal.  . Omega-3 Fatty Acids (FISH OIL) 1200 MG CPDR Take by mouth.  [name] DELICA LANCETS 33G MISC   . ONETOUCH VERIO test strip   . UNKNOWN TO PATIENT Another BP med     Allergies:   Cymbalta [duloxetine hcl]; Hydrocodone; Lisinopril; Amlodipine; Duloxetine; Losartan; and Nsaids   Social History   Tobacco Use  . Smoking status: Never Smoker  . Smokeless tobacco: Never Used  Substance Use Topics  . Alcohol use: No  . Drug use: No     Family Hx: The patient's family history includes Arrhythmia in her mother; Atrial fibrillation in her mother; Heart disease in her brother; Heart failure in her brother.  ROS:   Please see the history of present illness.  No chest pain orthopnea PND or palpitations All other systems reviewed and are negative.   Prior CV studies:   The following studies were reviewed today:  The pacemaker evaluation was reviewed  Labs/Other Tests and Data Reviewed:    EKG:  No ECG reviewed.  Recent Labs: 01/11/2019: BUN 16; Creatinine, Ser 0.86; Hemoglobin 12.6; Platelets 220; Potassium 3.6; Sodium 129   Recent Lipid Panel No results found for: CHOL, TRIG, HDL, CHOLHDL, LDLCALC, LDLDIRECT  Wt Readings from Last 3 Encounters:  01/27/19 222 lb (100.7 kg)  01/11/19 219 lb (99.3 kg)  08/23/18 226 lb (102.5 kg)     Objective:    Vital  Signs:  BP (!) 141/87 (BP Location: Left Arm, Patient Position: Sitting, Cuff Size: Normal)   Pulse 89   Ht 5\' 4"  (1.626 m)   Wt 222 lb (100.7 kg)   BMI 38.11 kg/m    Well nourished, well developed female in no acute distress. Patient appeared comfortable over the video conference and and noticed  ASSESSMENT & PLAN:    1. Paroxysmal atrial fibrillation: In view of the above and the fact that she had an episode of significant atrial fibrillation I will make some changes in her medications.  I will stop her Cardizem.  I will initiate her home amiodarone 400 mg daily for 2 weeks then 200 mg daily for 2 weeks and then subsequently 100 mg daily.  Benefits and potential risks of these medications were explained to her and she vocalized understanding.  She will go for chest x-ray as a baseline.  She will also have blood work including Chem-7 and a lipid panel in the next day or 2. 2. Essential hypertension: Her blood pressure is stable at this time. 3. Hyponatremia: In view of the above I would like the patient to get the blood work mentioned above. 4. She will be seen in follow-up appointment in a week or earlier if she has any concerns.  She knows to go to the nearest emergency room for any significant concerns.  I also mentioned the patient that in view of the above she will need to get stress test to assess nonsustained ventricular tachycardia but in view of the viral pandemic will go to delayed for the next several weeks.  She is asymptomatic and ambulates well without any problems.  COVID-19 Education: The signs and symptoms of COVID-19 were discussed with the patient and how to seek care for testing (follow up with PCP or arrange E-visit).  The importance of social distancing was discussed today.  Time:   Today, I have spent 27 minutes with the patient with telehealth technology discussing the above problems.     Medication Adjustments/Labs and Tests Ordered: Current medicines are reviewed  at length with the patient today.  Concerns regarding medicines are outlined above.   Tests Ordered: No orders of the defined types were placed in this encounter.   Medication Changes: No orders of the defined types were placed in this encounter.   Disposition:  Follow up in 1 week(s)  Signed, Garwin Brothersajan R Sargent Mankey, MD  01/27/2019 12:34 PM    [facility]

## 2019-01-28 ENCOUNTER — Ambulatory Visit (HOSPITAL_BASED_OUTPATIENT_CLINIC_OR_DEPARTMENT_OTHER)
Admission: RE | Admit: 2019-01-28 | Discharge: 2019-01-28 | Disposition: A | Discharge status: Home / Self Care | Payer: Medicare Other | Source: Ambulatory Visit | Attending: Cardiology | Admitting: Cardiology

## 2019-01-28 ENCOUNTER — Telehealth: Payer: Self-pay

## 2019-01-28 DIAGNOSIS — I480 Paroxysmal atrial fibrillation: Secondary | ICD-10-CM | POA: Insufficient documentation

## 2019-01-28 DIAGNOSIS — Z7901 Long term (current) use of anticoagulants: Secondary | ICD-10-CM | POA: Insufficient documentation

## 2019-01-28 DIAGNOSIS — I472 Ventricular tachycardia: Secondary | ICD-10-CM | POA: Insufficient documentation

## 2019-01-28 DIAGNOSIS — I10 Essential (primary) hypertension: Secondary | ICD-10-CM | POA: Insufficient documentation

## 2019-01-28 MED ORDER — AMIODARONE HCL 200 MG PO TABS: ORAL_TABLET | ORAL | 4 refills | Status: DC

## 2019-01-28 NOTE — Telephone Encounter (Signed)
Patient phoned and informed of normal chest xray results. Verbalizes understanding and has no questions or concerns at this time.

## 2019-01-28 NOTE — Addendum Note (Signed)
Addended by: Fayrene Fearing B on: 01/28/2019 08:19 AM   Modules accepted: Orders

## 2019-01-29 LAB — LIPID PANEL
Chol/HDL Ratio: 3.1 ratio (ref 0.0–4.4)
Cholesterol, Total: 165 mg/dL (ref 100–199)
HDL: 53 mg/dL (ref >39)
LDL Calculated: 85 mg/dL (ref 0–99)
Triglycerides: 133 mg/dL (ref 0–149)
VLDL Cholesterol Cal: 27 mg/dL (ref 5–40)

## 2019-01-29 LAB — BASIC METABOLIC PANEL
BUN/Creatinine Ratio: 17 (ref 12–28)
BUN: 13 mg/dL (ref 8–27)
CO2: 24 mmol/L (ref 20–29)
Calcium: 9.2 mg/dL (ref 8.7–10.3)
Chloride: 95 mmol/L — ABNORMAL LOW (ref 96–106)
Creatinine, Ser: 0.78 mg/dL (ref 0.57–1.00)
GFR calc Af Amer: 88 mL/min/{1.73_m2} (ref >59)
GFR calc non Af Amer: 77 mL/min/{1.73_m2} (ref >59)
Glucose: 185 mg/dL — ABNORMAL HIGH (ref 65–99)
Potassium: 4 mmol/L (ref 3.5–5.2)
Sodium: 137 mmol/L (ref 134–144)

## 2019-02-03 ENCOUNTER — Encounter: Payer: Self-pay | Admitting: Cardiology

## 2019-02-03 ENCOUNTER — Telehealth (INDEPENDENT_AMBULATORY_CARE_PROVIDER_SITE_OTHER): Payer: Medicare Other | Admitting: Cardiology

## 2019-02-03 ENCOUNTER — Other Ambulatory Visit: Payer: Self-pay

## 2019-02-03 VITALS — BP 162/92 | HR 78 | Ht 64.0 in | Wt 224.0 lb

## 2019-02-03 DIAGNOSIS — E669 Obesity, unspecified: Secondary | ICD-10-CM

## 2019-02-03 DIAGNOSIS — I10 Essential (primary) hypertension: Secondary | ICD-10-CM

## 2019-02-03 DIAGNOSIS — I480 Paroxysmal atrial fibrillation: Secondary | ICD-10-CM

## 2019-02-03 DIAGNOSIS — Z8673 Personal history of transient ischemic attack (TIA), and cerebral infarction without residual deficits: Secondary | ICD-10-CM

## 2019-02-03 DIAGNOSIS — Z950 Presence of cardiac pacemaker: Secondary | ICD-10-CM

## 2019-02-03 MED ORDER — ATENOLOL 50 MG PO TABS: 75.0000 mg | ORAL_TABLET | Freq: Two times a day (BID) | ORAL | 2 refills | Status: DC

## 2019-02-03 NOTE — Addendum Note (Signed)
Addended by: Pamala Hurry on: 02/03/2019 01:23 PM   Modules accepted: Orders

## 2019-02-03 NOTE — Patient Instructions (Signed)
Medication Instructions:  INCREASE atenolol dose to 75 mg (twice daily)  If you need a refill on your cardiac medications before your next appointment, please call your pharmacy.   Lab work: NONE today, patient will come into [facility] office to have a CBC and liver function testing  If you have labs (blood work) drawn today and your tests are completely normal, you will receive your results only by: Marland Kitchen MyChart Message (if you have MyChart) OR . A paper copy in the mail If you have any lab test that is abnormal or we need to change your treatment, we will call you to review the results.  Testing/Procedures: Your physician has requested that you regularly monitor and record your blood pressure readings at home. Please use the same machine at the same time of day to check your readings and record them to bring to your follow-up visit.    Follow-Up: At [facility], you and your health needs are our priority.  As part of our continuing mission to provide you with exceptional heart care, we have created designated Provider Care Teams.  These Care Teams include your primary Cardiologist (physician) and Advanced Practice Providers (APPs -  Physician Assistants and Nurse Practitioners) who all work together to provide you with the care you need, when you need it. You will need a follow up appointment in 2 weeks.     Any Other Special Instructions Will Be Listed Below   Blood Pressure Record Sheet To take your blood pressure, you will need a blood pressure machine. You can buy a blood pressure machine (blood pressure monitor) at your clinic, drug store, or online. When choosing one, consider:  An automatic monitor that has an arm cuff.  A cuff that wraps snugly around your upper arm. You should be able to fit only one finger between your arm and the cuff.  A device that stores blood pressure reading results.  Do not choose a monitor that measures your blood pressure from your wrist or finger.  Follow your health care provider's instructions for how to take your blood pressure. To use this form:  Get one reading in the morning (a.m.) before you take any medicines.  Get one reading in the evening (p.m.) before supper.  Take at least 2 readings with each blood pressure check. This makes sure the results are correct. Wait 1-2 minutes between measurements.  Write down the results in the spaces on this form.  Repeat this once a week, or as told by your health care provider.  Make a follow-up appointment with your health care provider to discuss the results. Blood pressure log Date: _______________________  a.m. _____________________(1st reading) _____________________(2nd reading)  p.m. _____________________(1st reading) _____________________(2nd reading) Date: _______________________  a.m. _____________________(1st reading) _____________________(2nd reading)  p.m. _____________________(1st reading) _____________________(2nd reading) Date: _______________________  a.m. _____________________(1st reading) _____________________(2nd reading)  p.m. _____________________(1st reading) _____________________(2nd reading) Date: _______________________  a.m. _____________________(1st reading) _____________________(2nd reading)  p.m. _____________________(1st reading) _____________________(2nd reading) Date: _______________________  a.m. _____________________(1st reading) _____________________(2nd reading)  p.m. _____________________(1st reading) _____________________(2nd reading) This information is not intended to replace advice given to you by your health care provider. Make sure you discuss any questions you have with your health care provider. Document Released: 06/28/2003 Document Revised: 09/29/2017 Document Reviewed: 09/29/2017 Elsevier Interactive Patient Education  2019 [employer].

## 2019-02-03 NOTE — Progress Notes (Signed)
Virtual Visit via Video Note   This visit type was conducted due to national recommendations for restrictions regarding the COVID-19 Pandemic (e.g. social distancing) in an effort to limit this patient's exposure and mitigate transmission in our community.  Due to her co-morbid illnesses, this patient is at least at moderate risk for complications without adequate follow up.  This format is felt to be most appropriate for this patient at this time.  All issues noted in this document were discussed and addressed.  A limited physical exam was performed with this format.  Please refer to the patient's chart for her consent to telehealth for [facility].   Evaluation Performed:  Follow-up visit  Date:  02/03/2019   ID:  Blaine, Pasquarelli [birth year], MRN [id number]  Patient Location: Home Provider Location: Home  PCP:  Loyal Jacobson, MD  Cardiologist:  No primary care provider on file.  Electrophysiologist:  Will Jorja Loa, MD   Chief Complaint: Paroxysmal atrial fibrillation and nonsustained ventricular tachycardia  History of Present Illness:    Brooke Pitts is a 73 y.o. female with past medical history of essential hypertension, approximately fibrillation.  She denies any problems at this time and takes care of activities of daily living.  No chest pain orthopnea or PND.  The patient had episodes of approximately fibrillation on the pacemaker check and therefore she was initiated on amiodarone therapy.  I stopped her Cardizem.  She is doing fine.  She is asymptomatic.  Her blood pressure is elevated since that point in time.  She takes care of activities of daily living.  At the time of my evaluation, the patient is alert awake oriented and in no distress.  The patient does not have symptoms concerning for COVID-19 infection (fever, chills, cough, or new shortness of breath).    Past Medical History:  Diagnosis Date  . A-fib (HCC)   . Diabetes mellitus without  complication (HCC)   . Hypertension   . Stroke [facility])    Past Surgical History:  Procedure Laterality Date  . ABDOMINAL HYSTERECTOMY    . CHOLECYSTECTOMY    . PACEMAKER INSERTION    . TONSILLECTOMY       Current Meds  Medication Sig  . amiodarone (PACERONE) 200 MG tablet Please titrate dose as noted: start 400 mg (2 tablets) daily for 2 weeks then 200 mg (1 tablet) daily for 2 weeks and then subsequently 100 mg (0.5 tablets) daily (Patient taking differently: Take 400 mg by mouth daily. Please titrate dose as noted: start 400 mg (2 tablets) daily for 2 weeks then 200 mg (1 tablet) daily for 2 weeks and then subsequently 100 mg (0.5 tablets) daily)  . atenolol (TENORMIN) 50 MG tablet Take 50 mg by mouth daily.  Marland Kitchen b complex vitamins tablet Take 1 tablet by mouth daily.  Marland Kitchen ELIQUIS 5 MG TABS tablet Take 1 tablet by mouth twice daily  . hydrochlorothiazide (HYDRODIURIL) 25 MG tablet Take 25 mg by mouth daily.  . irbesartan (AVAPRO) 300 MG tablet Take 300 mg by mouth daily.  . magnesium oxide (MAG-OX) 400 MG tablet Take 400 mg by mouth daily.  . metFORMIN (GLUCOPHAGE) 500 MG tablet Take by mouth 2 (two) times daily with a meal.  . Omega-3 Fatty Acids (FISH OIL) 1200 MG CPDR Take by mouth.  [name] DELICA LANCETS 33G MISC   . ONETOUCH VERIO test strip   . UNKNOWN TO PATIENT Another BP med     Allergies:  Cymbalta [duloxetine hcl]; Hydrocodone; Lisinopril; Amlodipine; Duloxetine; Losartan; and Nsaids   Social History   Tobacco Use  . Smoking status: Never Smoker  . Smokeless tobacco: Never Used  Substance Use Topics  . Alcohol use: No  . Drug use: No     Family Hx: The patient's family history includes Arrhythmia in her mother; Atrial fibrillation in her mother; Heart disease in her brother; Heart failure in her brother.  ROS:   Please see the history of present illness.    Results of blood test were discussed with the patient All other systems reviewed and are negative.    Prior CV studies:   The following studies were reviewed today:  Results of blood test were discussed with the patient  Labs/Other Tests and Data Reviewed:    EKG:  No ECG reviewed.  Recent Labs: 01/11/2019: Hemoglobin 12.6; Platelets 220 01/28/2019: BUN 13; Creatinine, Ser 0.78; Potassium 4.0; Sodium 137   Recent Lipid Panel Lab Results  Component Value Date/Time   CHOL 165 01/28/2019 09:11 AM   TRIG 133 01/28/2019 09:11 AM   HDL 53 01/28/2019 09:11 AM   CHOLHDL 3.1 01/28/2019 09:11 AM   LDLCALC 85 01/28/2019 09:11 AM    Wt Readings from Last 3 Encounters:  02/03/19 224 lb (101.6 kg)  01/27/19 222 lb (100.7 kg)  01/11/19 219 lb (99.3 kg)     Objective:    Vital Signs:  BP (!) 162/92 (BP Location: Left Arm, Patient Position: Sitting, Cuff Size: Normal)   Pulse 78   Ht 5\' 4"  (1.626 m)   Wt 224 lb (101.6 kg)   BMI 38.45 kg/m    VITAL SIGNS:  reviewed  ASSESSMENT & PLAN:    1. Paroxysmal atrial fibrillation:I discussed with the patient atrial fibrillation, disease process. Management and therapy including rate and rhythm control, anticoagulation benefits and potential risks were discussed extensively with the patient. Patient had multiple questions which were answered to patient's satisfaction. 2. She is tolerating amiodarone well.  I noted chest x-ray report.  Will have a liver panel and a CBC.  [place] there today.  She is agreeable. 3. Her blood pressure is elevated and I have added amlodipine 5 mg daily to her regimen in case she will keep a track of her blood pressures twice a day.  It is to be noted that she takes atenolol 50 mg twice daily according to what she told me when I reviewed her medications 4. Follow-up appointment in 2 weeks or earlier if she has any concerns.  COVID-19 Education: The signs and symptoms of COVID-19 were discussed with the patient and how to seek care for testing (follow up with  PCP or arrange E-visit).  The importance of social distancing was discussed today.  Time:   Today, I have spent 16 minutes with the patient with telehealth technology discussing the above problems.     Medication Adjustments/Labs and Tests Ordered: Current medicines are reviewed at length with the patient today.  Concerns regarding medicines are outlined above.   Tests Ordered: No orders of the defined types were placed in this encounter.   Medication Changes: No orders of the defined types were placed in this encounter.   Disposition:  Follow up in 2 week(s)  Signed, Garwin Brothersajan R , MD  02/03/2019 11:40 AM    [facility]

## 2019-02-03 NOTE — Progress Notes (Signed)
Per private Epic chat from Dr. Tomie China, it was brought to his attention that patient has a contraindication with amlodipine. Dr. Tomie China would like patient to increase atenolol to 75 mg twice daily.

## 2019-02-04 ENCOUNTER — Telehealth: Payer: Self-pay

## 2019-02-04 LAB — CBC
Hematocrit: 38.6 % (ref 34.0–46.6)
Hemoglobin: 12.8 g/dL (ref 11.1–15.9)
MCH: 30 pg (ref 26.6–33.0)
MCHC: 33.2 g/dL (ref 31.5–35.7)
MCV: 91 fL (ref 79–97)
Platelets: 272 10*3/uL (ref 150–450)
RBC: 4.26 x10E6/uL (ref 3.77–5.28)
RDW: 11.8 % (ref 11.7–15.4)
WBC: 7.8 10*3/uL (ref 3.4–10.8)

## 2019-02-04 LAB — HEPATIC FUNCTION PANEL
ALT: 19 IU/L (ref 0–32)
AST: 19 IU/L (ref 0–40)
Albumin: 4.2 g/dL (ref 3.7–4.7)
Alkaline Phosphatase: 88 IU/L (ref 39–117)
Bilirubin Total: 0.3 mg/dL (ref 0.0–1.2)
Bilirubin, Direct: 0.11 mg/dL (ref 0.00–0.40)
Total Protein: 6.4 g/dL (ref 6.0–8.5)

## 2019-02-04 NOTE — Telephone Encounter (Signed)
-----   Message from Garwin Brothers, MD sent at 02/04/2019  8:11 AM EDT ----- The results of the study is unremarkable. Please inform patient. I will discuss in detail at next appointment. Cc  primary care/referring physician Garwin Brothers, MD 02/04/2019 8:11 AM

## 2019-02-04 NOTE — Telephone Encounter (Signed)
Called patient to verify if she was able to see results on mychart, she has no further questions. Copy sent to Dr. Suzanna Obey per Dr. Janey Genta

## 2019-02-16 ENCOUNTER — Encounter: Payer: Self-pay | Admitting: Cardiology

## 2019-02-16 ENCOUNTER — Other Ambulatory Visit: Payer: Self-pay

## 2019-02-16 ENCOUNTER — Telehealth (INDEPENDENT_AMBULATORY_CARE_PROVIDER_SITE_OTHER): Payer: Medicare Other | Admitting: Cardiology

## 2019-02-16 ENCOUNTER — Telehealth: Payer: Self-pay | Admitting: *Deleted

## 2019-02-16 VITALS — BP 180/109 | HR 75 | Ht 64.0 in | Wt 224.0 lb

## 2019-02-16 DIAGNOSIS — I4729 Other ventricular tachycardia: Secondary | ICD-10-CM

## 2019-02-16 DIAGNOSIS — G4733 Obstructive sleep apnea (adult) (pediatric): Secondary | ICD-10-CM

## 2019-02-16 DIAGNOSIS — Z8673 Personal history of transient ischemic attack (TIA), and cerebral infarction without residual deficits: Secondary | ICD-10-CM

## 2019-02-16 DIAGNOSIS — I480 Paroxysmal atrial fibrillation: Secondary | ICD-10-CM | POA: Diagnosis not present

## 2019-02-16 DIAGNOSIS — Z9989 Dependence on other enabling machines and devices: Secondary | ICD-10-CM

## 2019-02-16 DIAGNOSIS — E1141 Type 2 diabetes mellitus with diabetic mononeuropathy: Secondary | ICD-10-CM

## 2019-02-16 DIAGNOSIS — I10 Essential (primary) hypertension: Secondary | ICD-10-CM

## 2019-02-16 DIAGNOSIS — E785 Hyperlipidemia, unspecified: Secondary | ICD-10-CM

## 2019-02-16 DIAGNOSIS — I472 Ventricular tachycardia: Secondary | ICD-10-CM

## 2019-02-16 MED ORDER — CHLORTHALIDONE 50 MG PO TABS: 25.0000 mg | ORAL_TABLET | Freq: Every day | ORAL | 3 refills | Status: DC

## 2019-02-16 NOTE — Telephone Encounter (Signed)
The chart mentions that she is taking amlodipine 5 mg daily, let us increase it to 10 mg daily and keep a track of blood pressures.  Let us know by Friday how blood pressures are holding on.

## 2019-02-16 NOTE — Progress Notes (Signed)
Virtual Visit via Video Note   This visit type was conducted due to national recommendations for restrictions regarding the COVID-19 Pandemic (e.g. social distancing) in an effort to limit this patient's exposure and mitigate transmission in our community.  Due to her co-morbid illnesses, this patient is at least at moderate risk for complications without adequate follow up.  This format is felt to be most appropriate for this patient at this time.  All issues noted in this document were discussed and addressed.  A limited physical exam was performed with this format.  Please refer to the patient's chart for her consent to telehealth for [facility].   Date:  02/16/2019   ID:  Brooke Pitts, DOB 06/24/1946, MRN [id number]  Patient Location: Home Provider Location: Home  PCP:  Loyal Jacobson, MD  Cardiologist:  No primary care provider on file.  Electrophysiologist:  Will Jorja Loa, MD   Evaluation Performed:  Follow-Up Visit  Chief Complaint:  Uncontrolled HTN  History of Present Illness:    Brooke Pitts is a 73 y.o. female with past medical history of atrial fibrillation and essential hypertension.  She wanted to be evaluated today because of elevated blood pressure.  She denies any chest pain orthopnea or PND.  At the time of my evaluation, the patient is alert awake oriented and in no distress.  The patient does not have symptoms concerning for COVID-19 infection (fever, chills, cough, or new shortness of breath).    Past Medical History:  Diagnosis Date  . A-fib (HCC)   . Diabetes mellitus without complication (HCC)   . Hypertension   . Stroke [facility])    Past Surgical History:  Procedure Laterality Date  . ABDOMINAL HYSTERECTOMY    . CHOLECYSTECTOMY    . PACEMAKER INSERTION    . TONSILLECTOMY       Current Meds  Medication Sig  . amiodarone (PACERONE) 200 MG tablet Please titrate dose as noted: start 400 mg (2 tablets) daily for 2 weeks then 200 mg (1  tablet) daily for 2 weeks and then subsequently 100 mg (0.5 tablets) daily (Patient taking differently: Take 400 mg by mouth daily. Please titrate dose as noted: start 400 mg (2 tablets) daily for 2 weeks then 200 mg (1 tablet) daily for 2 weeks and then subsequently 100 mg (0.5 tablets) daily)  . atenolol (TENORMIN) 50 MG tablet Take 1.5 tablets (75 mg total) by mouth 2 (two) times a day.  . b complex vitamins tablet Take 1 tablet by mouth daily.  Marland Kitchen ELIQUIS 5 MG TABS tablet Take 1 tablet by mouth twice daily  . hydrochlorothiazide (HYDRODIURIL) 25 MG tablet Take 25 mg by mouth daily.  . irbesartan (AVAPRO) 300 MG tablet Take 300 mg by mouth daily.  . magnesium oxide (MAG-OX) 400 MG tablet Take 400 mg by mouth daily.  . metFORMIN (GLUCOPHAGE) 500 MG tablet Take by mouth 2 (two) times daily with a meal.  . Omega-3 Fatty Acids (FISH OIL) 1200 MG CPDR Take by mouth.  Brooke Pitts LANCETS 33G MISC   . ONETOUCH VERIO test strip   . UNKNOWN TO PATIENT Another BP med     Allergies:   Cymbalta [duloxetine hcl]; Hydrocodone; Lisinopril; Amlodipine; Duloxetine; Losartan; and Nsaids   Social History   Tobacco Use  . Smoking status: Never Smoker  . Smokeless tobacco: Never Used  Substance Use Topics  . Alcohol use: No  . Drug use: No     Family Hx: The patient's  family history includes Arrhythmia in her mother; Atrial fibrillation in her mother; Heart disease in her brother; Heart failure in her brother.  ROS:   Please see the history of present illness.    As mentioned above All other systems reviewed and are negative.   Prior CV studies:   The following studies were reviewed today:  None  Labs/Other Tests and Data Reviewed:    EKG:  No ECG reviewed.  Recent Labs: 01/28/2019: BUN 13; Creatinine, Ser 0.78; Potassium 4.0; Sodium 137 02/03/2019: ALT 19; Hemoglobin 12.8; Platelets 272   Recent Lipid Panel Lab Results  Component Value Date/Time   CHOL 165 01/28/2019 09:11 AM    TRIG 133 01/28/2019 09:11 AM   HDL 53 01/28/2019 09:11 AM   CHOLHDL 3.1 01/28/2019 09:11 AM   LDLCALC 85 01/28/2019 09:11 AM    Wt Readings from Last 3 Encounters:  02/16/19 224 lb (101.6 kg)  02/03/19 224 lb (101.6 kg)  01/27/19 222 lb (100.7 kg)     Objective:    Vital Signs:  BP (!) 180/109 (BP Location: Left Arm, Patient Position: Sitting, Cuff Size: Normal)   Pulse 75   Ht 5\' 4"  (1.626 m)   Wt 224 lb (101.6 kg)   BMI 38.45 kg/m    VITAL SIGNS:  reviewed  ASSESSMENT & PLAN:    1. Uncontrolled hypertension: I made the following recommendations to the patient.  I advised her again to be compliant with diet and she agrees.  We will stop her hydrochlorothiazide.  We will initiate chlorthalidone 50 mg she will begin with half tablet daily.  I told her that after 2 to 3 days if her blood pressure systolic is greater than 150 she can increase medicine to 1 tablet daily.  This will be 50 mg daily at that point.  She has an appointment coming up with me in the next few days.  Before this 1 day before the appointment she will have a Chem-7.  She knows to go to the nearest emergency room for any concerning symptoms and she will call us if she has any questions.  COVID-19 Education: The signs and symptoms of COVID-19 were discussed with the patient and how to seek care for testing (follow up with PCP or arrange E-visit).  The importance of social distancing was discussed today.  Time:   Today, I have spent 17 minutes with the patient with telehealth technology discussing the above problems.     Medication Adjustments/Labs and Tests Ordered: Current medicines are reviewed at length with the patient today.  Concerns regarding medicines are outlined above.   Tests Ordered: No orders of the defined types were placed in this encounter.   Medication Changes: No orders of the defined types were placed in this encounter.   Disposition:  Follow up She already has appointment.  Signed,  Garwin Brothersajan R Keiva Dina, MD  02/16/2019 2:16 PM    [facility]

## 2019-02-16 NOTE — Telephone Encounter (Signed)
Patient states she took he BP meds this morning at 0800 and just took BP at 0910 and it was 180/109, HR in 70's. She has a dull headache this morning, no dizziness, no chest pain, no SOB. She has been having edema in feet since Sunday. She took extra dose of HCTZ on 02/15/19. She states the lowest systolic BP since Saturday is 283'M. She is concerned because her systolic is consistently increasing. Information relayed to Dr. Janey Genta to advise?

## 2019-02-16 NOTE — Addendum Note (Signed)
Addended by: Pamala Hurry on: 02/16/2019 04:00 PM   Modules accepted: Orders

## 2019-02-16 NOTE — Telephone Encounter (Signed)
Pt calling back to let Dr. Kem Parkinson nurse know blood pressure is running high today: 180/109, No dizziness or lightheadedness. Denies any Shortness of breath or chest pains. Has a slight headache. Please advise. Sent to [employer] on phone

## 2019-02-16 NOTE — Patient Instructions (Signed)
Medication Instructions:  START taking chlorthalidone 25 mg (0.5 tablets) for 3 days, then increase to 50 mg (1 tablet) for systolic (top number) greater than 150.  STOP taking HCTZ.  If you need a refill on your cardiac medications before your next appointment, please call your pharmacy.   Lab work: You will come into office this week to have a BMP drawn.   If you have labs (blood work) drawn today and your tests are completely normal, you will receive your results only by: Marland Kitchen. MyChart Message (if you have MyChart) OR . A paper copy in the mail If you have any lab test that is abnormal or we need to change your treatment, we will call you to review the results.  Testing/Procedures: NONE  Follow-Up: At [facility], you and your health needs are our priority.  As part of our continuing mission to provide you with exceptional heart care, we have created designated Provider Care Teams.  These Care Teams include your primary Cardiologist (physician) and Advanced Practice Providers (APPs -  Physician Assistants and Nurse Practitioners) who all work together to provide you with the care you need, when you need it. You will need a follow up appointment in 1 weeks.   Any Other Special Instructions Will Be Listed Below Chlorthalidone tablets What is this medicine? CHLORTHALIDONE (klor THAL i done) is a diuretic. It increases the amount of urine passed, which causes the body to lose salt and water. This medicine is used to treat high blood pressure and edema or water retention. This medicine may be used for other purposes; ask your health care provider or pharmacist if you have questions. COMMON BRAND NAME(S): Thalitone What should I tell my health care provider before I take this medicine? They need to know if you have any of these conditions: -asthma -diabetes -gout -kidney disease -liver disease -parathyroid disease -systemic lupus erythematosus (SLE) -taking cortisone, digoxin, lithium  carbonate, or drugs for diabetes -an unusual or allergic reaction to chlorthalidone, sulfa drugs, other medicines, foods, dyes, or preservatives -pregnant or trying to get pregnant -breast-feeding How should I use this medicine? Take this medicine by mouth with a glass of water. Follow the directions on the prescription label. It is best to take your dose in the morning with food. Take your medicine at regular intervals. Do not take your medicine more often than directed. Do not stop taking except on your doctor's advice. Talk to your pediatrician regarding the use of this medicine in children. Special care may be needed. Overdosage: If you think you have taken too much of this medicine contact a poison control center or emergency room at once. NOTE: This medicine is only for you. Do not share this medicine with others. What if I miss a dose? If you miss a dose, take it as soon as you can. If it is almost time for your next dose, take only that dose. Do not take double or extra doses. What may interact with this medicine? -barbiturate medicines for sleep or seizure control -digoxin -lithium -medicines for diabetes -norepinephrine -other medicines for high blood pressure -some pain medicines -steroid hormones like prednisone, cortisone, hydrocortisone, corticotropin -tubocurarine This list may not describe all possible interactions. Give your health care provider a list of all the medicines, herbs, non-prescription drugs, or dietary supplements you use. Also tell them if you smoke, drink alcohol, or use illegal drugs. Some items may interact with your medicine. What should I watch for while using this medicine? Visit your  doctor or health care professional for regular check ups. Check your blood pressure as directed. Ask your doctor or health care professional what your blood pressure should be and when you should contact him or her. You may need to be on a special diet while taking this  medicine. Ask your doctor. You may get drowsy or dizzy. Do not drive, use machinery, or do anything that needs mental alertness until you know how this medicine affects you. Do not stand or sit up quickly, especially if you are an older patient. This reduces the risk of dizzy or fainting spells. Alcohol may interfere with the effect of this medicine. Avoid alcoholic drinks. This medicine may affect your blood sugar level. If you have diabetes, check with your doctor or health care professional before changing the dose of your diabetic medicine. This medicine can make you more sensitive to the sun. Keep out of the sun. If you cannot avoid being in the sun, wear protective clothing and use sunscreen. Do not use sun lamps or tanning beds/booths. What side effects may I notice from receiving this medicine? Side effects that you should report to your doctor or health care professional as soon as possible: -allergic reactions like skin rash, itching or hives, swelling of the face, lips, or tongue -dark urine -dry mouth -excess thirst -fast, irregular heart rate -fever, chills -muscle pain, cramps, or spasm -nausea, vomiting -redness, blistering, peeling or loosening of the skin, including inside the mouth -tingling, pain or numbness in the hands or feet -unusually weak or tired -yellowing of the eyes or skin Side effects that usually do not require medical attention (report to your doctor or health care professional if they continue or are bothersome): -diarrhea or constipation -headache -impotence -loss of appetite -stomach upset This list may not describe all possible side effects. Call your doctor for medical advice about side effects. You may report side effects to FDA at 1-800-FDA-1088. Where should I keep my medicine? Keep out of the reach of children. Store at room temperature between 15 and 30 degrees C (59 and 86 degrees F). Keep container tightly closed. Throw away any unused medicine  after the expiration date. NOTE: This sheet is a summary. It may not cover all possible information. If you have questions about this medicine, talk to your doctor, pharmacist, or health care provider.  2019 Elsevier/Gold Standard (2008-01-04 15:28:48)

## 2019-02-18 ENCOUNTER — Telehealth: Payer: Self-pay | Admitting: *Deleted

## 2019-02-18 MED ORDER — CHLORTHALIDONE 50 MG PO TABS: ORAL_TABLET | ORAL | 3 refills | Status: DC

## 2019-02-18 MED ORDER — CHLORTHALIDONE 50 MG PO TABS: 25.0000 mg | ORAL_TABLET | Freq: Every day | ORAL | 3 refills | Status: DC

## 2019-02-18 NOTE — Addendum Note (Signed)
Addended by: Roosvelt Harps R on: 02/18/2019 11:58 AM   Modules accepted: Orders

## 2019-02-18 NOTE — Telephone Encounter (Signed)
Ended up calling in the Chlorthalidone to pharmacy because it would not go electronically, tried shortening the sig but never could get it to work, kept printing.

## 2019-02-18 NOTE — Telephone Encounter (Signed)
Rx printed will go back and try again.

## 2019-02-18 NOTE — Telephone Encounter (Signed)
Pt calling to say the pharmacy has not gotten Rx for Chlorthalidone yet. I will resend it. Pt is good with that.

## 2019-02-22 ENCOUNTER — Other Ambulatory Visit: Payer: Self-pay

## 2019-02-22 ENCOUNTER — Telehealth: Payer: Self-pay

## 2019-02-22 ENCOUNTER — Encounter: Payer: Self-pay | Admitting: Cardiology

## 2019-02-22 ENCOUNTER — Telehealth (INDEPENDENT_AMBULATORY_CARE_PROVIDER_SITE_OTHER): Payer: Medicare Other | Admitting: Cardiology

## 2019-02-22 VITALS — BP 152/92 | HR 88 | Ht 64.0 in | Wt 224.0 lb

## 2019-02-22 DIAGNOSIS — G4733 Obstructive sleep apnea (adult) (pediatric): Secondary | ICD-10-CM

## 2019-02-22 DIAGNOSIS — I472 Ventricular tachycardia, unspecified: Secondary | ICD-10-CM

## 2019-02-22 DIAGNOSIS — I480 Paroxysmal atrial fibrillation: Secondary | ICD-10-CM

## 2019-02-22 DIAGNOSIS — I4729 Other ventricular tachycardia: Secondary | ICD-10-CM

## 2019-02-22 DIAGNOSIS — I10 Essential (primary) hypertension: Secondary | ICD-10-CM

## 2019-02-22 DIAGNOSIS — Z950 Presence of cardiac pacemaker: Secondary | ICD-10-CM

## 2019-02-22 DIAGNOSIS — E669 Obesity, unspecified: Secondary | ICD-10-CM

## 2019-02-22 NOTE — Progress Notes (Signed)
Virtual Visit via Video Note   This visit type was conducted due to national recommendations for restrictions regarding the COVID-19 Pandemic (e.g. social distancing) in an effort to limit this patient's exposure and mitigate transmission in our community.  Due to her co-morbid illnesses, this patient is at least at moderate risk for complications without adequate follow up.  This format is felt to be most appropriate for this patient at this time.  All issues noted in this document were discussed and addressed.  A limited physical exam was performed with this format.  Please refer to the patient's chart for her consent to telehealth for [facility].   Date:  02/22/2019   ID:  Brooke AspBarbara A Pinn, DOB 01-10-1946, MRN [id number]  Patient Location: Home Provider Location: Home  PCP:  Loyal JacobsonKalish, Michael, MD  Cardiologist:  No primary care provider on file.  Electrophysiologist:  Will Jorja LoaMartin Camnitz, MD   Evaluation Performed:  Follow-Up Visit  Chief Complaint: Essential hypertension  History of Present Illness:    Brooke Pitts is a 73 y.o. female with past medical history of essential hypertension and diabetes mellitus.  She mentions to me that ever since she began chlorthalidone her blood pressure is much better.  Today she started taking 50 mg a day.  And she expects her blood pressure to be even better.  She is happy about it.  She has had atrial fibrillation and is on anticoagulation.  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  She is now been on amiodarone therapy.  She has no symptoms.  At the time of my evaluation, the patient is alert awake oriented and in no distress.  The patient does not have symptoms concerning for COVID-19 infection (fever, chills, cough, or new shortness of breath).    Past Medical History:  Diagnosis Date  . A-fib (HCC)   . Diabetes mellitus without complication (HCC)   . Hypertension   . Stroke [facility](HCC)    Past  Surgical History:  Procedure Laterality Date  . ABDOMINAL HYSTERECTOMY    . CHOLECYSTECTOMY    . PACEMAKER INSERTION    . TONSILLECTOMY       Current Meds  Medication Sig  . amiodarone (PACERONE) 200 MG tablet Please titrate dose as noted: start 400 mg (2 tablets) daily for 2 weeks then 200 mg (1 tablet) daily for 2 weeks and then subsequently 100 mg (0.5 tablets) daily (Patient taking differently: Take 200 mg by mouth daily. Please titrate dose as noted: start 400 mg (2 tablets) daily for 2 weeks then 200 mg (1 tablet) daily for 2 weeks and then subsequently 100 mg (0.5 tablets) daily)  . atenolol (TENORMIN) 50 MG tablet Take 1.5 tablets (75 mg total) by mouth 2 (two) times a day.  . b complex vitamins tablet Take 1 tablet by mouth daily.  . chlorthalidone (HYGROTON) 50 MG tablet Take 1/2 tablet 2-3 days, then increase to 1 tab (50 mg) as needed for systolic BP greater than 150. (Patient taking differently: Take 50 mg by mouth daily. Take 1/2 tablet 2-3 days, then increase to 1 tab (50 mg) as needed for systolic BP greater than 150.)  . ELIQUIS 5 MG TABS tablet Take 1 tablet by mouth twice daily  . irbesartan (AVAPRO) 300 MG tablet Take 300 mg by mouth daily.  . magnesium oxide (MAG-OX) 400 MG tablet Take 400 mg by mouth daily.  . metFORMIN (GLUCOPHAGE) 500 MG tablet Take by mouth 2 (two) times daily  with a meal.  . Omega-3 Fatty Acids (FISH OIL) 1200 MG CPDR Take 1 capsule by mouth daily.   [name] DELICA LANCETS 33G MISC   . ONETOUCH VERIO test strip   . UNKNOWN TO PATIENT Another BP med     Allergies:   Cymbalta [duloxetine hcl]; Hydrocodone; Lisinopril; Amlodipine; Duloxetine; Losartan; and Nsaids   Social History   Tobacco Use  . Smoking status: Never Smoker  . Smokeless tobacco: Never Used  Substance Use Topics  . Alcohol use: No  . Drug use: No     Family Hx: The patient's family history includes Arrhythmia in her mother; Atrial fibrillation in her mother; Heart  disease in her brother; Heart failure in her brother.  ROS:   Please see the history of present illness.    As mentioned above All other systems reviewed and are negative.   Prior CV studies:   The following studies were reviewed today:  I discussed my findings with the patient at length.  Labs/Other Tests and Data Reviewed:    EKG:  No ECG reviewed.  Recent Labs: 01/28/2019: BUN 13; Creatinine, Ser 0.78; Potassium 4.0; Sodium 137 02/03/2019: ALT 19; Hemoglobin 12.8; Platelets 272   Recent Lipid Panel Lab Results  Component Value Date/Time   CHOL 165 01/28/2019 09:11 AM   TRIG 133 01/28/2019 09:11 AM   HDL 53 01/28/2019 09:11 AM   CHOLHDL 3.1 01/28/2019 09:11 AM   LDLCALC 85 01/28/2019 09:11 AM    Wt Readings from Last 3 Encounters:  02/22/19 224 lb (101.6 kg)  02/16/19 224 lb (101.6 kg)  02/03/19 224 lb (101.6 kg)     Objective:    Vital Signs:  BP (!) 152/92 (BP Location: Left Arm, Patient Position: Sitting, Cuff Size: Normal)   Pulse 88   Ht 5\' 4"  (1.626 m)   Wt 224 lb (101.6 kg)   BMI 38.45 kg/m    VITAL SIGNS:  reviewed  ASSESSMENT & PLAN:    1. Primary prevention stressed with the patient.  Importance of compliance with diet and medication stressed and she vocalized understanding. 2. Essential hypertension: Her blood pressure is stable.  She had a Chem-7 this morning and we will await records of lab work.  Lifestyle modification was explained. 3. In view of the fact that she had nonsustained ventricular tachycardia and atrial fibrillation we will now request her pacemaker colleagues to reevaluate her pacemaker and this can be done by remote.  I would like to know how her arrhythmias have responded to amiodarone therapy.  She continues to take 200 mg of amiodarone daily. 4. In view of the above arrhythmias she will have a Lexiscan sestamibi done in the next week or 2. 5. Follow-up appointment in a week or earlier if she has any concerns.  COVID-19 Education:  The signs and symptoms of COVID-19 were discussed with the patient and how to seek care for testing (follow up with PCP or arrange E-visit).  The importance of social distancing was discussed today.  Time:   Today, I have spent 15 minutes with the patient with telehealth technology discussing the above problems.     Medication Adjustments/Labs and Tests Ordered: Current medicines are reviewed at length with the patient today.  Concerns regarding medicines are outlined above.   Tests Ordered: No orders of the defined types were placed in this encounter.   Medication Changes: No orders of the defined types were placed in this encounter.   Disposition:  Follow up in 1  week(s)  Signed, Garwin Brothers, MD  02/22/2019 11:56 AM    [facility]

## 2019-02-22 NOTE — Telephone Encounter (Signed)
Please inform patient that arrhythmias are much better

## 2019-02-22 NOTE — Addendum Note (Signed)
Addended by: Pamala Hurry on: 02/22/2019 01:27 PM   Modules accepted: Orders

## 2019-02-22 NOTE — Patient Instructions (Addendum)
Medication Instructions:  Your physician recommends that you continue on your current medications as directed. Please refer to the Current Medication list given to you today.  If you need a refill on your cardiac medications before your next appointment, please call your pharmacy.   Lab work: NONE If you have labs (blood work) drawn today and your tests are completely normal, you will receive your results only by: Marland Kitchen MyChart Message (if you have MyChart) OR . A paper copy in the mail If you have any lab test that is abnormal or we need to change your treatment, we will call you to review the results.  Testing/Procedures: Your physician has requested that you have a lexiscan myoview. For further information please visit [email or web address] Please follow instruction sheet, as given.    [facility] at [facility] [address] [place], [place] [address] Phone: [phone]  Feb 22, 2019    Brooke Pitts DOB: [birth year] MRN: [id number] [address] [place] [address]   Dear [name],  You will be called to schedule a Myocardial Perfusion Imaging Study. Please arrive 15 minutes prior to your appointment time for registration and insurance purposes.  The test will take approximately 3 to 4 hours to complete; you may bring reading material.  If someone comes with you to your appointment, they will need to remain in the main lobby due to limited space in the testing area. **If you are pregnant or breastfeeding, please notify the nuclear lab prior to your appointment**  How to prepare for your Myocardial Perfusion Test: . Do not eat or drink 3 hours prior to your test, except you may have water. . Do not consume products containing caffeine (regular or decaffeinated) 12 hours prior to your test. (ex: coffee, chocolate, sodas, tea). Do bring a list of your current medications with you.  If not listed below, you may take your  medications as normal. . Do not take atenolol (Tenormin) for 24 hours prior to the test. . Do not take your chlorthialidone or metformin the morning of your procedure. Bring the medication to your appointment as you may be required to take it once the test is complete. . Do wear comfortable clothes (no dresses or overalls) and walking shoes, tennis shoes preferred (No heels or open toe shoes are allowed). . Do NOT wear cologne, perfume, aftershave, or lotions (deodorant is allowed). . If these instructions are not followed, your test will have to be rescheduled.  Please report to [address] [facility], [address] for your test.  If you have questions or concerns about your appointment, you can call the Nuclear Lab at Phone: [phone].  If you cannot keep your appointment, please provide 24 hours notification to the Nuclear Lab, to avoid a possible $50 charge to your account.   Follow-Up: At [facility], you and your health needs are our priority.  As part of our continuing mission to provide you with exceptional heart care, we have created designated Provider Care Teams.  These Care Teams include your primary Cardiologist (physician) and Advanced Practice Providers (APPs -  Physician Assistants and Nurse Practitioners) who all work together to provide you with the care you need, when you need it. You will need a follow up appointment in 1 weeks.   Any Other Special Instructions Will Be Listed Below  Regadenoson injection What is this medicine? REGADENOSON is used to test the heart for coronary artery disease. It is used in patients who can  not exercise for their stress test. This medicine may be used for other purposes; ask your health care provider or pharmacist if you have questions. COMMON BRAND NAME(S): Lexiscan What should I tell my health care provider before I take this medicine? They need to know if you have any of these conditions: -heart problems -lung or breathing disease,  like asthma or COPD -an unusual or allergic reaction to regadenoson, other medicines, foods, dyes, or preservatives -pregnant or trying to get pregnant -breast-feeding How should I use this medicine? This medicine is for injection into a vein. It is given by a health care professional in a hospital or clinic setting. Talk to your pediatrician regarding the use of this medicine in children. Special care may be needed. Overdosage: If you think you have taken too much of this medicine contact a poison control center or emergency room at once. NOTE: This medicine is only for you. Do not share this medicine with others. What if I miss a dose? This does not apply. What may interact with this medicine? -caffeine -dipyridamole -guarana -theophylline This list may not describe all possible interactions. Give your health care provider a list of all the medicines, herbs, non-prescription drugs, or dietary supplements you use. Also tell them if you smoke, drink alcohol, or use illegal drugs. Some items may interact with your medicine. What should I watch for while using this medicine? Your condition will be monitored carefully while you are receiving this medicine. Do not take medicines, foods, or drinks with caffeine (like coffee, tea, or colas) for at least 12 hours before your test. If you do not know if something contains caffeine, ask your health care professional. What side effects may I notice from receiving this medicine? Side effects that you should report to your doctor or health care professional as soon as possible: -allergic reactions like skin rash, itching or hives, swelling of the face, lips, or tongue -breathing problems -chest pain, tightness or palpitations -severe headache Side effects that usually do not require medical attention (report to your doctor or health care professional if they continue or are bothersome): -flushing -headache -irritation or pain at site where injected  -nausea, vomiting This list may not describe all possible side effects. Call your doctor for medical advice about side effects. You may report side effects to FDA at 1-800-FDA-1088. Where should I keep my medicine? This drug is given in a hospital or clinic and will not be stored at home. NOTE: This sheet is a summary. It may not cover all possible information. If you have questions about this medicine, talk to your doctor, pharmacist, or health care provider.  2019 Elsevier/Gold Standard (2008-05-29 15:08:13)   Cardiac Nuclear Scan A cardiac nuclear scan is a test that is done to check the flow of blood to your heart. It is done when you are resting and when you are exercising. The test looks for problems such as:  Not enough blood reaching a portion of the heart.  The heart muscle not working as it should. You may need this test if:  You have heart disease.  You have had lab results that are not normal.  You have had heart surgery or a balloon procedure to open up blocked arteries (angioplasty).  You have chest pain.  You have shortness of breath. In this test, a special dye (tracer) is put into your bloodstream. The tracer will travel to your heart. A camera will then take pictures of your heart to see how the  tracer moves through your heart. This test is usually done at a hospital and takes 2-4 hours. Tell a doctor about:  Any allergies you have.  All medicines you are taking, including vitamins, herbs, eye drops, creams, and over-the-counter medicines.  Any problems you or family members have had with anesthetic medicines.  Any blood disorders you have.  Any surgeries you have had.  Any medical conditions you have.  Whether you are pregnant or may be pregnant. What are the risks? Generally, this is a safe test. However, problems may occur, such as:  Serious chest pain and heart attack. This is only a risk if the stress portion of the test is done.  Rapid  heartbeat.  A feeling of warmth in your chest. This feeling usually does not last long.  Allergic reaction to the tracer. What happens before the test?  Ask your doctor about changing or stopping your normal medicines. This is important.  Follow instructions from your doctor about what you cannot eat or drink.  Remove your jewelry on the day of the test. What happens during the test?  An IV tube will be inserted into one of your veins.  Your doctor will give you a small amount of tracer through the IV tube.  You will wait for 20-40 minutes while the tracer moves through your bloodstream.  Your heart will be monitored with an electrocardiogram (ECG).  You will lie down on an exam table.  Pictures of your heart will be taken for about 15-20 minutes.  You may also have a stress test. For this test, one of these things may be done: ? You will be asked to exercise on a treadmill or a stationary bike. ? You will be given medicines that will make your heart work harder. This is done if you are unable to exercise.  When blood flow to your heart has peaked, a tracer will again be given through the IV tube.  After 20-40 minutes, you will get back on the exam table. More pictures will be taken of your heart.  Depending on the tracer that is used, more pictures may need to be taken 3-4 hours later.  Your IV tube will be removed when the test is over. The test may vary among doctors and hospitals. What happens after the test?  Ask your doctor: ? Whether you can return to your normal schedule, including diet, activities, and medicines. ? Whether you should drink more fluids. This will help to remove the tracer from your body. Drink enough fluid to keep your pee (urine) pale yellow.  Ask your doctor, or the department that is doing the test: ? When will my results be ready? ? How will I get my results? Summary  A cardiac nuclear scan is a test that is done to check the flow of blood  to your heart.  Tell your doctor whether you are pregnant or may be pregnant.  Before the test, ask your doctor about changing or stopping your normal medicines. This is important.  Ask your doctor whether you can return to your normal activities. You may be asked to drink more fluids. This information is not intended to replace advice given to you by your health care provider. Make sure you discuss any questions you have with your health care provider. Document Released: 03/15/2018 Document Revised: 03/15/2018 Document Reviewed: 03/15/2018 Elsevier Interactive Patient Education  2019 [employer].

## 2019-02-22 NOTE — Telephone Encounter (Signed)
Spoke with the pt and she agreed to send a manual transmission with her home monitor.

## 2019-02-22 NOTE — Telephone Encounter (Signed)
Transmission requested by Dr. Tomie China received and reviewed. Normal device function. Lead trends stable. No ventricular arrhythmias noted. <0.1% AT/AF burden since last transmission on 01/17/19, no EGMs due to short duration. Presenting rhythm Ap/Vs @ 96bpm.

## 2019-02-22 NOTE — Telephone Encounter (Signed)
Information relayed to patient no further question

## 2019-02-23 ENCOUNTER — Telehealth: Payer: Self-pay

## 2019-02-23 LAB — BASIC METABOLIC PANEL
BUN/Creatinine Ratio: 10 — ABNORMAL LOW (ref 12–28)
BUN: 8 mg/dL (ref 8–27)
CO2: 26 mmol/L (ref 20–29)
Calcium: 8.9 mg/dL (ref 8.7–10.3)
Chloride: 101 mmol/L (ref 96–106)
Creatinine, Ser: 0.77 mg/dL (ref 0.57–1.00)
GFR calc Af Amer: 90 mL/min/{1.73_m2} (ref >59)
GFR calc non Af Amer: 78 mL/min/{1.73_m2} (ref >59)
Glucose: 175 mg/dL — ABNORMAL HIGH (ref 65–99)
Potassium: 4.2 mmol/L (ref 3.5–5.2)
Sodium: 139 mmol/L (ref 134–144)

## 2019-02-23 NOTE — Telephone Encounter (Signed)
-----   Message from Garwin Brothers, MD sent at 02/23/2019  8:08 AM EDT ----- The results of the study is unremarkable. Please inform patient. I will discuss in detail at next appointment. Cc  primary care/referring physician Garwin Brothers, MD 02/23/2019 8:08 AM

## 2019-02-23 NOTE — Telephone Encounter (Signed)
Informed patient of results, no further questions at this time. Copy of results sent to Dr. Leonette Most office per Dr.RRR request.

## 2019-02-24 ENCOUNTER — Telehealth (HOSPITAL_COMMUNITY): Payer: Self-pay

## 2019-02-24 NOTE — Telephone Encounter (Signed)
Encounter complete. 

## 2019-02-25 ENCOUNTER — Ambulatory Visit (HOSPITAL_COMMUNITY)
Admission: RE | Admit: 2019-02-25 | Discharge: 2019-02-25 | Disposition: A | Discharge status: Home / Self Care | Payer: Medicare Other | Source: Ambulatory Visit | Attending: Cardiology | Admitting: Cardiology

## 2019-02-25 ENCOUNTER — Other Ambulatory Visit: Payer: Self-pay

## 2019-02-25 DIAGNOSIS — Z950 Presence of cardiac pacemaker: Secondary | ICD-10-CM | POA: Insufficient documentation

## 2019-02-25 DIAGNOSIS — G4733 Obstructive sleep apnea (adult) (pediatric): Secondary | ICD-10-CM | POA: Diagnosis not present

## 2019-02-25 DIAGNOSIS — I472 Ventricular tachycardia: Secondary | ICD-10-CM | POA: Insufficient documentation

## 2019-02-25 DIAGNOSIS — I10 Essential (primary) hypertension: Secondary | ICD-10-CM | POA: Diagnosis not present

## 2019-02-25 DIAGNOSIS — Z6838 Body mass index (BMI) 38.0-38.9, adult: Secondary | ICD-10-CM | POA: Insufficient documentation

## 2019-02-25 DIAGNOSIS — I480 Paroxysmal atrial fibrillation: Secondary | ICD-10-CM | POA: Diagnosis not present

## 2019-02-25 DIAGNOSIS — E669 Obesity, unspecified: Secondary | ICD-10-CM | POA: Insufficient documentation

## 2019-02-25 LAB — MYOCARDIAL PERFUSION IMAGING
LV dias vol: 51 mL (ref 46–106)
LV sys vol: 17 mL
Peak HR: 80 {beats}/min
Rest HR: 69 {beats}/min
SDS: 1
SRS: 2
SSS: 3
TID: 1.06

## 2019-02-25 MED ORDER — REGADENOSON 0.4 MG/5ML IV SOLN
0.4000 mg | Freq: Once | INTRAVENOUS | Status: AC
  Administered 2019-02-25: 0.4 mg via INTRAVENOUS

## 2019-02-25 MED ORDER — TECHNETIUM TC 99M TETROFOSMIN IV KIT
24.6000 | PACK | Freq: Once | INTRAVENOUS | Status: AC | PRN
  Administered 2019-02-25: 24.6 via INTRAVENOUS
  Filled 2019-02-25: qty 25

## 2019-02-25 MED ORDER — TECHNETIUM TC 99M TETROFOSMIN IV KIT
8.6000 | PACK | Freq: Once | INTRAVENOUS | Status: AC | PRN
  Administered 2019-02-25: 8.6 via INTRAVENOUS
  Filled 2019-02-25: qty 9

## 2019-02-28 ENCOUNTER — Telehealth: Payer: Self-pay

## 2019-02-28 NOTE — Telephone Encounter (Signed)
Information relayed to pt with no further questions. Copy sent to [facility] office per Dr. Janey Genta.

## 2019-02-28 NOTE — Telephone Encounter (Signed)
-----   Message from Garwin Brothers, MD sent at 02/25/2019  3:39 PM EDT ----- The results of the study is unremarkable. Please inform patient. I will discuss in detail at next appointment. Cc  primary care/referring physician Garwin Brothers, MD 02/25/2019 3:39 PM

## 2019-03-04 ENCOUNTER — Other Ambulatory Visit: Payer: Self-pay

## 2019-03-04 ENCOUNTER — Encounter: Payer: Self-pay | Admitting: Cardiology

## 2019-03-04 ENCOUNTER — Telehealth (INDEPENDENT_AMBULATORY_CARE_PROVIDER_SITE_OTHER): Payer: Medicare Other | Admitting: Cardiology

## 2019-03-04 VITALS — BP 138/84 | HR 73 | Ht 64.0 in | Wt 224.0 lb

## 2019-03-04 DIAGNOSIS — Z950 Presence of cardiac pacemaker: Secondary | ICD-10-CM

## 2019-03-04 DIAGNOSIS — G4733 Obstructive sleep apnea (adult) (pediatric): Secondary | ICD-10-CM

## 2019-03-04 DIAGNOSIS — I480 Paroxysmal atrial fibrillation: Secondary | ICD-10-CM

## 2019-03-04 DIAGNOSIS — I4729 Other ventricular tachycardia: Secondary | ICD-10-CM

## 2019-03-04 DIAGNOSIS — I10 Essential (primary) hypertension: Secondary | ICD-10-CM | POA: Diagnosis not present

## 2019-03-04 DIAGNOSIS — I472 Ventricular tachycardia: Secondary | ICD-10-CM

## 2019-03-04 DIAGNOSIS — E088 Diabetes mellitus due to underlying condition with unspecified complications: Secondary | ICD-10-CM | POA: Insufficient documentation

## 2019-03-04 DIAGNOSIS — Z8673 Personal history of transient ischemic attack (TIA), and cerebral infarction without residual deficits: Secondary | ICD-10-CM

## 2019-03-04 DIAGNOSIS — Z7901 Long term (current) use of anticoagulants: Secondary | ICD-10-CM

## 2019-03-04 HISTORY — DX: Diabetes mellitus due to underlying condition with unspecified complications: E08.8

## 2019-03-04 NOTE — Patient Instructions (Signed)
Medication Instructions: Your physician recommends that you continue on your current medications as directed. Please refer to the Current Medication list given to you today.  If you need a refill on your cardiac medications before your next appointment, please call your pharmacy.   Lab work: NONE If you have labs (blood work) drawn today and your tests are completely normal, you will receive your results only by: . MyChart Message (if you have MyChart) OR . A paper copy in the mail If you have any lab test that is abnormal or we need to change your treatment, we will call you to review the results.  Testing/Procedures: NONE  Follow-Up: At [facility], you and your health needs are our priority.  As part of our continuing mission to provide you with exceptional heart care, we have created designated Provider Care Teams.  These Care Teams include your primary Cardiologist (physician) and Advanced Practice Providers (APPs -  Physician Assistants and Nurse Practitioners) who all work together to provide you with the care you need, when you need it. You will need a follow up appointment in 2 months.    

## 2019-03-04 NOTE — Progress Notes (Signed)
Virtual Visit via Video Note   This visit type was conducted due to national recommendations for restrictions regarding the COVID-19 Pandemic (e.g. social distancing) in an effort to limit this patient's exposure and mitigate transmission in our community.  Due to her co-morbid illnesses, this patient is at least at moderate risk for complications without adequate follow up.  This format is felt to be most appropriate for this patient at this time.  All issues noted in this document were discussed and addressed.  A limited physical exam was performed with this format.  Please refer to the patient's chart for her consent to telehealth for [facility].   Date:  03/04/2019   ID:  Brooke, Pitts 08/31/1946, MRN [id number]  Patient Location: Home Provider Location: Home  PCP:  Loyal Jacobson, MD  Cardiologist:  No primary care provider on file.  Electrophysiologist:  Will Jorja Loa, MD   Evaluation Performed:  Follow-Up Visit  Chief Complaint:  PAF   History of Present Illness:    Brooke Pitts is a 73 y.o. female with past medical history of essential hypertension, diabetes mellitus, obstructive sleep apnea and paroxysmal atrial fibrillation and nonsustained ventricular tachycardia.  She denies any problems at this time and takes care of activities of daily living.  No chest pain orthopnea or PND.  The patient mentions to me that she exercises on a regular basis and very happy.  She has no palpitations.  She is doing her best to lose weight.  At the time of my evaluation, the patient is alert awake oriented and in no distress.  The patient does not have symptoms concerning for COVID-19 infection (fever, chills, cough, or new shortness of breath).    Past Medical History:  Diagnosis Date  . A-fib (HCC)   . Diabetes mellitus without complication (HCC)   . Hypertension   . Stroke [facility])    Past Surgical History:  Procedure Laterality Date  . ABDOMINAL HYSTERECTOMY     . CHOLECYSTECTOMY    . PACEMAKER INSERTION    . TONSILLECTOMY       Current Meds  Medication Sig  . amiodarone (PACERONE) 200 MG tablet Please titrate dose as noted: start 400 mg (2 tablets) daily for 2 weeks then 200 mg (1 tablet) daily for 2 weeks and then subsequently 100 mg (0.5 tablets) daily (Patient taking differently: Take 200 mg by mouth daily. Please titrate dose as noted: start 400 mg (2 tablets) daily for 2 weeks then 200 mg (1 tablet) daily for 2 weeks and then subsequently 100 mg (0.5 tablets) daily)  . atenolol (TENORMIN) 50 MG tablet Take 1.5 tablets (75 mg total) by mouth 2 (two) times a day.  . b complex vitamins tablet Take 1 tablet by mouth daily.  . chlorthalidone (HYGROTON) 50 MG tablet Take 1/2 tablet 2-3 days, then increase to 1 tab (50 mg) as needed for systolic BP greater than 150. (Patient taking differently: Take 50 mg by mouth daily. Take 1/2 tablet 2-3 days, then increase to 1 tab (50 mg) as needed for systolic BP greater than 150.)  . ELIQUIS 5 MG TABS tablet Take 1 tablet by mouth twice daily  . irbesartan (AVAPRO) 300 MG tablet Take 300 mg by mouth daily.  . magnesium oxide (MAG-OX) 400 MG tablet Take 400 mg by mouth daily.  . metFORMIN (GLUCOPHAGE) 500 MG tablet Take by mouth 2 (two) times daily with a meal.  . Omega-3 Fatty Acids (FISH OIL) 1200 MG CPDR  Take 1 capsule by mouth daily.   [name] DELICA LANCETS 33G MISC   . ONETOUCH VERIO test strip   . UNKNOWN TO PATIENT Another BP med     Allergies:   Cymbalta [duloxetine hcl]; Hydrocodone; Lisinopril; Amlodipine; Duloxetine; Losartan; and Nsaids   Social History   Tobacco Use  . Smoking status: Never Smoker  . Smokeless tobacco: Never Used  Substance Use Topics  . Alcohol use: No  . Drug use: No     Family Hx: The patient's family history includes Arrhythmia in her mother; Atrial fibrillation in her mother; Heart disease in her brother; Heart failure in her brother.  ROS:   Please see the  history of present illness.    As above All other systems reviewed and are negative.   Prior CV studies:   The following studies were reviewed today:  Results of the blood work and stress test were detailed with the patient at extensive length  Labs/Other Tests and Data Reviewed:    EKG:  Telemetry strips reports were reviewed from the pacemaker evaluations  Recent Labs: 02/03/2019: ALT 19; Hemoglobin 12.8; Platelets 272 02/22/2019: BUN 8; Creatinine, Ser 0.77; Potassium 4.2; Sodium 139   Recent Lipid Panel Lab Results  Component Value Date/Time   CHOL 165 01/28/2019 09:11 AM   TRIG 133 01/28/2019 09:11 AM   HDL 53 01/28/2019 09:11 AM   CHOLHDL 3.1 01/28/2019 09:11 AM   LDLCALC 85 01/28/2019 09:11 AM    Wt Readings from Last 3 Encounters:  03/04/19 224 lb (101.6 kg)  02/25/19 224 lb (101.6 kg)  02/22/19 224 lb (101.6 kg)     Objective:    Vital Signs:  BP 138/84 (BP Location: Left Arm, Patient Position: Sitting, Cuff Size: Normal)   Pulse 73   Ht 5\' 4"  (1.626 m)   Wt 224 lb (101.6 kg)   BMI 38.45 kg/m    VITAL SIGNS:  reviewed  ASSESSMENT & PLAN:    1. Paroxysmal atrial fibrillation:I discussed with the patient atrial fibrillation, disease process. Management and therapy including rate and rhythm control, anticoagulation benefits and potential risks were discussed extensively with the patient. Patient had multiple questions which were answered to patient's satisfaction.  Clinically the patient is not experiencing any palpitations or any symptoms and her effort tolerance keeps getting better. 2. Nonsustained ventricular tachycardia: Stress test was unremarkable and the patient is on appropriate therapy and electrolytes are fine.  She has an appointment with her primary care physician on 6 June and she will have a Chem-7 and a magnesium level done at that time.  She will see me in follow-up appointment in 2 months or earlier if she has any concerns. 3. Essential  hypertension: Her blood pressure is stable 4. Mixed dyslipidemia: Diet was discussed 5. Morbid obesity: Risks of obesity explained and diet was expressed to her and she vocalized understanding and plans to do better.  COVID-19 Education: The signs and symptoms of COVID-19 were discussed with the patient and how to seek care for testing (follow up with PCP or arrange E-visit).  The importance of social distancing was discussed today.  Time:   Today, I have spent 15 minutes with the patient with telehealth technology discussing the above problems.     Medication Adjustments/Labs and Tests Ordered: Current medicines are reviewed at length with the patient today.  Concerns regarding medicines are outlined above.   Tests Ordered: No orders of the defined types were placed in this encounter.   Medication  Changes: No orders of the defined types were placed in this encounter.   Disposition:  Follow up in 2 month(s)  Signed, Garwin Brothersajan R Kebra Lowrimore, MD  03/04/2019 8:25 AM    [facility]

## 2019-04-18 ENCOUNTER — Ambulatory Visit (INDEPENDENT_AMBULATORY_CARE_PROVIDER_SITE_OTHER): Payer: Medicare Other | Admitting: *Deleted

## 2019-04-18 DIAGNOSIS — I472 Ventricular tachycardia, unspecified: Secondary | ICD-10-CM

## 2019-04-19 LAB — CUP PACEART REMOTE DEVICE CHECK
Battery Remaining Longevity: 56 mo
Battery Voltage: 2.99 V
Brady Statistic AP VP Percent: 0.06 %
Brady Statistic AP VS Percent: 98.96 %
Brady Statistic AS VP Percent: 0 %
Brady Statistic AS VS Percent: 0.98 %
Brady Statistic RA Percent Paced: 98.47 %
Brady Statistic RV Percent Paced: 0.07 %
Date Time Interrogation Session: 20200707150645
Implantable Lead Implant Date: 20150114
Implantable Lead Implant Date: 20150114
Implantable Lead Location: 753859
Implantable Lead Location: 753860
Implantable Lead Model: 5076
Implantable Lead Model: 5076
Implantable Pulse Generator Implant Date: 20150114
Lead Channel Impedance Value: 323 Ohm
Lead Channel Impedance Value: 418 Ohm
Lead Channel Impedance Value: 418 Ohm
Lead Channel Impedance Value: 551 Ohm
Lead Channel Pacing Threshold Amplitude: 0.75 V
Lead Channel Pacing Threshold Amplitude: 0.875 V
Lead Channel Pacing Threshold Pulse Width: 0.4 ms
Lead Channel Pacing Threshold Pulse Width: 0.4 ms
Lead Channel Sensing Intrinsic Amplitude: 1.875 mV
Lead Channel Sensing Intrinsic Amplitude: 1.875 mV
Lead Channel Sensing Intrinsic Amplitude: 17.125 mV
Lead Channel Sensing Intrinsic Amplitude: 17.125 mV
Lead Channel Setting Pacing Amplitude: 1.75 V
Lead Channel Setting Pacing Amplitude: 2 V
Lead Channel Setting Pacing Pulse Width: 0.4 ms
Lead Channel Setting Sensing Sensitivity: 0.9 mV

## 2019-04-23 ENCOUNTER — Other Ambulatory Visit: Payer: Self-pay | Admitting: Cardiology

## 2019-04-25 ENCOUNTER — Other Ambulatory Visit: Payer: Self-pay | Admitting: *Deleted

## 2019-04-25 ENCOUNTER — Telehealth: Payer: Self-pay | Admitting: *Deleted

## 2019-04-25 NOTE — Telephone Encounter (Signed)
Patient called back. States takes 100 mg amiodarone. Feels like this is causing severe insomnia. Unable to sleep but 2 night a a week this past week..She is wondering if it could be change d or discontinued before she orders another prescription. Pleaes advise

## 2019-04-25 NOTE — Telephone Encounter (Signed)
Telephone call to patient . Left message to return call regarding amiodarone.

## 2019-04-25 NOTE — Telephone Encounter (Signed)
Pt is having side effects from the Amiodarone. She has terrible insomnia and is not sleeping at all for 3 weeks. Was trying to hold off calling until appt next week but couldn't take it any more. Please advise.  Also needs refill  *STAT* If patient is at the pharmacy, call can be transferred to refill team.   1. Which medications need to be refilled? (please list name of each medication and dose if known) Eliquis 5 mg, bid  2. Which pharmacy/location (including street and city if local pharmacy) is medication to be sent to?[facility] on [address]  3. Do they need a 30 day or 90 day supply? [place]

## 2019-04-26 NOTE — Telephone Encounter (Signed)
She can stop this and start Multaq normal usual dose.

## 2019-04-27 ENCOUNTER — Other Ambulatory Visit: Payer: Self-pay

## 2019-04-27 MED ORDER — APIXABAN 5 MG PO TABS: 5.0000 mg | ORAL_TABLET | Freq: Two times a day (BID) | ORAL | 0 refills | Status: DC

## 2019-04-27 NOTE — Telephone Encounter (Signed)
eliquis refill sent to [facility]

## 2019-04-28 ENCOUNTER — Other Ambulatory Visit: Payer: Self-pay

## 2019-04-28 ENCOUNTER — Other Ambulatory Visit: Payer: Self-pay | Admitting: Cardiology

## 2019-04-28 ENCOUNTER — Ambulatory Visit (INDEPENDENT_AMBULATORY_CARE_PROVIDER_SITE_OTHER): Payer: Medicare Other | Admitting: Cardiology

## 2019-04-28 ENCOUNTER — Ambulatory Visit (HOSPITAL_BASED_OUTPATIENT_CLINIC_OR_DEPARTMENT_OTHER)
Admission: RE | Admit: 2019-04-28 | Discharge: 2019-04-28 | Disposition: A | Discharge status: Home / Self Care | Payer: Medicare Other | Source: Ambulatory Visit | Attending: Cardiology | Admitting: Cardiology

## 2019-04-28 ENCOUNTER — Encounter: Payer: Self-pay | Admitting: Cardiology

## 2019-04-28 VITALS — BP 118/62 | HR 70 | Wt 223.0 lb

## 2019-04-28 DIAGNOSIS — I495 Sick sinus syndrome: Secondary | ICD-10-CM

## 2019-04-28 DIAGNOSIS — I480 Paroxysmal atrial fibrillation: Secondary | ICD-10-CM

## 2019-04-28 DIAGNOSIS — R0609 Other forms of dyspnea: Secondary | ICD-10-CM

## 2019-04-28 DIAGNOSIS — Z789 Other specified health status: Secondary | ICD-10-CM

## 2019-04-28 DIAGNOSIS — E785 Hyperlipidemia, unspecified: Secondary | ICD-10-CM

## 2019-04-28 DIAGNOSIS — I4729 Other ventricular tachycardia: Secondary | ICD-10-CM

## 2019-04-28 DIAGNOSIS — Z1329 Encounter for screening for other suspected endocrine disorder: Secondary | ICD-10-CM

## 2019-04-28 DIAGNOSIS — I10 Essential (primary) hypertension: Secondary | ICD-10-CM

## 2019-04-28 DIAGNOSIS — E1142 Type 2 diabetes mellitus with diabetic polyneuropathy: Secondary | ICD-10-CM

## 2019-04-28 DIAGNOSIS — I472 Ventricular tachycardia: Secondary | ICD-10-CM

## 2019-04-28 DIAGNOSIS — Z8673 Personal history of transient ischemic attack (TIA), and cerebral infarction without residual deficits: Secondary | ICD-10-CM

## 2019-04-28 DIAGNOSIS — Z45018 Encounter for adjustment and management of other part of cardiac pacemaker: Secondary | ICD-10-CM

## 2019-04-28 MED ORDER — FLECAINIDE ACETATE 50 MG PO TABS: 50.0000 mg | ORAL_TABLET | Freq: Two times a day (BID) | ORAL | 3 refills | Status: DC

## 2019-04-28 NOTE — Progress Notes (Signed)
Cardiology Office Note:    Date:  04/28/2019   ID:  LAURAN ROMANSKI, DOB [birth year], MRN [id number]  PCP:  Jefm Petty, MD  Cardiologist:  Jenean Lindau, MD   Referring MD: Jefm Petty, MD    ASSESSMENT:    No diagnosis found. PLAN:    In order of problems listed above:  1. Paroxysmal atrial fibrillation:I discussed with the patient atrial fibrillation, disease process. Management and therapy including rate and rhythm control, anticoagulation benefits and potential risks were discussed extensively with the patient. Patient had multiple questions which were answered to patient's satisfaction. 2. I discussed my findings with the patient at extensive length.  We will offer her amiodarone.  She is already discontinued it beginning Monday.  I wanted to initiate Multitak but she cannot afford it.  She will have blood work today including BNP and also I will get a chest x-ray because she has some shortness of breath issues though effort tolerance is excellent.  She recently had a stress test so I feel comfortable being initiating flecainide 50 mg twice daily.  She will be back on Tuesday for an EKG.  Benefits and potential risks explained to her extensively she vocalized understanding. 3. Patient will be seen in follow-up appointment in 6 months or earlier if the patient has any concerns 4.    Medication Adjustments/Labs and Tests Ordered: Current medicines are reviewed at length with the patient today.  Concerns regarding medicines are outlined above.  No orders of the defined types were placed in this encounter.  No orders of the defined types were placed in this encounter.    No chief complaint on file.    History of Present Illness:    Brooke Pitts is a 73 y.o. female.  Patient has paroxysmal atrial fibrillation.  She has been initiated on amiodarone therapy.  He tells me that she takes only 100 mg a day but has been having significant degree of insomnia with  this.  She wants to stop the medication she cannot afford Multitak.  No chest pain orthopnea or PND.  She started walking about 45 minutes a day without any problems.  At the time of my evaluation, the patient is alert awake oriented and in no distress.  Past Medical History:  Diagnosis Date  . A-fib ([place])   . Diabetes mellitus without complication ([place])   . Hypertension   . Stroke [facility])     Past Surgical History:  Procedure Laterality Date  . ABDOMINAL HYSTERECTOMY    . CHOLECYSTECTOMY    . PACEMAKER INSERTION    . TONSILLECTOMY      Current Medications: No outpatient medications have been marked as taking for the 04/28/19 encounter (Office Visit) with Viktoriya Glaspy, Reita Cliche, MD.     Allergies:   Cymbalta [duloxetine hcl], Hydrocodone, Lisinopril, Amlodipine, Duloxetine, Losartan, and Nsaids   Social History   Socioeconomic History  . Marital status: Married    Spouse name: Not on file  . Number of children: Not on file  . Years of education: Not on file  . Highest education level: Not on file  Occupational History  . Not on file  Social Needs  . Financial resource strain: Not on file  . Food insecurity    Worry: Not on file    Inability: Not on file  . Transportation needs    Medical: Not on file    Non-medical: Not on file  Tobacco Use  . Smoking status: Never Smoker  .  Smokeless tobacco: Never Used  Substance and Sexual Activity  . Alcohol use: No  . Drug use: No  . Sexual activity: Not on file  Lifestyle  . Physical activity    Days per week: Not on file    Minutes per session: Not on file  . Stress: Not on file  Relationships  . Social Musicianconnections    Talks on phone: Not on file    Gets together: Not on file    Attends religious service: Not on file    Active member of club or organization: Not on file    Attends meetings of clubs or organizations: Not on file    Relationship status: Not on file  Other Topics Concern  . Not on file  Social History  Narrative  . Not on file     Family History: The patient's family history includes Arrhythmia in her mother; Atrial fibrillation in her mother; Heart disease in her brother; Heart failure in her brother.  ROS:   Please see the history of present illness.    All other systems reviewed and are negative.  EKGs/Labs/Other Studies Reviewed:    The following studies were reviewed today: EKG reveals sinus rhythm and nonspecific ST-T changes   Recent Labs: 02/03/2019: ALT 19; Hemoglobin 12.8; Platelets 272 02/22/2019: BUN 8; Creatinine, Ser 0.77; Potassium 4.2; Sodium 139  Recent Lipid Panel    Component Value Date/Time   CHOL 165 01/28/2019 0911   TRIG 133 01/28/2019 0911   HDL 53 01/28/2019 0911   CHOLHDL 3.1 01/28/2019 0911   LDLCALC 85 01/28/2019 0911    Physical Exam:    VS:  BP 118/62 (BP Location: Right Arm, Patient Position: Sitting)   Pulse 70   Wt 223 lb (101.2 kg)   BMI 38.28 kg/m     Wt Readings from Last 3 Encounters:  04/28/19 223 lb (101.2 kg)  03/04/19 224 lb (101.6 kg)  02/25/19 224 lb (101.6 kg)     GEN: Patient is in no acute distress HEENT: Normal NECK: No JVD; No carotid bruits LYMPHATICS: No lymphadenopathy CARDIAC: Hear sounds regular, 2/6 systolic murmur at the apex. RESPIRATORY:  Clear to auscultation without rales, wheezing or rhonchi  ABDOMEN: Soft, non-tender, non-distended MUSCULOSKELETAL:  No edema; No deformity  SKIN: Warm and dry NEUROLOGIC:  Alert and oriented x 3 PSYCHIATRIC:  Normal affect   Signed, Garwin Brothersajan R Tylon Kemmerling, MD  04/28/2019 8:59 AM    [facility]

## 2019-04-28 NOTE — Patient Instructions (Addendum)
Medication Instructions:  Your physician has recommended you make the following change in your medication:  START taking flecainide 50 mg (1 tablet) twice daily  If you need a refill on your cardiac medications before your next appointment, please call your pharmacy.   Lab work: Your physician recommends that you have a BMET, TSH BNP and hepatic drawn.   If you have labs (blood work) drawn today and your tests are completely normal, you will receive your results only by: Marland Kitchen MyChart Message (if you have MyChart) OR . A paper copy in the mail If you have any lab test that is abnormal or we need to change your treatment, we will call you to review the results.  Testing/Procedures: You had an EKG performed today.   A chest x-ray takes a picture of the organs and structures inside the chest, including the heart, lungs, and blood vessels. This test can show several things, including, whether the heart is enlarges; whether fluid is building up in the lungs; and whether pacemaker / defibrillator leads are still in place.   YOU WILL come in on Tuesday for EKG   Follow-Up: At [facility], you and your health needs are our priority.  As part of our continuing mission to provide you with exceptional heart care, we have created designated Provider Care Teams.  These Care Teams include your primary Cardiologist (physician) and Advanced Practice Providers (APPs -  Physician Assistants and Nurse Practitioners) who all work together to provide you with the care you need, when you need it. You will need a follow up appointment in 3 days repeat EKG in [facility] office

## 2019-04-28 NOTE — Progress Notes (Signed)
Remote pacemaker transmission.   

## 2019-04-29 ENCOUNTER — Telehealth: Payer: Self-pay

## 2019-04-29 LAB — BASIC METABOLIC PANEL
BUN/Creatinine Ratio: 17 (ref 12–28)
BUN: 15 mg/dL (ref 8–27)
CO2: 24 mmol/L (ref 20–29)
Calcium: 9.4 mg/dL (ref 8.7–10.3)
Chloride: 96 mmol/L (ref 96–106)
Creatinine, Ser: 0.9 mg/dL (ref 0.57–1.00)
GFR calc Af Amer: 74 mL/min/{1.73_m2} (ref >59)
GFR calc non Af Amer: 65 mL/min/{1.73_m2} (ref >59)
Glucose: 168 mg/dL — ABNORMAL HIGH (ref 65–99)
Potassium: 4 mmol/L (ref 3.5–5.2)
Sodium: 137 mmol/L (ref 134–144)

## 2019-04-29 LAB — HEPATIC FUNCTION PANEL
ALT: 20 IU/L (ref 0–32)
AST: 21 IU/L (ref 0–40)
Albumin: 4.5 g/dL (ref 3.7–4.7)
Alkaline Phosphatase: 64 IU/L (ref 39–117)
Bilirubin Total: 0.7 mg/dL (ref 0.0–1.2)
Bilirubin, Direct: 0.16 mg/dL (ref 0.00–0.40)
Total Protein: 6.9 g/dL (ref 6.0–8.5)

## 2019-04-29 LAB — TSH: TSH: 2.57 u[IU]/mL (ref 0.450–4.500)

## 2019-04-29 LAB — PRO B NATRIURETIC PEPTIDE: NT-Pro BNP: 125 pg/mL (ref 0–301)

## 2019-04-29 NOTE — Telephone Encounter (Signed)
Patient called and notified of test and lab results. 

## 2019-04-29 NOTE — Telephone Encounter (Signed)
-----   Message from Jenean Lindau, MD sent at 04/29/2019  8:16 AM EDT ----- The results of the study is unremarkable. Please inform patient. I will discuss in detail at next appointment. Cc  primary care/referring physician Jenean Lindau, MD 04/29/2019 8:16 AM

## 2019-05-03 ENCOUNTER — Telehealth: Payer: Medicare Other | Admitting: Cardiology

## 2019-05-03 ENCOUNTER — Ambulatory Visit (INDEPENDENT_AMBULATORY_CARE_PROVIDER_SITE_OTHER): Payer: Medicare Other | Admitting: Cardiology

## 2019-05-03 ENCOUNTER — Other Ambulatory Visit: Payer: Self-pay

## 2019-05-03 DIAGNOSIS — I480 Paroxysmal atrial fibrillation: Secondary | ICD-10-CM | POA: Diagnosis not present

## 2019-05-03 NOTE — Patient Instructions (Signed)
Medication Instructions:  Your physician has recommended you make the following change in your medication:  STOP taking flecainide  If you need a refill on your cardiac medications before your next appointment, please call your pharmacy.   Lab work: NONE If you have labs (blood work) drawn today and your tests are completely normal, you will receive your results only by: Marland Kitchen MyChart Message (if you have MyChart) OR . A paper copy in the mail If you have any lab test that is abnormal or we need to change your treatment, we will call you to review the results.  Testing/Procedures: You had an EKG performed today.  Follow-Up: At [facility], you and your health needs are our priority.  As part of our continuing mission to provide you with exceptional heart care, we have created designated Provider Care Teams.  These Care Teams include your primary Cardiologist (physician) and Advanced Practice Providers (APPs -  Physician Assistants and Nurse Practitioners) who all work together to provide you with the care you need, when you need it. You will need a follow up appointment in 1 months.

## 2019-05-03 NOTE — Progress Notes (Signed)
Patient came in for an EKG.  She mentions to me that she was feeling exhausted with the flecainide so she is not taking the medicine anymore.  I told her not to use it.  She also does not take any other antiarrhythmic because of side effects or cost related issues.  We will leave her without antiarrhythmic therapy and monitor her closely.  She will be seen in follow-up appointment in a month or earlier if she has any questions she had multiple questions which were answered to her satisfaction.

## 2019-05-05 ENCOUNTER — Telehealth: Payer: Self-pay | Admitting: Cardiology

## 2019-05-05 NOTE — Telephone Encounter (Signed)
Patient would like to know the results to her last remote transmission.

## 2019-05-06 NOTE — Telephone Encounter (Signed)
Spoke with patient. Last transmission 04/19/19 showed normal device function. No issues and no change in condition.

## 2019-05-09 NOTE — Telephone Encounter (Signed)
LMOVM, informed pt that we do not have her scheduled for DC on 05/11/19.

## 2019-05-25 ENCOUNTER — Ambulatory Visit: Payer: Medicare Other | Admitting: Cardiology

## 2019-06-13 ENCOUNTER — Ambulatory Visit: Payer: Medicare Other | Admitting: Cardiology

## 2019-06-23 ENCOUNTER — Ambulatory Visit: Payer: Medicare Other | Admitting: Cardiology

## 2019-07-05 ENCOUNTER — Encounter: Payer: Self-pay | Admitting: Cardiology

## 2019-07-05 ENCOUNTER — Ambulatory Visit (INDEPENDENT_AMBULATORY_CARE_PROVIDER_SITE_OTHER): Payer: Medicare Other | Admitting: Cardiology

## 2019-07-05 ENCOUNTER — Other Ambulatory Visit: Payer: Self-pay

## 2019-07-05 VITALS — BP 124/78 | HR 72 | Temp 96.4°F | Ht 64.0 in | Wt 222.0 lb

## 2019-07-05 DIAGNOSIS — I472 Ventricular tachycardia: Secondary | ICD-10-CM | POA: Diagnosis not present

## 2019-07-05 DIAGNOSIS — Z789 Other specified health status: Secondary | ICD-10-CM

## 2019-07-05 DIAGNOSIS — E66811 Obesity, class 1: Secondary | ICD-10-CM

## 2019-07-05 DIAGNOSIS — Z950 Presence of cardiac pacemaker: Secondary | ICD-10-CM

## 2019-07-05 DIAGNOSIS — I480 Paroxysmal atrial fibrillation: Secondary | ICD-10-CM | POA: Diagnosis not present

## 2019-07-05 DIAGNOSIS — I495 Sick sinus syndrome: Secondary | ICD-10-CM | POA: Diagnosis not present

## 2019-07-05 DIAGNOSIS — E088 Diabetes mellitus due to underlying condition with unspecified complications: Secondary | ICD-10-CM

## 2019-07-05 DIAGNOSIS — I4729 Other ventricular tachycardia: Secondary | ICD-10-CM

## 2019-07-05 DIAGNOSIS — G4733 Obstructive sleep apnea (adult) (pediatric): Secondary | ICD-10-CM

## 2019-07-05 DIAGNOSIS — E669 Obesity, unspecified: Secondary | ICD-10-CM

## 2019-07-05 DIAGNOSIS — I10 Essential (primary) hypertension: Secondary | ICD-10-CM

## 2019-07-05 DIAGNOSIS — Z13220 Encounter for screening for lipoid disorders: Secondary | ICD-10-CM

## 2019-07-05 DIAGNOSIS — Z9989 Dependence on other enabling machines and devices: Secondary | ICD-10-CM

## 2019-07-05 NOTE — Patient Instructions (Signed)
Medication Instructions:  Your physician recommends that you continue on your current medications as directed. Please refer to the Current Medication list given to you today.  If you need a refill on your cardiac medications before your next appointment, please call your pharmacy.   Lab work: Your physician recommends that you have a BMP, Hepatic and lipid drawn today.   If you have labs (blood work) drawn today and your tests are completely normal, you will receive your results only by: Marland Kitchen MyChart Message (if you have MyChart) OR . A paper copy in the mail If you have any lab test that is abnormal or we need to change your treatment, we will call you to review the results.  Testing/Procedures: NONE  Follow-Up: At [facility], you and your health needs are our priority.  As part of our continuing mission to provide you with exceptional heart care, we have created designated Provider Care Teams.  These Care Teams include your primary Cardiologist (physician) and Advanced Practice Providers (APPs -  Physician Assistants and Nurse Practitioners) who all work together to provide you with the care you need, when you need it. You will need a follow up appointment in 6 months.

## 2019-07-05 NOTE — Progress Notes (Signed)
Cardiology Office Note:    Date:  07/05/2019   ID:  Brooke Pitts, DOB March 15, 1946, MRN [id number]  PCP:  Loyal Jacobson, MD  Cardiologist:  Garwin Brothers, MD   Referring MD: Loyal Jacobson, MD    ASSESSMENT:    1. PAF (paroxysmal atrial fibrillation) (HCC)   2. SSS (sick sinus syndrome) (HCC)   3. NSVT (nonsustained ventricular tachycardia) (HCC)   4. Essential hypertension   5. OSA on CPAP   6. Diabetes mellitus due to underlying condition with unspecified complications (HCC)   7. S/P placement of cardiac pacemaker   8. Statin intolerance   9. Obesity (BMI 30.0-34.9)    PLAN:    In order of problems listed above:  1. Paroxysmal atrial fibrillation: I discussed with the patient atrial fibrillation, disease process. Management and therapy including rate and rhythm control, anticoagulation benefits and potential risks were discussed extensively with the patient. Patient had multiple questions which were answered to patient's satisfaction. 2. Permanent pacemaker: Followed by our electrophysiology colleagues. 3. Mixed dyslipidemia: Diet was emphasized and we will check a blood work today. 4. Essential hypertension: Blood pressure stable.  Potassium supplementation and diet was encouraged 5. Patient will be seen in follow-up appointment in 6 months or earlier if the patient has any concerns    Medication Adjustments/Labs and Tests Ordered: Current medicines are reviewed at length with the patient today.  Concerns regarding medicines are outlined above.  No orders of the defined types were placed in this encounter.  No orders of the defined types were placed in this encounter.    Chief Complaint  Patient presents with  . Follow-up     History of Present Illness:    Brooke Pitts is a 73 y.o. female.  Patient has history of paroxysmal atrial fibrillation.  She denies any problems at this time and takes care of activities of daily living.  No chest pain  orthopnea or PND.  She is exercising on a regular basis and walks about half an hour a day 5 times a week.  She is very happy about this.  She has not lost weight and tells me that she has not very complacent with diet.  Past Medical History:  Diagnosis Date  . A-fib (HCC)   . Diabetes mellitus without complication (HCC)   . Hypertension   . Stroke [facility])     Past Surgical History:  Procedure Laterality Date  . ABDOMINAL HYSTERECTOMY    . CHOLECYSTECTOMY    . PACEMAKER INSERTION    . TONSILLECTOMY      Current Medications: Current Meds  Medication Sig  . apixaban (ELIQUIS) 5 MG TABS tablet Take 1 tablet (5 mg total) by mouth 2 (two) times daily.  Marland Kitchen atenolol (TENORMIN) 50 MG tablet Take 1.5 tablets (75 mg total) by mouth 2 (two) times a day.  . b complex vitamins tablet Take 1 tablet by mouth daily.  . chlorthalidone (HYGROTON) 50 MG tablet Take 1/2 tablet 2-3 days, then increase to 1 tab (50 mg) as needed for systolic BP greater than 150.  . irbesartan (AVAPRO) 300 MG tablet Take 300 mg by mouth daily.  . magnesium oxide (MAG-OX) 400 MG tablet Take 400 mg by mouth daily.  . metFORMIN (GLUCOPHAGE) 500 MG tablet Take by mouth 2 (two) times daily with a meal.  . nitroGLYCERIN (NITROSTAT) 0.4 MG SL tablet Place 1 tablet (0.4 mg total) under the tongue every 5 (five) minutes as needed for chest pain.  Marland Kitchen  Omega-3 Fatty Acids (FISH OIL) 1200 MG CPDR Take 1 capsule by mouth daily.   Glory Rosebush DELICA LANCETS 87F MISC   . ONETOUCH VERIO test strip      Allergies:   Flecainide, Cymbalta [duloxetine hcl], Hydrocodone, Lisinopril, Amlodipine, Duloxetine, Losartan, and Nsaids   Social History   Socioeconomic History  . Marital status: Married    Spouse name: Not on file  . Number of children: Not on file  . Years of education: Not on file  . Highest education level: Not on file  Occupational History  . Not on file  Social Needs  . Financial resource strain: Not on file  . Food  insecurity    Worry: Not on file    Inability: Not on file  . Transportation needs    Medical: Not on file    Non-medical: Not on file  Tobacco Use  . Smoking status: Never Smoker  . Smokeless tobacco: Never Used  Substance and Sexual Activity  . Alcohol use: No  . Drug use: No  . Sexual activity: Not on file  Lifestyle  . Physical activity    Days per week: Not on file    Minutes per session: Not on file  . Stress: Not on file  Relationships  . Social Herbalist on phone: Not on file    Gets together: Not on file    Attends religious service: Not on file    Active member of club or organization: Not on file    Attends meetings of clubs or organizations: Not on file    Relationship status: Not on file  Other Topics Concern  . Not on file  Social History Narrative  . Not on file     Family History: The patient's family history includes Arrhythmia in her mother; Atrial fibrillation in her mother; Heart disease in her brother; Heart failure in her brother.  ROS:   Please see the history of present illness.    All other systems reviewed and are negative.  EKGs/Labs/Other Studies Reviewed:    The following studies were reviewed today: I discussed my findings with her at length including lipids   Recent Labs: 02/03/2019: Hemoglobin 12.8; Platelets 272 04/28/2019: ALT 20; BUN 15; Creatinine, Ser 0.90; NT-Pro BNP 125; Potassium 4.0; Sodium 137; TSH 2.570  Recent Lipid Panel    Component Value Date/Time   CHOL 165 01/28/2019 0911   TRIG 133 01/28/2019 0911   HDL 53 01/28/2019 0911   CHOLHDL 3.1 01/28/2019 0911   LDLCALC 85 01/28/2019 0911    Physical Exam:    VS:  BP 124/78 (BP Location: Right Arm, Patient Position: Sitting, Cuff Size: Large)   Pulse 72   Temp (!) 96.4 F (35.8 C)   Ht 5\' 4"  (1.626 m)   Wt 222 lb (100.7 kg)   SpO2 98%   BMI 38.11 kg/m     Wt Readings from Last 3 Encounters:  07/05/19 222 lb (100.7 kg)  04/28/19 223 lb (101.2 kg)   03/04/19 224 lb (101.6 kg)     GEN: Patient is in no acute distress HEENT: Normal NECK: No JVD; No carotid bruits LYMPHATICS: No lymphadenopathy CARDIAC: Hear sounds regular, 2/6 systolic murmur at the apex. RESPIRATORY:  Clear to auscultation without rales, wheezing or rhonchi  ABDOMEN: Soft, non-tender, non-distended MUSCULOSKELETAL:  No edema; No deformity  SKIN: Warm and dry NEUROLOGIC:  Alert and oriented x 3 PSYCHIATRIC:  Normal affect   Signed, Jenean Lindau, MD  07/05/2019 9:50 AM    [facility]

## 2019-07-06 LAB — LIPID PANEL
Chol/HDL Ratio: 3.3 ratio (ref 0.0–4.4)
Cholesterol, Total: 181 mg/dL (ref 100–199)
HDL: 55 mg/dL (ref >39)
LDL Chol Calc (NIH): 97 mg/dL (ref 0–99)
Triglycerides: 166 mg/dL — ABNORMAL HIGH (ref 0–149)
VLDL Cholesterol Cal: 29 mg/dL (ref 5–40)

## 2019-07-06 LAB — HEPATIC FUNCTION PANEL
ALT: 17 IU/L (ref 0–32)
AST: 19 IU/L (ref 0–40)
Albumin: 4.6 g/dL (ref 3.7–4.7)
Alkaline Phosphatase: 79 IU/L (ref 39–117)
Bilirubin Total: 0.5 mg/dL (ref 0.0–1.2)
Bilirubin, Direct: 0.15 mg/dL (ref 0.00–0.40)
Total Protein: 6.6 g/dL (ref 6.0–8.5)

## 2019-07-06 LAB — BASIC METABOLIC PANEL
BUN/Creatinine Ratio: 11 — ABNORMAL LOW (ref 12–28)
BUN: 9 mg/dL (ref 8–27)
CO2: 25 mmol/L (ref 20–29)
Calcium: 9.2 mg/dL (ref 8.7–10.3)
Chloride: 97 mmol/L (ref 96–106)
Creatinine, Ser: 0.84 mg/dL (ref 0.57–1.00)
GFR calc Af Amer: 81 mL/min/{1.73_m2} (ref >59)
GFR calc non Af Amer: 70 mL/min/{1.73_m2} (ref >59)
Glucose: 206 mg/dL — ABNORMAL HIGH (ref 65–99)
Potassium: 3.8 mmol/L (ref 3.5–5.2)
Sodium: 135 mmol/L (ref 134–144)

## 2019-07-11 ENCOUNTER — Encounter: Payer: Self-pay | Admitting: *Deleted

## 2019-07-18 ENCOUNTER — Ambulatory Visit (INDEPENDENT_AMBULATORY_CARE_PROVIDER_SITE_OTHER): Payer: Medicare Other | Admitting: *Deleted

## 2019-07-18 DIAGNOSIS — I480 Paroxysmal atrial fibrillation: Secondary | ICD-10-CM | POA: Diagnosis not present

## 2019-07-18 DIAGNOSIS — I495 Sick sinus syndrome: Secondary | ICD-10-CM

## 2019-07-20 LAB — CUP PACEART REMOTE DEVICE CHECK
Battery Remaining Longevity: 57 mo
Battery Voltage: 2.99 V
Brady Statistic AP VP Percent: 0.08 %
Brady Statistic AP VS Percent: 97.18 %
Brady Statistic AS VP Percent: 0 %
Brady Statistic AS VS Percent: 2.74 %
Brady Statistic RA Percent Paced: 96.29 %
Brady Statistic RV Percent Paced: 0.11 %
Date Time Interrogation Session: 20201007131344
Implantable Lead Implant Date: 20150114
Implantable Lead Implant Date: 20150114
Implantable Lead Location: 753859
Implantable Lead Location: 753860
Implantable Lead Model: 5076
Implantable Lead Model: 5076
Implantable Pulse Generator Implant Date: 20150114
Lead Channel Impedance Value: 304 Ohm
Lead Channel Impedance Value: 399 Ohm
Lead Channel Impedance Value: 437 Ohm
Lead Channel Impedance Value: 532 Ohm
Lead Channel Pacing Threshold Amplitude: 0.875 V
Lead Channel Pacing Threshold Amplitude: 0.875 V
Lead Channel Pacing Threshold Pulse Width: 0.4 ms
Lead Channel Pacing Threshold Pulse Width: 0.4 ms
Lead Channel Sensing Intrinsic Amplitude: 1.75 mV
Lead Channel Sensing Intrinsic Amplitude: 1.75 mV
Lead Channel Sensing Intrinsic Amplitude: 16.25 mV
Lead Channel Sensing Intrinsic Amplitude: 16.25 mV
Lead Channel Setting Pacing Amplitude: 1.75 V
Lead Channel Setting Pacing Amplitude: 2 V
Lead Channel Setting Pacing Pulse Width: 0.4 ms
Lead Channel Setting Sensing Sensitivity: 0.9 mV

## 2019-07-26 NOTE — Progress Notes (Signed)
Remote pacemaker transmission.   

## 2019-08-07 ENCOUNTER — Other Ambulatory Visit: Payer: Self-pay | Admitting: Cardiology

## 2019-08-09 ENCOUNTER — Other Ambulatory Visit: Payer: Self-pay

## 2019-08-09 MED ORDER — ATENOLOL 50 MG PO TABS: 75.0000 mg | ORAL_TABLET | Freq: Every day | ORAL | 1 refills | Status: DC

## 2019-08-10 ENCOUNTER — Ambulatory Visit (INDEPENDENT_AMBULATORY_CARE_PROVIDER_SITE_OTHER): Payer: Medicare Other | Admitting: Cardiology

## 2019-08-10 ENCOUNTER — Other Ambulatory Visit: Payer: Self-pay

## 2019-08-10 ENCOUNTER — Encounter: Payer: Self-pay | Admitting: Cardiology

## 2019-08-10 VITALS — BP 138/88 | HR 81 | Ht 65.0 in | Wt 224.0 lb

## 2019-08-10 DIAGNOSIS — Z9989 Dependence on other enabling machines and devices: Secondary | ICD-10-CM

## 2019-08-10 DIAGNOSIS — I480 Paroxysmal atrial fibrillation: Secondary | ICD-10-CM | POA: Diagnosis not present

## 2019-08-10 DIAGNOSIS — Z01810 Encounter for preprocedural cardiovascular examination: Secondary | ICD-10-CM

## 2019-08-10 DIAGNOSIS — I495 Sick sinus syndrome: Secondary | ICD-10-CM

## 2019-08-10 DIAGNOSIS — E785 Hyperlipidemia, unspecified: Secondary | ICD-10-CM

## 2019-08-10 DIAGNOSIS — I4729 Other ventricular tachycardia: Secondary | ICD-10-CM

## 2019-08-10 DIAGNOSIS — I10 Essential (primary) hypertension: Secondary | ICD-10-CM

## 2019-08-10 DIAGNOSIS — I472 Ventricular tachycardia: Secondary | ICD-10-CM

## 2019-08-10 DIAGNOSIS — G4733 Obstructive sleep apnea (adult) (pediatric): Secondary | ICD-10-CM

## 2019-08-10 DIAGNOSIS — E088 Diabetes mellitus due to underlying condition with unspecified complications: Secondary | ICD-10-CM

## 2019-08-10 HISTORY — DX: Encounter for preprocedural cardiovascular examination: Z01.810

## 2019-08-10 MED ORDER — ATENOLOL 50 MG PO TABS: 75.0000 mg | ORAL_TABLET | Freq: Two times a day (BID) | ORAL | 1 refills | Status: DC

## 2019-08-10 NOTE — Patient Instructions (Signed)

## 2019-08-10 NOTE — Progress Notes (Signed)
Cardiology Office Note:    Date:  08/10/2019   ID:  RUMOR SUN, DOB April 04, 1946, MRN [id number]  PCP:  Jefm Petty, MD  Cardiologist:  Jenean Lindau, MD   Referring MD: Jefm Petty, MD    ASSESSMENT:    1. Preoperative cardiovascular examination   2. PAF (paroxysmal atrial fibrillation) ([place])   3. SSS (sick sinus syndrome) ([place])   4. NSVT (nonsustained ventricular tachycardia) ([place])   5. Essential hypertension   6. OSA on CPAP   7. Diabetes mellitus due to underlying condition with unspecified complications ([place])   8. Hyperlipidemia LDL goal <100    PLAN:    In order of problems listed above:  1. I discussed my findings with the patient at extensive length.  Her effort tolerance is excellent and in view of this I think she is not at high risk for coronary events during the aforementioned surgery.  Meticulous hemodynamic monitoring and uninterrupted continued perioperative beta-blockade will further reduce the risk of coronary events.  Her anticoagulation will have to be withheld for a day or 2 based on the surgeon's recommendations and I am fine with it.  Lovenox bridging, benefits and risks explained but she is not keen on it and she can initiate Eliquis as soon as felt okay by her surgeon post surgery. 2. Essential hypertension: Blood pressure stable 3. Diabetes mellitus dyslipidemia obesity: Risks were explained importance of weight reduction stressed and she promises to do better after surgery. 4. Patient will be seen in follow-up appointment in 6 months or earlier if the patient has any concerns    Medication Adjustments/Labs and Tests Ordered: Current medicines are reviewed at length with the patient today.  Concerns regarding medicines are outlined above.  No orders of the defined types were placed in this encounter.  No orders of the defined types were placed in this encounter.    No chief complaint on file.    History of Present Illness:     Brooke Pitts is a 73 y.o. female.  Patient has past medical history of atrial fibrillation on pacemaker.  She also is a diabetic.  She has a ovarian cyst and planning to undergo abdominal surgery.  She tells me that she is in excruciating pain because of the cyst and planning surgery in the next week or so.  No chest pain orthopnea or PND.  She is referred here for preop restratification.  The patient mentions to me that up to about 4 to 5 days ago before she started having these pain issues she would walk 45 minutes a day without any problems she did this 5 to 6 days a week.  With this she had no symptoms.  No chest pain orthopnea or PND.  Past Medical History:  Diagnosis Date  . A-fib ([place])   . Diabetes mellitus without complication ([place])   . Hypertension   . Stroke [facility])     Past Surgical History:  Procedure Laterality Date  . ABDOMINAL HYSTERECTOMY    . CHOLECYSTECTOMY    . PACEMAKER INSERTION    . TONSILLECTOMY      Current Medications: Current Meds  Medication Sig  . apixaban (ELIQUIS) 5 MG TABS tablet Take 1 tablet (5 mg total) by mouth 2 (two) times daily.  Marland Kitchen atenolol (TENORMIN) 50 MG tablet Take 1.5 tablets (75 mg total) by mouth 2 (two) times daily.  Marland Kitchen b complex vitamins tablet Take 1 tablet by mouth daily.  . chlorthalidone (HYGROTON) 50 MG tablet  Take 1/2 tablet 2-3 days, then increase to 1 tab (50 mg) as needed for systolic BP greater than 150. (Patient taking differently: 50 mg. Take 1/2 tablet daily.)  . dimenhyDRINATE (DRAMAMINE) 50 MG tablet Take 50 mg by mouth every 8 (eight) hours as needed.  . irbesartan (AVAPRO) 300 MG tablet Take 300 mg by mouth daily.  . magnesium oxide (MAG-OX) 400 MG tablet Take 400 mg by mouth daily.  . metFORMIN (GLUCOPHAGE) 500 MG tablet Take by mouth 2 (two) times daily with a meal.  . nitroGLYCERIN (NITROSTAT) 0.4 MG SL tablet Place 1 tablet (0.4 mg total) under the tongue every 5 (five) minutes as needed for chest pain.  . Omega-3  Fatty Acids (FISH OIL) 1200 MG CPDR Take 1 capsule by mouth daily.   [name] DELICA LANCETS 33G MISC   . ONETOUCH VERIO test strip      Allergies:   Flecainide, Cymbalta [duloxetine hcl], Hydrocodone, Lisinopril, Amlodipine, Duloxetine, Losartan, and Nsaids   Social History   Socioeconomic History  . Marital status: Married    Spouse name: Not on file  . Number of children: Not on file  . Years of education: Not on file  . Highest education level: Not on file  Occupational History  . Not on file  Social Needs  . Financial resource strain: Not on file  . Food insecurity    Worry: Not on file    Inability: Not on file  . Transportation needs    Medical: Not on file    Non-medical: Not on file  Tobacco Use  . Smoking status: Never Smoker  . Smokeless tobacco: Never Used  Substance and Sexual Activity  . Alcohol use: No  . Drug use: No  . Sexual activity: Not on file  Lifestyle  . Physical activity    Days per week: Not on file    Minutes per session: Not on file  . Stress: Not on file  Relationships  . Social Musician on phone: Not on file    Gets together: Not on file    Attends religious service: Not on file    Active member of club or organization: Not on file    Attends meetings of clubs or organizations: Not on file    Relationship status: Not on file  Other Topics Concern  . Not on file  Social History Narrative  . Not on file     Family History: The patient's family history includes Arrhythmia in her mother; Atrial fibrillation in her mother; Heart disease in her brother; Heart failure in her brother.  ROS:   Please see the history of present illness.    All other systems reviewed and are negative.  EKGs/Labs/Other Studies Reviewed:    The following studies were reviewed today: EKG reveals atrial paced rhythm and nonspecific ST-T changes.   Recent Labs: 02/03/2019: Hemoglobin 12.8; Platelets 272 04/28/2019: NT-Pro BNP 125; TSH 2.570  07/05/2019: ALT 17; BUN 9; Creatinine, Ser 0.84; Potassium 3.8; Sodium 135  Recent Lipid Panel    Component Value Date/Time   CHOL 181 07/05/2019 1011   TRIG 166 (H) 07/05/2019 1011   HDL 55 07/05/2019 1011   CHOLHDL 3.3 07/05/2019 1011   LDLCALC 97 07/05/2019 1011    Physical Exam:    VS:  BP 138/88 (BP Location: Right Arm, Patient Position: Sitting, Cuff Size: Large)   Pulse 81   Ht 5\' 5"  (1.651 m)   Wt 224 lb (101.6 kg)  SpO2 98%   BMI 37.28 kg/m     Wt Readings from Last 3 Encounters:  08/10/19 224 lb (101.6 kg)  07/05/19 222 lb (100.7 kg)  04/28/19 223 lb (101.2 kg)     GEN: Patient is in no acute distress HEENT: Normal NECK: No JVD; No carotid bruits LYMPHATICS: No lymphadenopathy CARDIAC: Hear sounds regular, 2/6 systolic murmur at the apex. RESPIRATORY:  Clear to auscultation without rales, wheezing or rhonchi  ABDOMEN: Soft, non-tender, non-distended MUSCULOSKELETAL:  No edema; No deformity  SKIN: Warm and dry NEUROLOGIC:  Alert and oriented x 3 PSYCHIATRIC:  Normal affect   Signed, Garwin Brothersajan R Jaquon Gingerich, MD  08/10/2019 4:10 PM    [facility]

## 2019-08-16 ENCOUNTER — Encounter: Payer: Self-pay | Admitting: Cardiology

## 2019-08-16 ENCOUNTER — Other Ambulatory Visit: Payer: Self-pay

## 2019-08-16 ENCOUNTER — Ambulatory Visit (INDEPENDENT_AMBULATORY_CARE_PROVIDER_SITE_OTHER): Payer: Medicare Other | Admitting: Cardiology

## 2019-08-16 VITALS — BP 126/88 | HR 80 | Ht 65.0 in | Wt 225.2 lb

## 2019-08-16 DIAGNOSIS — I495 Sick sinus syndrome: Secondary | ICD-10-CM

## 2019-08-16 NOTE — Progress Notes (Signed)
Electrophysiology Office Note   Date:  08/16/2019   ID:  Brooke, Pitts 13-Apr-1946, MRN [id number]  PCP:  Loyal Jacobson, MD  Cardiologist:  Revankar Primary Electrophysiologist:  Regan Lemming, MD    No chief complaint on file.    History of Present Illness: Brooke Pitts is a 73 y.o. female who is being seen today for the evaluation of atrial fibrillation at the request of Loyal Jacobson, MD. Presenting today for electrophysiology evaluation. She has a history of hypertension, hyperlipidemia, and diabetes. She had a pacemaker inserted in [facility]. She also has atrial fibrillation and has had a stroke in the past.  Today, denies symptoms of palpitations, chest pain, shortness of breath, orthopnea, PND, lower extremity edema, claudication, dizziness, presyncope, syncope, bleeding, or neurologic sequela. The patient is tolerating medications without difficulties.    Past Medical History:  Diagnosis Date  . A-fib (HCC)   . Diabetes mellitus without complication (HCC)   . Hypertension   . Stroke [facility])    Past Surgical History:  Procedure Laterality Date  . ABDOMINAL HYSTERECTOMY    . CHOLECYSTECTOMY    . PACEMAKER INSERTION    . TONSILLECTOMY       Current Outpatient Medications  Medication Sig Dispense Refill  . apixaban (ELIQUIS) 5 MG TABS tablet Take 1 tablet (5 mg total) by mouth 2 (two) times daily. 180 tablet 0  . atenolol (TENORMIN) 50 MG tablet Take 1.5 tablets (75 mg total) by mouth 2 (two) times daily. 270 tablet 1  . b complex vitamins tablet Take 1 tablet by mouth daily.    . chlorthalidone (HYGROTON) 50 MG tablet Take 1/2 tablet 2-3 days, then increase to 1 tab (50 mg) as needed for systolic BP greater than 150. 30 tablet 3  . dimenhyDRINATE (DRAMAMINE) 50 MG tablet Take 50 mg by mouth every 8 (eight) hours as needed.    . irbesartan (AVAPRO) 300 MG tablet Take 300 mg by mouth daily.    Marland Kitchen loratadine (CLARITIN) 10 MG tablet Take 10  mg by mouth daily as needed for allergies or rhinitis.    . magnesium oxide (MAG-OX) 400 MG tablet Take 400 mg by mouth daily.    . metFORMIN (GLUCOPHAGE) 500 MG tablet Take by mouth 2 (two) times daily with a meal.    . Omega-3 Fatty Acids (FISH OIL) 1200 MG CPDR Take 1 capsule by mouth daily.     [name] DELICA LANCETS 33G MISC     . ONETOUCH VERIO test strip     . nitroGLYCERIN (NITROSTAT) 0.4 MG SL tablet Place 1 tablet (0.4 mg total) under the tongue every 5 (five) minutes as needed for chest pain. 30 tablet 6   No current facility-administered medications for this visit.     Allergies:   Flecainide, Cymbalta [duloxetine hcl], Hydrocodone, Lisinopril, Amlodipine, Duloxetine, Losartan, and Nsaids   Social History:  The patient  reports that she has never smoked. She has never used smokeless tobacco. She reports that she does not drink alcohol or use drugs.   Family History:  The patient's family history includes Arrhythmia in her mother; Atrial fibrillation in her mother; Heart disease in her brother; Heart failure in her brother.    ROS:  Please see the history of present illness.   Otherwise, review of systems is positive for none.   All other systems are reviewed and negative.   PHYSICAL EXAM: VS:  BP 126/88   Pulse 80   Ht  5\' 5"  (1.651 m)   Wt 225 lb 3.2 oz (102.2 kg)   SpO2 98%   BMI 37.48 kg/m  , BMI Body mass index is 37.48 kg/m. GEN: Well nourished, well developed, in no acute distress  HEENT: normal  Neck: no JVD, carotid bruits, or masses Cardiac: RRR; no murmurs, rubs, or gallops,no edema  Respiratory:  clear to auscultation bilaterally, normal work of breathing GI: soft, nontender, nondistended, + BS MS: no deformity or atrophy  Skin: warm and dry, device site well healed Neuro:  Strength and sensation are intact Psych: euthymic mood, full affect  EKG:  EKG is not ordered today. Personal review of the ekg ordered 08/10/19 shows A paced  Personal review of  the device interrogation today. Results in [place]: 02/03/2019: Hemoglobin 12.8; Platelets 272 04/28/2019: NT-Pro BNP 125; TSH 2.570 07/05/2019: ALT 17; BUN 9; Creatinine, Ser 0.84; Potassium 3.8; Sodium 135    Lipid Panel     Component Value Date/Time   CHOL 181 07/05/2019 1011   TRIG 166 (H) 07/05/2019 1011   HDL 55 07/05/2019 1011   CHOLHDL 3.3 07/05/2019 1011   LDLCALC 97 07/05/2019 1011     Wt Readings from Last 3 Encounters:  08/16/19 225 lb 3.2 oz (102.2 kg)  08/10/19 224 lb (101.6 kg)  07/05/19 222 lb (100.7 kg)      Other studies Reviewed: Additional studies/ records that were reviewed today include: TTE 9/*13/18  Review of the above records today demonstrates:  - Left ventricle: The cavity size was normal. Wall thickness was   normal. Systolic function was normal. The estimated ejection   fraction was in the range of 55% to 60%. Doppler parameters are   consistent with abnormal left ventricular relaxation (grade 1   diastolic dysfunction). - Aortic valve: There was trivial regurgitation. - Atrial septum: No defect or patent foramen ovale was identified.  Myoview 9/*13/18  Nuclear stress EF: 65%.  This is a low risk study.  The left ventricular ejection fraction is normal (55-65%).  Normal study. No evidence of ischemia or previous infarction .   ASSESSMENT AND PLAN:  1.  Paroxysmal atrial fibrillation: Currently on Coumadin for anticoagulation.  No antiarrhythmics at this time.  Minimal atrial fibrillation noted on device interrogation.  No changes.    This patients CHA2DS2-VASc Score and unadjusted Ischemic Stroke Rate (% per year) is equal to 3.2 % stroke rate/year from a score of 3  Above score calculated as 1 point each if present [CHF, HTN, DM, Vascular=MI/PAD/Aortic Plaque, Age if 65-74, or Female] Above score calculated as 2 points each if present [Age > 75, or Stroke/TIA/TE]   2. Hypertension:well controlled  3. Sick sinus  syndrome: That is post Medtronic dual-chamber pacemaker.  Device functioning appropriately.  No changes.    Current medicines are reviewed at length with the patient today.   The patient does not have concerns regarding her medicines.  The following changes were made today: none  Labs/ tests ordered today include:  No orders of the defined types were placed in this encounter.    Disposition:   FU with Adriene Padula 1 year  Signed, Yared Barefoot Meredith Leeds, MD  08/16/2019 2:30 PM     [address] [place] [address] [phone] (office) [phone] (fax)

## 2019-10-17 ENCOUNTER — Ambulatory Visit (INDEPENDENT_AMBULATORY_CARE_PROVIDER_SITE_OTHER): Payer: Medicare Other | Admitting: *Deleted

## 2019-10-17 DIAGNOSIS — I495 Sick sinus syndrome: Secondary | ICD-10-CM

## 2019-10-19 LAB — CUP PACEART REMOTE DEVICE CHECK
Battery Remaining Longevity: 50 mo
Battery Voltage: 2.98 V
Brady Statistic AP VP Percent: 0.03 %
Brady Statistic AP VS Percent: 79 %
Brady Statistic AS VP Percent: 0 %
Brady Statistic AS VS Percent: 20.96 %
Brady Statistic RA Percent Paced: 78.36 %
Brady Statistic RV Percent Paced: 0.03 %
Date Time Interrogation Session: 20210105095542
Implantable Lead Implant Date: 20150114
Implantable Lead Implant Date: 20150114
Implantable Lead Location: 753859
Implantable Lead Location: 753860
Implantable Lead Model: 5076
Implantable Lead Model: 5076
Implantable Pulse Generator Implant Date: 20150114
Lead Channel Impedance Value: 342 Ohm
Lead Channel Impedance Value: 437 Ohm
Lead Channel Impedance Value: 475 Ohm
Lead Channel Impedance Value: 570 Ohm
Lead Channel Pacing Threshold Amplitude: 0.875 V
Lead Channel Pacing Threshold Amplitude: 0.875 V
Lead Channel Pacing Threshold Pulse Width: 0.4 ms
Lead Channel Pacing Threshold Pulse Width: 0.4 ms
Lead Channel Sensing Intrinsic Amplitude: 1.875 mV
Lead Channel Sensing Intrinsic Amplitude: 1.875 mV
Lead Channel Sensing Intrinsic Amplitude: 19.125 mV
Lead Channel Sensing Intrinsic Amplitude: 19.125 mV
Lead Channel Setting Pacing Amplitude: 1.75 V
Lead Channel Setting Pacing Amplitude: 2 V
Lead Channel Setting Pacing Pulse Width: 0.4 ms
Lead Channel Setting Sensing Sensitivity: 0.9 mV

## 2019-11-15 ENCOUNTER — Telehealth: Payer: Self-pay | Admitting: Cardiology

## 2019-11-15 MED ORDER — APIXABAN 5 MG PO TABS: 5.0000 mg | ORAL_TABLET | Freq: Two times a day (BID) | ORAL | 3 refills | Status: DC

## 2019-11-15 NOTE — Telephone Encounter (Signed)
Pt called to report that she heard Generic Eliquis to be available in Jan 2021..but I advised her that is not available to [place] yet and per the [facility] it may take a considerable amount of time before it is available... pt expressed her understanding and will send in refill for her current RX.

## 2019-11-15 NOTE — Telephone Encounter (Signed)
Pt c/o medication issue:  1. Name of Medication: apixaban (ELIQUIS) 5 MG TABS tablet  2. How are you currently taking this medication (dosage and times per day)? As directed  3. Are you having a reaction (difficulty breathing--STAT)? no  4. What is your medication issue? Patient wants the generic brand of Eliquis if possible then to have a prescription sent to [facility] [address] [place], [place] - [address]

## 2020-01-16 ENCOUNTER — Ambulatory Visit (INDEPENDENT_AMBULATORY_CARE_PROVIDER_SITE_OTHER): Payer: Medicare Other | Admitting: *Deleted

## 2020-01-16 DIAGNOSIS — I495 Sick sinus syndrome: Secondary | ICD-10-CM | POA: Diagnosis not present

## 2020-01-16 LAB — CUP PACEART REMOTE DEVICE CHECK
Battery Remaining Longevity: 53 mo
Battery Voltage: 2.98 V
Brady Statistic AP VP Percent: 0.03 %
Brady Statistic AP VS Percent: 66.65 %
Brady Statistic AS VP Percent: 0.01 %
Brady Statistic AS VS Percent: 33.31 %
Brady Statistic RA Percent Paced: 66.02 %
Brady Statistic RV Percent Paced: 0.04 %
Date Time Interrogation Session: 20210405091806
Implantable Lead Implant Date: 20150114
Implantable Lead Implant Date: 20150114
Implantable Lead Location: 753859
Implantable Lead Location: 753860
Implantable Lead Model: 5076
Implantable Lead Model: 5076
Implantable Pulse Generator Implant Date: 20150114
Lead Channel Impedance Value: 342 Ohm
Lead Channel Impedance Value: 437 Ohm
Lead Channel Impedance Value: 456 Ohm
Lead Channel Impedance Value: 551 Ohm
Lead Channel Pacing Threshold Amplitude: 0.625 V
Lead Channel Pacing Threshold Amplitude: 0.875 V
Lead Channel Pacing Threshold Pulse Width: 0.4 ms
Lead Channel Pacing Threshold Pulse Width: 0.4 ms
Lead Channel Sensing Intrinsic Amplitude: 1.625 mV
Lead Channel Sensing Intrinsic Amplitude: 1.625 mV
Lead Channel Sensing Intrinsic Amplitude: 17.5 mV
Lead Channel Sensing Intrinsic Amplitude: 17.5 mV
Lead Channel Setting Pacing Amplitude: 2 V
Lead Channel Setting Pacing Amplitude: 2 V
Lead Channel Setting Pacing Pulse Width: 0.4 ms
Lead Channel Setting Sensing Sensitivity: 0.9 mV

## 2020-01-17 ENCOUNTER — Ambulatory Visit (INDEPENDENT_AMBULATORY_CARE_PROVIDER_SITE_OTHER): Payer: Medicare Other | Admitting: Cardiology

## 2020-01-17 ENCOUNTER — Encounter: Payer: Self-pay | Admitting: Cardiology

## 2020-01-17 ENCOUNTER — Other Ambulatory Visit: Payer: Self-pay

## 2020-01-17 VITALS — BP 124/64 | HR 85 | Ht 65.0 in | Wt 226.0 lb

## 2020-01-17 DIAGNOSIS — Z7901 Long term (current) use of anticoagulants: Secondary | ICD-10-CM

## 2020-01-17 DIAGNOSIS — I480 Paroxysmal atrial fibrillation: Secondary | ICD-10-CM

## 2020-01-17 DIAGNOSIS — Z950 Presence of cardiac pacemaker: Secondary | ICD-10-CM

## 2020-01-17 NOTE — Progress Notes (Signed)
Cardiology Office Note:    Date:  01/17/2020   ID:  Brooke Pitts, DOB 1946-01-26, MRN [id number]  PCP:  Loyal Jacobson, MD  Cardiologist:  Garwin Brothers, MD   Referring MD: Loyal Jacobson, MD    ASSESSMENT:    1. PAF (paroxysmal atrial fibrillation) (HCC)   2. S/P placement of cardiac pacemaker   3. Long term current use of anticoagulant therapy    PLAN:    In order of problems listed above:  1. Primary prevention stressed with the patient.  Importance of compliance with diet and medication stressed and she vocalized understanding. 2. Paroxysmal atrial fibrillation:I discussed with the patient atrial fibrillation, disease process. Management and therapy including rate and rhythm control, anticoagulation benefits and potential risks were discussed extensively with the patient. Patient had multiple questions which were answered to patient's satisfaction. 3. Post permanent cardiac pacemaker: Followed by our electrophysiology colleague.  I reviewed records and discussed with the patient. 4. Essential hypertension: Blood pressure is stable 5. Mixed dyslipidemia and diabetes mellitus: These issues are managed by her primary care physician.  Diet was discussed.  She is overweight and I emphasized with her the importance of compliance with diet and exercise.  Weight reduction stressed she promises to comply.Patient will be seen in follow-up appointment in 6 months or earlier if the patient has any concerns    Medication Adjustments/Labs and Tests Ordered: Current medicines are reviewed at length with the patient today.  Concerns regarding medicines are outlined above.  No orders of the defined types were placed in this encounter.  No orders of the defined types were placed in this encounter.    Chief Complaint  Patient presents with  . Follow-up    6 Months     History of Present Illness:    Brooke Pitts is a 74 y.o. female.  Patient has past medical history of  paroxysmal atrial fibrillation diabetes mellitus and essential hypertension.  She has had history of stroke.  She recently underwent abdominal surgery for ovarian cyst.  Subsequently she is done fine and tells me that she is recovering well.  She is on anticoagulation without any issues.  No chest pain orthopnea or PND.  At the time of my evaluation, the patient is alert awake oriented and in no distress.  Past Medical History:  Diagnosis Date  . A-fib (HCC)   . Diabetes mellitus without complication (HCC)   . Hypertension   . Stroke [facility])     Past Surgical History:  Procedure Laterality Date  . ABDOMINAL HYSTERECTOMY    . CHOLECYSTECTOMY    . PACEMAKER INSERTION    . TONSILLECTOMY      Current Medications: Current Meds  Medication Sig  . apixaban (ELIQUIS) 5 MG TABS tablet Take 1 tablet (5 mg total) by mouth 2 (two) times daily.  Marland Kitchen atenolol (TENORMIN) 50 MG tablet Take 1.5 tablets (75 mg total) by mouth 2 (two) times daily.  Marland Kitchen b complex vitamins tablet Take 1 tablet by mouth daily.  . chlorthalidone (HYGROTON) 50 MG tablet Take 1/2 tablet 2-3 days, then increase to 1 tab (50 mg) as needed for systolic BP greater than 150.  Marland Kitchen dimenhyDRINATE (DRAMAMINE) 50 MG tablet Take 50 mg by mouth every 8 (eight) hours as needed.  . irbesartan (AVAPRO) 300 MG tablet Take 300 mg by mouth daily.  Marland Kitchen loratadine (CLARITIN) 10 MG tablet Take 10 mg by mouth daily as needed for allergies or rhinitis.  . magnesium oxide (MAG-OX)  400 MG tablet Take 400 mg by mouth daily.  . metFORMIN (GLUCOPHAGE) 500 MG tablet Take by mouth 2 (two) times daily with a meal.  . nitroGLYCERIN (NITROSTAT) 0.4 MG SL tablet Place 1 tablet (0.4 mg total) under the tongue every 5 (five) minutes as needed for chest pain.  . Omega-3 Fatty Acids (FISH OIL) 1200 MG CPDR Take 1 capsule by mouth daily.   [name] DELICA LANCETS 33G MISC   . ONETOUCH VERIO test strip      Allergies:   Flecainide, Cymbalta [duloxetine hcl],  Hydrocodone, Lisinopril, Amlodipine, Duloxetine, Losartan, and Nsaids   Social History   Socioeconomic History  . Marital status: Married    Spouse name: Not on file  . Number of children: Not on file  . Years of education: Not on file  . Highest education level: Not on file  Occupational History  . Not on file  Tobacco Use  . Smoking status: Never Smoker  . Smokeless tobacco: Never Used  Substance and Sexual Activity  . Alcohol use: No  . Drug use: No  . Sexual activity: Not on file  Other Topics Concern  . Not on file  Social History Narrative  . Not on file   Social Determinants of Health   Financial Resource Strain:   . Difficulty of Paying Living Expenses:   Food Insecurity:   . Worried About Programme researcher, broadcasting/film/video in the Last Year:   . Barista in the Last Year:   Transportation Needs:   . Freight forwarder (Medical):   Marland Kitchen Lack of Transportation (Non-Medical):   Physical Activity:   . Days of Exercise per Week:   . Minutes of Exercise per Session:   Stress:   . Feeling of Stress :   Social Connections:   . Frequency of Communication with Friends and Family:   . Frequency of Social Gatherings with Friends and Family:   . Attends Religious Services:   . Active Member of Clubs or Organizations:   . Attends Banker Meetings:   Marland Kitchen Marital Status:      Family History: The patient's family history includes Arrhythmia in her mother; Atrial fibrillation in her mother; Heart disease in her brother; Heart failure in her brother.  ROS:   Please see the history of present illness.    All other systems reviewed and are negative.  EKGs/Labs/Other Studies Reviewed:    The following studies were reviewed today: I reviewed lab work with the patient at length   Recent Labs: 02/03/2019: Hemoglobin 12.8; Platelets 272 04/28/2019: NT-Pro BNP 125; TSH 2.570 07/05/2019: ALT 17; BUN 9; Creatinine, Ser 0.84; Potassium 3.8; Sodium 135  Recent Lipid Panel     Component Value Date/Time   CHOL 181 07/05/2019 1011   TRIG 166 (H) 07/05/2019 1011   HDL 55 07/05/2019 1011   CHOLHDL 3.3 07/05/2019 1011   LDLCALC 97 07/05/2019 1011    Physical Exam:    VS:  BP 124/64   Pulse 85   Ht 5\' 5"  (1.651 m)   Wt 226 lb (102.5 kg)   SpO2 97%   BMI 37.61 kg/m     Wt Readings from Last 3 Encounters:  01/17/20 226 lb (102.5 kg)  08/16/19 225 lb 3.2 oz (102.2 kg)  08/10/19 224 lb (101.6 kg)     GEN: Patient is in no acute distress HEENT: Normal NECK: No JVD; No carotid bruits LYMPHATICS: No lymphadenopathy CARDIAC: Hear sounds regular, 2/6 systolic murmur  at the apex. RESPIRATORY:  Clear to auscultation without rales, wheezing or rhonchi  ABDOMEN: Soft, non-tender, non-distended MUSCULOSKELETAL:  No edema; No deformity  SKIN: Warm and dry NEUROLOGIC:  Alert and oriented x 3 PSYCHIATRIC:  Normal affect   Signed, Jenean Lindau, MD  01/17/2020 10:42 AM    [place]

## 2020-01-17 NOTE — Progress Notes (Signed)
PPM Remote  

## 2020-01-17 NOTE — Patient Instructions (Signed)

## 2020-04-17 ENCOUNTER — Ambulatory Visit (INDEPENDENT_AMBULATORY_CARE_PROVIDER_SITE_OTHER): Payer: Medicare Other | Admitting: *Deleted

## 2020-04-17 DIAGNOSIS — I495 Sick sinus syndrome: Secondary | ICD-10-CM | POA: Diagnosis not present

## 2020-04-17 LAB — CUP PACEART REMOTE DEVICE CHECK
Battery Remaining Longevity: 47 mo
Battery Voltage: 2.98 V
Brady Statistic AP VP Percent: 0.06 %
Brady Statistic AP VS Percent: 66.53 %
Brady Statistic AS VP Percent: 0.02 %
Brady Statistic AS VS Percent: 33.39 %
Brady Statistic RA Percent Paced: 66.18 %
Brady Statistic RV Percent Paced: 0.08 %
Date Time Interrogation Session: 20210706154637
Implantable Lead Implant Date: 20150114
Implantable Lead Implant Date: 20150114
Implantable Lead Location: 753859
Implantable Lead Location: 753860
Implantable Lead Model: 5076
Implantable Lead Model: 5076
Implantable Pulse Generator Implant Date: 20150114
Lead Channel Impedance Value: 323 Ohm
Lead Channel Impedance Value: 418 Ohm
Lead Channel Impedance Value: 456 Ohm
Lead Channel Impedance Value: 532 Ohm
Lead Channel Pacing Threshold Amplitude: 0.875 V
Lead Channel Pacing Threshold Amplitude: 0.875 V
Lead Channel Pacing Threshold Pulse Width: 0.4 ms
Lead Channel Pacing Threshold Pulse Width: 0.4 ms
Lead Channel Sensing Intrinsic Amplitude: 1.75 mV
Lead Channel Sensing Intrinsic Amplitude: 1.75 mV
Lead Channel Sensing Intrinsic Amplitude: 17 mV
Lead Channel Sensing Intrinsic Amplitude: 17 mV
Lead Channel Setting Pacing Amplitude: 1.75 V
Lead Channel Setting Pacing Amplitude: 2 V
Lead Channel Setting Pacing Pulse Width: 0.4 ms
Lead Channel Setting Sensing Sensitivity: 0.9 mV

## 2020-04-18 ENCOUNTER — Emergency Department (HOSPITAL_BASED_OUTPATIENT_CLINIC_OR_DEPARTMENT_OTHER): Payer: Medicare Other

## 2020-04-18 ENCOUNTER — Telehealth: Payer: Self-pay | Admitting: Emergency Medicine

## 2020-04-18 ENCOUNTER — Other Ambulatory Visit: Payer: Self-pay

## 2020-04-18 ENCOUNTER — Emergency Department (HOSPITAL_BASED_OUTPATIENT_CLINIC_OR_DEPARTMENT_OTHER)
Admission: EM | Admit: 2020-04-18 | Discharge: 2020-04-19 | Disposition: A | Discharge status: Home / Self Care | Payer: Medicare Other | Attending: Emergency Medicine | Admitting: Emergency Medicine

## 2020-04-18 ENCOUNTER — Encounter (HOSPITAL_BASED_OUTPATIENT_CLINIC_OR_DEPARTMENT_OTHER): Payer: Self-pay

## 2020-04-18 DIAGNOSIS — R072 Precordial pain: Secondary | ICD-10-CM | POA: Insufficient documentation

## 2020-04-18 DIAGNOSIS — R079 Chest pain, unspecified: Secondary | ICD-10-CM

## 2020-04-18 DIAGNOSIS — Z79899 Other long term (current) drug therapy: Secondary | ICD-10-CM | POA: Diagnosis not present

## 2020-04-18 DIAGNOSIS — Z7984 Long term (current) use of oral hypoglycemic drugs: Secondary | ICD-10-CM | POA: Diagnosis not present

## 2020-04-18 DIAGNOSIS — Z7901 Long term (current) use of anticoagulants: Secondary | ICD-10-CM | POA: Insufficient documentation

## 2020-04-18 DIAGNOSIS — R0602 Shortness of breath: Secondary | ICD-10-CM | POA: Diagnosis not present

## 2020-04-18 DIAGNOSIS — R5383 Other fatigue: Secondary | ICD-10-CM | POA: Insufficient documentation

## 2020-04-18 DIAGNOSIS — Z950 Presence of cardiac pacemaker: Secondary | ICD-10-CM | POA: Insufficient documentation

## 2020-04-18 DIAGNOSIS — I10 Essential (primary) hypertension: Secondary | ICD-10-CM | POA: Diagnosis not present

## 2020-04-18 DIAGNOSIS — R0789 Other chest pain: Secondary | ICD-10-CM

## 2020-04-18 DIAGNOSIS — E119 Type 2 diabetes mellitus without complications: Secondary | ICD-10-CM | POA: Insufficient documentation

## 2020-04-18 DIAGNOSIS — R609 Edema, unspecified: Secondary | ICD-10-CM | POA: Insufficient documentation

## 2020-04-18 LAB — BASIC METABOLIC PANEL
Anion gap: 12 (ref 5–15)
BUN: 16 mg/dL (ref 8–23)
CO2: 28 mmol/L (ref 22–32)
Calcium: 9.1 mg/dL (ref 8.9–10.3)
Chloride: 94 mmol/L — ABNORMAL LOW (ref 98–111)
Creatinine, Ser: 0.76 mg/dL (ref 0.44–1.00)
GFR calc Af Amer: >60 mL/min (ref >60)
GFR calc non Af Amer: >60 mL/min (ref >60)
Glucose, Bld: 137 mg/dL — ABNORMAL HIGH (ref 70–99)
Potassium: 3.2 mmol/L — ABNORMAL LOW (ref 3.5–5.1)
Sodium: 134 mmol/L — ABNORMAL LOW (ref 135–145)

## 2020-04-18 LAB — CBC
HCT: 39.7 % (ref 36.0–46.0)
Hemoglobin: 13.1 g/dL (ref 12.0–15.0)
MCH: 30.2 pg (ref 26.0–34.0)
MCHC: 33 g/dL (ref 30.0–36.0)
MCV: 91.5 fL (ref 80.0–100.0)
Platelets: 246 10*3/uL (ref 150–400)
RBC: 4.34 MIL/uL (ref 3.87–5.11)
RDW: 12.8 % (ref 11.5–15.5)
WBC: 7.9 10*3/uL (ref 4.0–10.5)
nRBC: 0 % (ref 0.0–0.2)

## 2020-04-18 LAB — BRAIN NATRIURETIC PEPTIDE: B Natriuretic Peptide: 36.6 pg/mL (ref 0.0–100.0)

## 2020-04-18 LAB — TROPONIN I (HIGH SENSITIVITY)
Troponin I (High Sensitivity): 4 ng/L (ref <18)
Troponin I (High Sensitivity): 3 ng/L (ref <18)

## 2020-04-18 LAB — D-DIMER, QUANTITATIVE: D-Dimer, Quant: 0.33 ug/mL-FEU (ref 0.00–0.50)

## 2020-04-18 LAB — MAGNESIUM: Magnesium: 1.7 mg/dL (ref 1.7–2.4)

## 2020-04-18 MED ORDER — SODIUM CHLORIDE 0.9% FLUSH
3.0000 mL | Freq: Once | INTRAVENOUS | Status: DC
  Filled 2020-04-18: qty 3

## 2020-04-18 NOTE — ED Triage Notes (Signed)
Pt c/o CP x today-NAD-to triage in w/c

## 2020-04-18 NOTE — Discharge Instructions (Addendum)
Your work-up today was overall reassuring.  Your exam was consistent with chest wall type pain given the tenderness on exam you experienced and your otherwise negative work-up.  We had a shared decision-making conversation and discussed the possibility of consultation of cardiology however, with your resolution of symptoms and otherwise well exam, we feel safe with you going home.  That being said, please call your cardiologist and PCP to be seen in the next 24 to 48 hours and if you develop any new or worsening symptoms including new or worsened chest pain, please return to the nearest emergency department.  Please rest and stay hydrated.

## 2020-04-18 NOTE — ED Provider Notes (Signed)
[facility] EMERGENCY DEPARTMENT Provider Note   CSN: [id number] Arrival date & time: 04/18/20  1910     History Chief Complaint  Patient presents with  . Chest Pain    Brooke Pitts is a 74 y.o. female.  The history is provided by the patient and medical records. No language interpreter was used.  Chest Pain Pain location:  L chest and substernal area Pain quality: aching, crushing and pressure   Pain radiates to:  Neck Pain severity:  Moderate Onset quality:  Gradual Duration:  9 hours Timing:  Constant Progression:  Improving Chronicity:  New Context: breathing   Relieved by:  Nothing Worsened by:  Exertion and deep breathing Ineffective treatments:  None tried Associated symptoms: fatigue, heartburn, lower extremity edema, nausea, palpitations and shortness of breath   Associated symptoms: no abdominal pain, no altered mental status, no back pain, no cough, no diaphoresis, no dizziness, no fever, no headache, no near-syncope, no numbness, no vomiting and no weakness        Past Medical History:  Diagnosis Date  . A-fib (HCC)   . Diabetes mellitus without complication (HCC)   . Hypertension   . Stroke [facility](HCC)     Patient Active Problem List   Diagnosis Date Noted  . Preoperative cardiovascular examination 08/10/2019  . Diabetes mellitus due to underlying condition with unspecified complications (HCC) 03/04/2019  . NSVT (nonsustained ventricular tachycardia) (HCC) 01/27/2019  . Statin intolerance 09/20/2018  . Localized edema 09/20/2018  . PAF (paroxysmal atrial fibrillation) (HCC) 01/27/2018  . 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 Obesity (BMI 30.0-34.9) 09/18/2016  . Risk for falls 08/21/2016  . Low magnesium level 07/08/2016  . Hypokalemia 06/30/2016  . Vitamin D deficiency 12/03/2015  . SSS (sick sinus syndrome) (HCC) 11/20/2015  . S/P placement of cardiac pacemaker 11/17/2015  . BPPV (benign paroxysmal positional vertigo)  11/17/2015  . CPAP/BiPAP dependence 11/17/2015  . Crohn's disease of ileum (HCC) 11/17/2015  . 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 History of cataract 11/17/2015  . History of osteopenia 11/17/2015  . OSA on CPAP 11/17/2015  . GERD (gastroesophageal reflux disease) 11/03/2015  . History of CVA (cerebrovascular accident) 10/18/2015  . Long term current use of anticoagulant therapy 10/18/2015  . Essential hypertension 10/03/2015  . Hyperlipidemia LDL goal <100 10/03/2015    Past Surgical History:  Procedure Laterality Date  . ABDOMINAL HYSTERECTOMY    . BILATERAL OOPHORECTOMY    . CHOLECYSTECTOMY    . PACEMAKER INSERTION    . TONSILLECTOMY       OB History    Gravida  5   Para  4   Term  4   Preterm  0   AB  1   Living  3     SAB  1   TAB  0   Ectopic  0   Multiple  0   Live Births  4           Family History  Problem Relation Age of Onset  . Arrhythmia Mother   . Atrial fibrillation Mother   . Heart disease Brother   . Heart failure Brother     Social History   Tobacco Use  . Smoking status: Never Smoker  . Smokeless tobacco: Never Used  Vaping Use  . Vaping Use: Never used  Substance Use Topics  . Alcohol use: No  . Drug use: No    Home Medications Prior to Admission medications   Medication Sig  Start Date End Date Taking? Authorizing Provider  apixaban (ELIQUIS) 5 MG TABS tablet Take 1 tablet (5 mg total) by mouth 2 (two) times daily. 11/15/19   Revankar, Aundra Dubin, MD  atenolol (TENORMIN) 50 MG tablet Take 1.5 tablets (75 mg total) by mouth 2 (two) times daily. 08/10/19   Revankar, Aundra Dubin, MD  b complex vitamins tablet Take 1 tablet by mouth daily.    [provider]  chlorthalidone (HYGROTON) 50 MG tablet Take 1/2 tablet 2-3 days, then increase to 1 tab (50 mg) as needed for systolic BP greater than 150. 10/15/22   Revankar, Aundra Dubin, MD  dimenhyDRINATE (DRAMAMINE) 50 MG tablet Take 50 mg by mouth every 8 (eight) hours as  needed.    [provider]  irbesartan (AVAPRO) 300 MG tablet Take 300 mg by mouth daily. 06/18/17   [provider]  loratadine (CLARITIN) 10 MG tablet Take 10 mg by mouth daily as needed for allergies or rhinitis.    [provider]  magnesium oxide (MAG-OX) 400 MG tablet Take 400 mg by mouth daily.    [provider]  metFORMIN (GLUCOPHAGE) 500 MG tablet Take by mouth 2 (two) times daily with a meal.    [provider]  nitroGLYCERIN (NITROSTAT) 0.4 MG SL tablet Place 1 tablet (0.4 mg total) under the tongue every 5 (five) minutes as needed for chest pain. 06/10/17 01/17/20  Revankar, Aundra Dubin, MD  Omega-3 Fatty Acids (FISH OIL) 1200 MG CPDR Take 1 capsule by mouth daily.     [provider]  Dola Argyle LANCETS 33G MISC  01/25/18   [provider]  [facility] VERIO test strip  01/25/18   [provider]    Allergies    Flecainide, Cymbalta [duloxetine hcl], Hydrocodone, Lisinopril, Amlodipine, Duloxetine, Losartan, and Nsaids  Review of Systems   Review of Systems  Constitutional: Positive for fatigue. Negative for chills, diaphoresis and fever.  HENT: Negative for congestion.   Eyes: Negative for visual disturbance.  Respiratory: Positive for chest tightness and shortness of breath. Negative for cough, choking, wheezing and stridor.   Cardiovascular: Positive for chest pain, palpitations and leg swelling. Negative for near-syncope.  Gastrointestinal: Positive for heartburn and nausea. Negative for abdominal distention, abdominal pain, constipation, diarrhea and vomiting.  Genitourinary: Negative for dysuria, flank pain and frequency.  Musculoskeletal: Negative for back pain, neck pain and neck stiffness.  Skin: Negative for rash and wound.  Neurological: Negative for dizziness, weakness, light-headedness, numbness and headaches.  Psychiatric/Behavioral: Negative for agitation and confusion.  All other systems reviewed  and are negative.   Physical Exam Updated Vital Signs BP (!) 173/93 (BP Location: Right Arm)   Pulse 70   Temp 97.8 F (36.6 C) (Oral)   Resp 19   Ht 5' 4.75" (1.645 m)   Wt 102.5 kg   SpO2 100%   BMI 37.90 kg/m   Physical Exam Vitals and nursing note reviewed.  Constitutional:      General: She is not in acute distress.    Appearance: She is well-developed. She is not ill-appearing, toxic-appearing or diaphoretic.  HENT:     Head: Normocephalic and atraumatic.  Eyes:     Extraocular Movements: Extraocular movements intact.     Conjunctiva/sclera: Conjunctivae normal.     Pupils: Pupils are equal, round, and reactive to light.  Cardiovascular:     Rate and Rhythm: Normal rate and regular rhythm.     Heart sounds: Normal heart sounds. No murmur heard.  Pulmonary:     Effort: Pulmonary effort is normal. No respiratory distress.     Breath sounds: Examination of the right-lower field reveals rales. Examination of the left-lower field reveals rales. Rales present. No decreased breath sounds, wheezing or rhonchi.  Chest:     Chest wall: Tenderness present.  Abdominal:     Palpations: Abdomen is soft.     Tenderness: There is no abdominal tenderness.  Musculoskeletal:     Cervical back: Neck supple.     Right lower leg: No tenderness. Edema present.     Left lower leg: No tenderness. Edema present.  Skin:    General: Skin is warm and dry.     Capillary Refill: Capillary refill takes less than 2 seconds.     Findings: No erythema.  Neurological:     General: No focal deficit present.     Mental Status: She is alert.  Psychiatric:        Mood and Affect: Mood normal.     ED Results / Procedures / Treatments   Labs (all labs ordered are listed, but only abnormal results are displayed) Labs Reviewed  BASIC METABOLIC PANEL - Abnormal; Notable for the following components:      Result Value   Sodium 134 (*)    Potassium 3.2 (*)    Chloride 94 (*)    Glucose, Bld  137 (*)    All other components within normal limits  CBC  BRAIN NATRIURETIC PEPTIDE  D-DIMER, QUANTITATIVE (NOT AT [facility])  MAGNESIUM  TROPONIN I (HIGH SENSITIVITY)  TROPONIN I (HIGH SENSITIVITY)    EKG EKG Interpretation  Date/Time:  Wednesday April 18 2020 19:17:23 EDT Ventricular Rate:  71 PR Interval:    QRS Duration: 82 QT Interval:  378 QTC Calculation: 410 R Axis:   0 Text Interpretation: Atrial-paced rhythm with prolonged AV conduction Minimal voltage criteria for LVH, may be normal variant ( R in aVL ) Possible Anterior infarct , age undetermined Abnormal ECG When compared to prior, now atrial paced similar to 2018. No STEMI Confirmed by Theda Belfast ([id number]) on 04/18/2020 7:25:49 PM   Radiology DG Chest 2 View  Result Date: 04/18/2020 CLINICAL DATA:  Chest pain, fatigue, dizziness and shortness of breath EXAM: CHEST - 2 VIEW COMPARISON:  Radiograph 04/27/2020, CT 11/02/2015 FINDINGS: Pacer pack overlies left chest wall with leads at the right atrium and directed towards the cardiac apex. No consolidation, features of edema, pneumothorax, or effusion. Pulmonary vascularity is normally distributed. The cardiomediastinal contours are unremarkable. No acute osseous or soft tissue abnormality. Cholecystectomy clips in the right upper quadrant. IMPRESSION: No acute cardiopulmonary abnormality. Electronically Signed   By: Kreg Shropshire M.D.   On: 04/18/2020 19:58   CUP PACEART REMOTE DEVICE CHECK  Result Date: 04/17/2020 Scheduled remote reviewed. Normal device function.  14 PAF episodes, longest 56 minutes, rates controlled. 10 "NSVT" episodes, all short and appear atrial. 1 VT-monitored episode (almost 2 min), ? atrial appear similar to other 20 fast A/V episodes, SVT with rates 150-170's. Longest episode approx 6 mins.   OAC- Eliquis. Routing for further review of episodes Next remote 91 days- JBox, RN/CVRS   Procedures Procedures (including critical care time)  Medications  Ordered in ED Medications  sodium chloride flush (NS) 0.9 % injection 3 mL (has no administration in time range)    ED Course  I have reviewed the triage vital signs and the nursing notes.  Pertinent labs & imaging results that were available during my care of  the patient were reviewed by me and considered in my medical decision making (see chart for details).    MDM Rules/Calculators/A&P                          KAMILLE TOOMEY is a 74 y.o. female with a past medical history significant for hypertension, hyperlipidemia, sick sinus syndrome status post pacemaker, atrial fibrillation on Eliquis therapy, diabetes, GERD, Crohn's disease, and prior BPPV who presents with gradually worsening peripheral edema, intermittent shortness of breath, and new chest pain today.  She reports that she started having fatigue after eating breakfast this morning but did not have discomfort.  She reports this afternoon around 1 PM, she started helping a pressure in her left chest that rates towards her left jaw.  She reports she has not had this type of discomfort in the past.  She reports does not go down her arm.  She reports some nausea but no vomiting.  She denies any diaphoresis but does say that she was short of breath.  She reports that her discomfort and shortness of breath or both pleuritic and exertional at times.  She reports that the edema has been worsening over the last few days mildly.  She denies any trauma.  She denies any fevers or chills but does report having palpitations at times.  She reports that she had an episode of palpitations the other day and interrogate her pacemaker at and was told that she was in A. fib.  She has been taking her Eliquis as directed.  She denies other complaints on arrival.  She reports that her discomfort is now a 2 out of 10 and is improved.  She reports its worse was a 7 out of 10 this afternoon.  On exam, lungs had no wheezing or rhonchi but did have some very faint  rales at the bases.  No murmur was appreciated.  Chest was tender to palpation.  Good pulses in extremities.  Legs are edematous but nontender.  Abdomen nontender.  Back and flanks were nontender.  EKG shows a paced rhythm similar to prior.  No STEMI.  Based on her history of diuretic use with peripheral edema and pacemaker, we will get a BNP to look for fluid overload.  Will get chest x-ray and labs to further evaluate.  Due to the pleuritic nature of her discomfort and being over 64 years old, will get a D-dimer to rule out pulmonary embolism as cause of her pleuritic discomfort.  Anticipate shared decision making conversation after work-up is completed.  Patient would like to go home if possible and this may be reasonable if work-up is otherwise reassuring.  Anticipate reassessment after work-up.      Work-up continue to return reassuring.  Her BNP not as not elevated.  Doubt worsening fluid overload.  D-dimer negative, doubt PE.  Troponin negative x2.  Mild hypokalemia and low sodium and chloride however otherwise labs reassuring.  Chest x-ray unremarkable.  EKG shows similar paced rhythm.  Had a shared decision-making conversation with patient and she would like to go home as her chest pain has completely resolved and it was tender on her chest suspect it was chest wall related.  She does not want to engage cardiology and would rather call her cardiologist tomorrow for further follow-up.  Given her well appearance and stability over 4 hours, we feel she is safe for going home.  Patient agrees with plan of care and follow-up.  Patient discharged in good condition with improved discomfort.   Final Clinical Impression(s) / ED Diagnoses Final diagnoses:  Atypical chest pain  Chest wall pain  Chest pain, unspecified type  Shortness of breath  Edema, unspecified type    Rx / DC Orders ED Discharge Orders    None     Clinical Impression: 1. Atypical chest pain   2. Chest wall pain     3. Chest pain, unspecified type   4. Shortness of breath   5. Edema, unspecified type     Disposition: Discharge  Condition: Good  I have discussed the results, Dx and Tx plan with the pt(& family if present). He/she/they expressed understanding and agree(s) with the plan. Discharge instructions discussed at great length. Strict return precautions discussed and pt &/or family have verbalized understanding of the instructions. No further questions at time of discharge.    New Prescriptions   No medications on file    Follow Up: Loyal Jacobson, MD [address] [place] [address] [phone]     Your cardiologist     [facility] [place] [facility]GENCY [facility]PARTMENT [address] [id number] [address] [place] [address] [phone]       Aerianna Losey, Canary Brim, MD 04/18/20 [phone]

## 2020-04-18 NOTE — ED Notes (Signed)
  Spoke with patients husband to give him an update.  Husband told he is allowed inside to sit with patient.

## 2020-04-18 NOTE — Telephone Encounter (Signed)
Brooke Pitts, Patient sees Dr. Elberta Fortis.  Please send it to him ASAP and to his nurse.  Kindly let me know that you have done so in view of the significance of these findings.  Thank you

## 2020-04-18 NOTE — Telephone Encounter (Signed)
Patient contacted due to increased episodes of AF, NSVT and 1 VT episode that fell in the monitored zone. Patient reports she has felt that her heart rate has been" out of rhythm" intermiitantly over the past few weeks. She reports that her energy level has been decreased , she has had intemittant SOB and chest pressure with activity over the past 2 weeks. Patient is taking atenolol 50 mg BID, + Eliquis. ED precautions given for SOB, CP , chest pressure , syncope. Will forward to Dr Josiah Lobo .

## 2020-04-19 ENCOUNTER — Telehealth: Payer: Self-pay | Admitting: Cardiology

## 2020-04-19 NOTE — Telephone Encounter (Signed)
Upon further review," VT episode appears to be atrial driven" on 02/29/20 , episode was 1 minute and 48 seconds in duration. Previously sent to Dr Elberta Fortis, and Dr Graciela Husbands. Will await recommendations.

## 2020-04-19 NOTE — Progress Notes (Signed)
Remote pacemaker transmission.   

## 2020-04-19 NOTE — Telephone Encounter (Signed)
° ° °  Pt said she got home from ED and need to see Dr. Tomie China, gave first available in aug pt said she cant wait that long and need to see heart doctor as soon as possible

## 2020-04-19 NOTE — Telephone Encounter (Signed)
lmtcb

## 2020-04-19 NOTE — Telephone Encounter (Signed)
Pt has an appt with Dr. Servando Salina in 04/20/20 for hospital FU.

## 2020-04-20 ENCOUNTER — Ambulatory Visit (INDEPENDENT_AMBULATORY_CARE_PROVIDER_SITE_OTHER): Payer: Medicare Other | Admitting: Cardiology

## 2020-04-20 ENCOUNTER — Encounter: Payer: Self-pay | Admitting: Cardiology

## 2020-04-20 ENCOUNTER — Other Ambulatory Visit: Payer: Self-pay

## 2020-04-20 VITALS — BP 108/76 | HR 74 | Ht 64.75 in | Wt 225.8 lb

## 2020-04-20 DIAGNOSIS — I4891 Unspecified atrial fibrillation: Secondary | ICD-10-CM | POA: Diagnosis not present

## 2020-04-20 DIAGNOSIS — Z950 Presence of cardiac pacemaker: Secondary | ICD-10-CM

## 2020-04-20 DIAGNOSIS — E785 Hyperlipidemia, unspecified: Secondary | ICD-10-CM

## 2020-04-20 DIAGNOSIS — I10 Essential (primary) hypertension: Secondary | ICD-10-CM | POA: Diagnosis not present

## 2020-04-20 DIAGNOSIS — E1142 Type 2 diabetes mellitus with diabetic polyneuropathy: Secondary | ICD-10-CM

## 2020-04-20 DIAGNOSIS — R0602 Shortness of breath: Secondary | ICD-10-CM | POA: Diagnosis not present

## 2020-04-20 DIAGNOSIS — Z7901 Long term (current) use of anticoagulants: Secondary | ICD-10-CM

## 2020-04-20 DIAGNOSIS — Z8673 Personal history of transient ischemic attack (TIA), and cerebral infarction without residual deficits: Secondary | ICD-10-CM

## 2020-04-20 MED ORDER — NITROGLYCERIN 0.4 MG SL SUBL: 0.4000 mg | SUBLINGUAL_TABLET | SUBLINGUAL | 6 refills | Status: DC | PRN

## 2020-04-20 NOTE — Patient Instructions (Addendum)
Medication Instructions:  No medication changes *If you need a refill on your cardiac medications before your next appointment, please call your pharmacy*   Lab Work: None ordered If you have labs (blood work) drawn today and your tests are completely normal, you will receive your results only by: . MyChart Message (if you have MyChart) OR . A paper copy in the mail If you have any lab test that is abnormal or we need to change your treatment, we will call you to review the results.   Testing/Procedures: Your physician has requested that you have an echocardiogram. Echocardiography is a painless test that uses sound waves to create images of your heart. It provides your doctor with information about the size and shape of your heart and how well your heart's chambers and valves are working. This procedure takes approximately one hour. There are no restrictions for this procedure.     Follow-Up: At [facility], you and your health needs are our priority.  As part of our continuing mission to provide you with exceptional heart care, we have created designated Provider Care Teams.  These Care Teams include your primary Cardiologist (physician) and Advanced Practice Providers (APPs -  Physician Assistants and Nurse Practitioners) who all work together to provide you with the care you need, when you need it.  We recommend signing up for the patient portal called "MyChart".  Sign up information is provided on this After Visit Summary.  MyChart is used to connect with patients for Virtual Visits (Telemedicine).  Patients are able to view lab/test results, encounter notes, upcoming appointments, etc.  Non-urgent messages can be sent to your provider as well.   To learn more about what you can do with MyChart, go to [email or web address].    Your next appointment:   1 month(s)  The format for your next appointment:   In Person  Provider:   Rajan Revankar, MD   Other  Instructions  Echocardiogram An echocardiogram is a procedure that uses painless sound waves (ultrasound) to produce an image of the heart. Images from an echocardiogram can provide important information about:  Signs of coronary artery disease (CAD).  Aneurysm detection. An aneurysm is a weak or damaged part of an artery wall that bulges out from the normal force of blood pumping through the body.  Heart size and shape. Changes in the size or shape of the heart can be associated with certain conditions, including heart failure, aneurysm, and CAD.  Heart muscle function.  Heart valve function.  Signs of a past heart attack.  Fluid buildup around the heart.  Thickening of the heart muscle.  A tumor or infectious growth around the heart valves. Tell a health care provider about:  Any allergies you have.  All medicines you are taking, including vitamins, herbs, eye drops, creams, and over-the-counter medicines.  Any blood disorders you have.  Any surgeries you have had.  Any medical conditions you have.  Whether you are pregnant or may be pregnant. What are the risks? Generally, this is a safe procedure. However, problems may occur, including:  Allergic reaction to dye (contrast) that may be used during the procedure. What happens before the procedure? No specific preparation is needed. You may eat and drink normally. What happens during the procedure?   An IV tube may be inserted into one of your veins.  You may receive contrast through this tube. A contrast is an injection that improves the quality of the pictures from your heart.  A   gel will be applied to your chest.  A wand-like tool (transducer) will be moved over your chest. The gel will help to transmit the sound waves from the transducer.  The sound waves will harmlessly bounce off of your heart to allow the heart images to be captured in real-time motion. The images will be recorded on a computer. The  procedure may vary among health care providers and hospitals. What happens after the procedure?  You may return to your normal, everyday life, including diet, activities, and medicines, unless your health care provider tells you not to do that. Summary  An echocardiogram is a procedure that uses painless sound waves (ultrasound) to produce an image of the heart.  Images from an echocardiogram can provide important information about the size and shape of your heart, heart muscle function, heart valve function, and fluid buildup around your heart.  You do not need to do anything to prepare before this procedure. You may eat and drink normally.  After the echocardiogram is completed, you may return to your normal, everyday life, unless your health care provider tells you not to do that. This information is not intended to replace advice given to you by your health care provider. Make sure you discuss any questions you have with your health care provider. Document Revised: 01/20/2019 Document Reviewed: 11/01/2016 Elsevier Patient Education  2020 Elsevier Inc.   

## 2020-04-20 NOTE — Telephone Encounter (Signed)
All appear atrial driven. Continue monitoring.

## 2020-04-20 NOTE — Progress Notes (Signed)
Cardiology Office Note:    Date:  04/20/2020   ID:  Brooke Pitts, DOB Dec 01, 1945, MRN [id number]  PCP:  Mendel Ryder, PA-C  Cardiologist:  No primary care provider on file.  Electrophysiologist:  Will Jorja Loa, MD   Referring MD: Mendel Ryder, PA-C   " I am here because I at the emergency room last night was at the emergency room last night"  History of Present Illness:    Brooke Pitts is a 74 y.o. female with a hx of diabetes mellitus, hypertension, CVA, atrial fibrillation on Eliquis 5 mg twice daily presents to be evaluated after she was seen at the [facility] emergency department overnight.  The patient tells me that she had over the last 24 hours she has had left-sided substernal chest pain.  Which she reported as nonradiating it was constant for over 8 to 9 hours.  She was seen at the emergency room at that time she also admitted to having some shortness of breath.  High sensitivity troponin done x2 was within normal limits.  The EKG is done in the urgency department overnight showed atrial paced rhythm with no significant ST changes suggesting any ischemia.  Her chest pain has resolved and she will have some shortness of breath.  Past Medical History:  Diagnosis Date  . A-fib (HCC)   . Diabetes mellitus without complication (HCC)   . Hypertension   . Stroke [facility])     Past Surgical History:  Procedure Laterality Date  . ABDOMINAL HYSTERECTOMY    . BILATERAL OOPHORECTOMY    . CHOLECYSTECTOMY    . PACEMAKER INSERTION    . TONSILLECTOMY      Current Medications: Current Meds  Medication Sig  . apixaban (ELIQUIS) 5 MG TABS tablet Take 1 tablet (5 mg total) by mouth 2 (two) times daily.  Marland Kitchen atenolol (TENORMIN) 50 MG tablet Take 1.5 tablets (75 mg total) by mouth 2 (two) times daily.  Marland Kitchen b complex vitamins tablet Take 1 tablet by mouth daily.  . chlorthalidone (HYGROTON) 50 MG tablet Take 1/2 tablet 2-3 days, then increase to 1 tab (50 mg) as needed for  systolic BP greater than 150.  Marland Kitchen dimenhyDRINATE (DRAMAMINE) 50 MG tablet Take 50 mg by mouth every 8 (eight) hours as needed.  . irbesartan (AVAPRO) 300 MG tablet Take 300 mg by mouth daily.  Marland Kitchen loratadine (CLARITIN) 10 MG tablet Take 10 mg by mouth daily as needed for allergies or rhinitis.  . magnesium oxide (MAG-OX) 400 MG tablet Take 400 mg by mouth daily.  . metFORMIN (GLUCOPHAGE) 500 MG tablet Take by mouth 2 (two) times daily with a meal.  . nitroGLYCERIN (NITROSTAT) 0.4 MG SL tablet Place 1 tablet (0.4 mg total) under the tongue every 5 (five) minutes as needed for chest pain.  . Omega-3 Fatty Acids (FISH OIL) 1200 MG CPDR Take 1 capsule by mouth daily.   [name] DELICA LANCETS 33G MISC   . ONETOUCH VERIO test strip   . [DISCONTINUED] nitroGLYCERIN (NITROSTAT) 0.4 MG SL tablet Place 1 tablet (0.4 mg total) under the tongue every 5 (five) minutes as needed for chest pain.     Allergies:   Flecainide, Cymbalta [duloxetine hcl], Hydrocodone, Lisinopril, Amlodipine, Duloxetine, Losartan, and Nsaids   Social History   Socioeconomic History  . Marital status: Married    Spouse name: Not on file  . Number of children: Not on file  . Years of education: Not on file  . Highest education level:  Not on file  Occupational History  . Not on file  Tobacco Use  . Smoking status: Never Smoker  . Smokeless tobacco: Never Used  Vaping Use  . Vaping Use: Never used  Substance and Sexual Activity  . Alcohol use: No  . Drug use: No  . Sexual activity: Not on file  Other Topics Concern  . Not on file  Social History Narrative  . Not on file   Social Determinants of Health   Financial Resource Strain:   . Difficulty of Paying Living Expenses:   Food Insecurity:   . Worried About Programme researcher, broadcasting/film/video in the Last Year:   . Barista in the Last Year:   Transportation Needs:   . Freight forwarder (Medical):   Marland Kitchen Lack of Transportation (Non-Medical):   Physical Activity:   .  Days of Exercise per Week:   . Minutes of Exercise per Session:   Stress:   . Feeling of Stress :   Social Connections:   . Frequency of Communication with Friends and Family:   . Frequency of Social Gatherings with Friends and Family:   . Attends Religious Services:   . Active Member of Clubs or Organizations:   . Attends Banker Meetings:   Marland Kitchen Marital Status:      Family History: The patient's family history includes Arrhythmia in her mother; Atrial fibrillation in her mother; Heart disease in her brother; Heart failure in her brother.  ROS:   Review of Systems  Constitution: Negative for decreased appetite, fever and weight gain.  HENT: Negative for congestion, ear discharge, hoarse voice and sore throat.   Eyes: Negative for discharge, redness, vision loss in right eye and visual halos.  Cardiovascular: Reports chest pain, dyspnea on exertion.  Negative for leg swelling, orthopnea and palpitations.  Respiratory: Negative for cough, hemoptysis, shortness of breath and snoring.   Endocrine: Negative for heat intolerance and polyphagia.  Hematologic/Lymphatic: Negative for bleeding problem. Does not bruise/bleed easily.  Skin: Negative for flushing, nail changes, rash and suspicious lesions.  Musculoskeletal: Negative for arthritis, joint pain, muscle cramps, myalgias, neck pain and stiffness.  Gastrointestinal: Negative for abdominal pain, bowel incontinence, diarrhea and excessive appetite.  Genitourinary: Negative for decreased libido, genital sores and incomplete emptying.  Neurological: Negative for brief paralysis, focal weakness, headaches and loss of balance.  Psychiatric/Behavioral: Negative for altered mental status, depression and suicidal ideas.  Allergic/Immunologic: Negative for HIV exposure and persistent infections.    EKGs/Labs/Other Studies Reviewed:    The following studies were reviewed today:   EKG:  The ekg ordered today demonstrates atrial  paced rhythm, heart rate 71 bpm with no ST changes suggesting acute ischemia.  Recent Labs: 04/28/2019: NT-Pro BNP 125; TSH 2.570 07/05/2019: ALT 17 04/18/2020: B Natriuretic Peptide 36.6; BUN 16; Creatinine, Ser 0.76; Hemoglobin 13.1; Magnesium 1.7; Platelets 246; Potassium 3.2; Sodium 134  Recent Lipid Panel    Component Value Date/Time   CHOL 181 07/05/2019 1011   TRIG 166 (H) 07/05/2019 1011   HDL 55 07/05/2019 1011   CHOLHDL 3.3 07/05/2019 1011   LDLCALC 97 07/05/2019 1011    Physical Exam:    VS:  BP 108/76   Pulse 74   Ht 5' 4.75" (1.645 m)   Wt 225 lb 12.8 oz (102.4 kg)   SpO2 99%   BMI 37.87 kg/m     Wt Readings from Last 3 Encounters:  04/20/20 225 lb 12.8 oz (102.4 kg)  04/18/20 226  lb (102.5 kg)  01/17/20 226 lb (102.5 kg)     GEN: Well nourished, well developed in no acute distress HEENT: Normal NECK: No JVD; No carotid bruits LYMPHATICS: No lymphadenopathy CARDIAC: S1S2 noted,RRR, no murmurs, rubs, gallops RESPIRATORY:  Clear to auscultation without rales, wheezing or rhonchi  ABDOMEN: Soft, non-tender, non-distended, +bowel sounds, no guarding. EXTREMITIES: No edema, No cyanosis, no clubbing MUSCULOSKELETAL:  No deformity  SKIN: Warm and dry NEUROLOGIC:  Alert and oriented x 3, non-focal PSYCHIATRIC:  Normal affect, good insight  ASSESSMENT:    1. Shortness of breath   2. Atrial fibrillation, unspecified type (HCC)   3. Essential hypertension   4. Type 2 diabetes mellitus with diabetic polyneuropathy, without long-term current use of insulin (HCC)   5. History of CVA (cerebrovascular accident)   6. Hyperlipidemia LDL goal <100   7. Long term current use of anticoagulant therapy   8. S/P placement of cardiac pacemaker    PLAN:     Her chest pain does sound atypical, I reviewed her troponin which was negative x2 her EKG is not impressive.  I have ordered as needed nitroglycerin for the patient.  We will continue to monitor her symptoms however in  the meantime we will get an echocardiogram to assess LV function for the shortness of breath and any other wall motion abnormality which will help in further assessment for diagnostic testing.  I reviewed her recent stress test which was done in May 2020 which was normal with low risk.  Shortness of breath-echo as listed above.   I have also advised the patient that if this pain recurs to go back to the nearest emergency department.  Atrial fibrillation continue patient on her beta-blocker as well as her Eliquis.  Diabetes mellitus continue medication per PCP.  The patient is in agreement with the above plan. The patient left the office in stable condition.  The patient will follow up in 1 month with Dr. Tomie China.   Medication Adjustments/Labs and Tests Ordered: Current medicines are reviewed at length with the patient today.  Concerns regarding medicines are outlined above.  Orders Placed This Encounter  Procedures  . ECHOCARDIOGRAM COMPLETE   Meds ordered this encounter  Medications  . nitroGLYCERIN (NITROSTAT) 0.4 MG SL tablet    Sig: Place 1 tablet (0.4 mg total) under the tongue every 5 (five) minutes as needed for chest pain.    Dispense:  30 tablet    Refill:  6    Patient Instructions  Medication Instructions:  No medication changes. *If you need a refill on your cardiac medications before your next appointment, please call your pharmacy*   Lab Work: None ordered If you have labs (blood work) drawn today and your tests are completely normal, you will receive your results only by: Marland Kitchen MyChart Message (if you have MyChart) OR . A paper copy in the mail If you have any lab test that is abnormal or we need to change your treatment, we will call you to review the results.   Testing/Procedures: Your physician has requested that you have an echocardiogram. Echocardiography is a painless test that uses sound waves to create images of your heart. It provides your doctor with  information about the size and shape of your heart and how well your heart's chambers and valves are working. This procedure takes approximately one hour. There are no restrictions for this procedure.     Follow-Up: At [facility], you and your health needs are our priority.  As part of our continuing mission to provide you with exceptional heart care, we have created designated Provider Care Teams.  These Care Teams include your primary Cardiologist (physician) and Advanced Practice Providers (APPs -  Physician Assistants and Nurse Practitioners) who all work together to provide you with the care you need, when you need it.  We recommend signing up for the patient portal called "MyChart".  Sign up information is provided on this After Visit Summary.  MyChart is used to connect with patients for Virtual Visits (Telemedicine).  Patients are able to view lab/test results, encounter notes, upcoming appointments, etc.  Non-urgent messages can be sent to your provider as well.   To learn more about what you can do with MyChart, go to [email or web address].    Your next appointment:   1 month(s)  The format for your next appointment:   In Person  Provider:   Belva Crome, MD   Other Instructions  Echocardiogram An echocardiogram is a procedure that uses painless sound waves (ultrasound) to produce an image of the heart. Images from an echocardiogram can provide important information about:  Signs of coronary artery disease (CAD).  Aneurysm detection. An aneurysm is a weak or damaged part of an artery wall that bulges out from the normal force of blood pumping through the body.  Heart size and shape. Changes in the size or shape of the heart can be associated with certain conditions, including heart failure, aneurysm, and CAD.  Heart muscle function.  Heart valve function.  Signs of a past heart attack.  Fluid buildup around the heart.  Thickening of the heart muscle.  A  tumor or infectious growth around the heart valves. Tell a health care provider about:  Any allergies you have.  All medicines you are taking, including vitamins, herbs, eye drops, creams, and over-the-counter medicines.  Any blood disorders you have.  Any surgeries you have had.  Any medical conditions you have.  Whether you are pregnant or may be pregnant. What are the risks? Generally, this is a safe procedure. However, problems may occur, including:  Allergic reaction to dye (contrast) that may be used during the procedure. What happens before the procedure? No specific preparation is needed. You may eat and drink normally. What happens during the procedure?   An IV tube may be inserted into one of your veins.  You may receive contrast through this tube. A contrast is an injection that improves the quality of the pictures from your heart.  A gel will be applied to your chest.  A wand-like tool (transducer) will be moved over your chest. The gel will help to transmit the sound waves from the transducer.  The sound waves will harmlessly bounce off of your heart to allow the heart images to be captured in real-time motion. The images will be recorded on a computer. The procedure may vary among health care providers and hospitals. What happens after the procedure?  You may return to your normal, everyday life, including diet, activities, and medicines, unless your health care provider tells you not to do that. Summary  An echocardiogram is a procedure that uses painless sound waves (ultrasound) to produce an image of the heart.  Images from an echocardiogram can provide important information about the size and shape of your heart, heart muscle function, heart valve function, and fluid buildup around your heart.  You do not need to do anything to prepare before this procedure. You may eat and drink normally.  After  the echocardiogram is completed, you may return to your  normal, everyday life, unless your health care provider tells you not to do that. This information is not intended to replace advice given to you by your health care provider. Make sure you discuss any questions you have with your health care provider. Document Revised: 01/20/2019 Document Reviewed: 11/01/2016 Elsevier Patient Education  2020 [employer]      Adopting a Healthy Lifestyle.  Know what a healthy weight is for you (roughly BMI <25) and aim to maintain this   Aim for 7+ servings of fruits and vegetables daily   65-80+ fluid ounces of water or unsweet tea for healthy kidneys   Limit to max 1 drink of alcohol per day; avoid smoking/tobacco   Limit animal fats in diet for cholesterol and heart health - choose grass fed whenever available   Avoid highly processed foods, and foods high in saturated/trans fats   Aim for low stress - take time to unwind and care for your mental health   Aim for 150 min of moderate intensity exercise weekly for heart health, and weights twice weekly for bone health   Aim for 7-9 hours of sleep daily   When it comes to diets, agreement about the perfect plan isnt easy to find, even among the experts. Experts at the [facility] [employer] developed an idea known as the Healthy Eating Plate. Just imagine a plate divided into logical, healthy portions.   The emphasis is on diet quality:   Load up on vegetables and fruits - one-half of your plate: Aim for color and variety, and remember that potatoes dont count.   Go for whole grains - one-quarter of your plate: Whole wheat, barley, wheat berries, quinoa, oats, brown rice, and foods made with them. If you want pasta, go with whole wheat pasta.   Protein power - one-quarter of your plate: Fish, chicken, beans, and nuts are all healthy, versatile protein sources. Limit red meat.   The diet, however, does go beyond the plate, offering a few other suggestions.   Use healthy plant  oils, such as olive, canola, soy, corn, sunflower and peanut. Check the labels, and avoid partially hydrogenated oil, which have unhealthy trans fats.   If youre thirsty, drink water. Coffee and tea are good in moderation, but skip sugary drinks and limit milk and dairy products to one or two daily servings.   The type of carbohydrate in the diet is more important than the amount. Some sources of carbohydrates, such as vegetables, fruits, whole grains, and beans-are healthier than others.   Finally, stay active  Signed, Thomasene RippleKardie Vince Ainsley, DO  04/20/2020 4:14 PM    [facility]

## 2020-04-23 NOTE — Telephone Encounter (Signed)
Patient aware there will be no change in treatment plan and to follow up with Dr Josiah Lobo as scheduled.

## 2020-05-11 ENCOUNTER — Other Ambulatory Visit: Payer: Self-pay

## 2020-05-11 ENCOUNTER — Ambulatory Visit (INDEPENDENT_AMBULATORY_CARE_PROVIDER_SITE_OTHER): Payer: Medicare Other

## 2020-05-11 DIAGNOSIS — R0602 Shortness of breath: Secondary | ICD-10-CM | POA: Diagnosis not present

## 2020-05-11 LAB — ECHOCARDIOGRAM COMPLETE
Area-P 1/2: 3.65 cm2
S' Lateral: 2.9 cm

## 2020-05-11 NOTE — Progress Notes (Signed)
Complete echocardiogram has been performed.  Jimmy Kosisochukwu Goldberg RDCS, RVT 

## 2020-05-12 ENCOUNTER — Other Ambulatory Visit: Payer: Self-pay | Admitting: Cardiology

## 2020-05-18 DIAGNOSIS — E119 Type 2 diabetes mellitus without complications: Secondary | ICD-10-CM | POA: Insufficient documentation

## 2020-05-18 DIAGNOSIS — I4891 Unspecified atrial fibrillation: Secondary | ICD-10-CM | POA: Insufficient documentation

## 2020-05-18 DIAGNOSIS — I10 Essential (primary) hypertension: Secondary | ICD-10-CM | POA: Insufficient documentation

## 2020-05-21 ENCOUNTER — Other Ambulatory Visit: Payer: Self-pay

## 2020-05-21 ENCOUNTER — Ambulatory Visit (INDEPENDENT_AMBULATORY_CARE_PROVIDER_SITE_OTHER): Payer: Medicare Other | Admitting: Cardiology

## 2020-05-21 ENCOUNTER — Encounter: Payer: Self-pay | Admitting: Cardiology

## 2020-05-21 VITALS — BP 162/80 | HR 72 | Ht 64.0 in | Wt 223.0 lb

## 2020-05-21 DIAGNOSIS — I480 Paroxysmal atrial fibrillation: Secondary | ICD-10-CM

## 2020-05-21 DIAGNOSIS — Z8673 Personal history of transient ischemic attack (TIA), and cerebral infarction without residual deficits: Secondary | ICD-10-CM

## 2020-05-21 DIAGNOSIS — E088 Diabetes mellitus due to underlying condition with unspecified complications: Secondary | ICD-10-CM

## 2020-05-21 DIAGNOSIS — I10 Essential (primary) hypertension: Secondary | ICD-10-CM

## 2020-05-21 DIAGNOSIS — G4733 Obstructive sleep apnea (adult) (pediatric): Secondary | ICD-10-CM

## 2020-05-21 DIAGNOSIS — R072 Precordial pain: Secondary | ICD-10-CM

## 2020-05-21 DIAGNOSIS — Z950 Presence of cardiac pacemaker: Secondary | ICD-10-CM

## 2020-05-21 DIAGNOSIS — Z9989 Dependence on other enabling machines and devices: Secondary | ICD-10-CM

## 2020-05-21 DIAGNOSIS — Z7901 Long term (current) use of anticoagulants: Secondary | ICD-10-CM

## 2020-05-21 NOTE — Progress Notes (Signed)
Cardiology Office Note:    Date:  05/21/2020   ID:  Brooke Pitts, DOB 03/24/1946, MRN [id number]  PCP:  Mendel Ryder, PA-C  Cardiologist:  Garwin Brothers, MD   Referring MD: Mendel Ryder, PA-C    ASSESSMENT:    1. Essential hypertension   2. PAF (paroxysmal atrial fibrillation) (HCC)   3. Diabetes mellitus due to underlying condition with unspecified complications (HCC)   4. History of CVA (cerebrovascular accident)   5. Long term current use of anticoagulant therapy   6. OSA on CPAP   7. S/P placement of cardiac pacemaker    PLAN:    In order of problems listed above:  1. Primary prevention stressed with the patient.  Importance of compliance with diet and medication stressed and she vocalized understanding. 2. Dyspnea on exertion: Her symptoms are concerning.  I wonder whether this is an anginal equivalent.  In view of this I recommended CT coronary angiography with FFR and the patient is agreeable.  This will also help [place] assess pulmonary anatomy.  We will schedule this in the next few days.  If she has any significant symptoms she knows to go to nearest emergency room. 3. Essential hypertension: Patient mentions to me that blood pressure numbers at home are fine and she mentioned the numbers to me.  They seem to be within normal limits 4. Paroxysmal atrial fibrillation:I discussed with the patient atrial fibrillation, disease process. Management and therapy including rate and rhythm control, anticoagulation benefits and potential risks were discussed extensively with the patient. Patient had multiple questions which were answered to patient's satisfaction. 5. Mixed dyslipidemia and diabetes mellitus: Diet was emphasized.  She is statin intolerant.  She will be seen in follow-up appointment in 6 months or earlier if she has any concerns.  Patient had multiple questions which were answered to her satisfaction.   Medication Adjustments/Labs and Tests Ordered: Current  medicines are reviewed at length with the patient today.  Concerns regarding medicines are outlined above.  No orders of the defined types were placed in this encounter.  No orders of the defined types were placed in this encounter.    No chief complaint on file.    History of Present Illness:    Brooke Pitts is a 74 y.o. female.  Patient has past medical history of essential hypertension, dyslipidemia, diabetes mellitus and paroxysmal atrial fibrillation.  She went to the emergency room recently for evaluation and was evaluated and discharged.  Subsequently she is seen my partner.  I reviewed those notes extensively.  Patient now mentions to me that upon exertion she has shortness of breath and she does not feel that she is completely relieved with the symptoms.  She is concerned about them.  And wants an evaluation.  No orthopnea or PND.  At the time of my evaluation, the patient is alert awake oriented and in no distress.  Past Medical History:  Diagnosis Date  . A-fib (HCC)   . BPPV (benign paroxysmal positional vertigo) 11/17/2015  . CPAP/BiPAP dependence 11/17/2015  . Crohn's disease of ileum (HCC) 11/17/2015  . Diabetes mellitus due to underlying condition with unspecified complications (HCC) 03/04/2019  . Diabetes mellitus without complication (HCC)   . 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 Essential hypertension 10/03/2015  . GERD (gastroesophageal reflux disease) 11/03/2015  . History of cataract 11/17/2015  . History of CVA (cerebrovascular accident) 10/18/2015   Overview:  May 2014 or 2015  Formatting of this note might be  different from the original. Overview:  Overview:  May 2014 or 2015 Formatting of this note might be different from the original. May 2014 or 2015  . History of osteopenia 11/17/2015  . Hyperlipidemia LDL goal <100 10/03/2015   Overview:  Multi statin intolerant  Formatting of this note might be different from the original. Overview:  Overview:  Multi statin  intolerant Formatting of this note might be different from the original. Multi statin intolerant. Statin intolerance will observe. No need to draw lipid panel in future.  . Hypertension   . Hypokalemia 06/30/2016  . Localized edema 09/20/2018  . Long term current use of anticoagulant therapy 10/18/2015  . Low magnesium level 07/08/2016  . NSVT (nonsustained ventricular tachycardia) (HCC) 01/27/2019  . Obesity (BMI 30.0-34.9) 09/18/2016  . OSA on CPAP 11/17/2015  . Pacemaker reprogramming/check 11/20/2015  . PAF (paroxysmal atrial fibrillation) (HCC) 01/27/2018  . Preoperative cardiovascular examination 08/10/2019  . 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 Risk for falls 08/21/2016  . S/P placement of cardiac pacemaker 11/17/2015  . SSS (sick sinus syndrome) (HCC) 11/20/2015  . Statin intolerance 09/20/2018  . Stroke (HCC)   . Type 2 diabetes mellitus (HCC) 11/02/2015  . Vitamin D deficiency 12/03/2015    Past Surgical History:  Procedure Laterality Date  . ABDOMINAL HYSTERECTOMY    . BILATERAL OOPHORECTOMY    . CHOLECYSTECTOMY    . PACEMAKER INSERTION    . TONSILLECTOMY      Current Medications: Current Meds  Medication Sig  . apixaban (ELIQUIS) 5 MG TABS tablet Take 1 tablet (5 mg total) by mouth 2 (two) times daily.  Marland Kitchen atenolol (TENORMIN) 50 MG tablet TAKE 1 & 1/2 TABLETS BY MOUTH TWICE DAILY (Patient taking differently: 50 mg 2 (two) times daily. )  . b complex vitamins tablet Take 1 tablet by mouth daily.  . chlorthalidone (HYGROTON) 50 MG tablet Take 1/2 tablet 2-3 days, then increase to 1 tab (50 mg) as needed for systolic BP greater than 150.  Marland Kitchen dimenhyDRINATE (DRAMAMINE) 50 MG tablet Take 50 mg by mouth every 8 (eight) hours as needed.  . irbesartan (AVAPRO) 300 MG tablet Take 300 mg by mouth daily.  Marland Kitchen loratadine (CLARITIN) 10 MG tablet Take 10 mg by mouth daily as needed for allergies or rhinitis.  . magnesium oxide (MAG-OX) 400 MG tablet Take 400 mg by mouth daily.  . metFORMIN  (GLUCOPHAGE) 500 MG tablet Take 500 mg by mouth. TAKE 1 (500MG )  TABLET AM AND 2 (1000MG ) TABLETS PM  . nitroGLYCERIN (NITROSTAT) 0.4 MG SL tablet Place 1 tablet (0.4 mg total) under the tongue every 5 (five) minutes as needed for chest pain.  . Omega-3 Fatty Acids (FISH OIL) 1200 MG CPDR Take 1 capsule by mouth daily.   DELICA LANCETS 33G MISC   . ONETOUCH VERIO test strip      Allergies:   Flecainide, Cymbalta [duloxetine hcl], Hydrocodone, Lisinopril, Amlodipine, Duloxetine, Losartan, and Nsaids   Social History   Socioeconomic History  . Marital status: Married    Spouse name: Not on file  . Number of children: Not on file  . Years of education: Not on file  . Highest education level: Not on file  Occupational History  . Not on file  Tobacco Use  . Smoking status: Never Smoker  . Smokeless tobacco: Never Used  Vaping Use  . Vaping Use: Never used  Substance and Sexual Activity  . Alcohol use: No  . Drug use:  No  . Sexual activity: Not on file  Other Topics Concern  . Not on file  Social History Narrative  . Not on file   Social Determinants of Health   Financial Resource Strain:   . Difficulty of Paying Living Expenses:   Food Insecurity:   . Worried About Programme researcher, broadcasting/film/video in the Last Year:   . Barista in the Last Year:   Transportation Needs:   . Freight forwarder (Medical):   Marland Kitchen Lack of Transportation (Non-Medical):   Physical Activity:   . Days of Exercise per Week:   . Minutes of Exercise per Session:   Stress:   . Feeling of Stress :   Social Connections:   . Frequency of Communication with Friends and Family:   . Frequency of Social Gatherings with Friends and Family:   . Attends Religious Services:   . Active Member of Clubs or Organizations:   . Attends Banker Meetings:   Marland Kitchen Marital Status:      Family History: The patient's family history includes Arrhythmia in her mother; Atrial fibrillation in her mother;  Heart disease in her brother; Heart failure in her brother.  ROS:   Please see the history of present illness.    All other systems reviewed and are negative.  EKGs/Labs/Other Studies Reviewed:    The following studies were reviewed today: I reviewed EKG done in the emergency room.   Recent Labs: 07/05/2019: ALT 17 04/18/2020: B Natriuretic Peptide 36.6; BUN 16; Creatinine, Ser 0.76; Hemoglobin 13.1; Magnesium 1.7; Platelets 246; Potassium 3.2; Sodium 134  Recent Lipid Panel    Component Value Date/Time   CHOL 181 07/05/2019 1011   TRIG 166 (H) 07/05/2019 1011   HDL 55 07/05/2019 1011   CHOLHDL 3.3 07/05/2019 1011   LDLCALC 97 07/05/2019 1011    Physical Exam:    VS:  BP (!) 162/80   Pulse 72   Ht 5\' 4"  (1.626 m)   Wt 223 lb (101.2 kg)   SpO2 99%   BMI 38.28 kg/m     Wt Readings from Last 3 Encounters:  05/21/20 223 lb (101.2 kg)  04/20/20 225 lb 12.8 oz (102.4 kg)  04/18/20 226 lb (102.5 kg)     GEN: Patient is in no acute distress HEENT: Normal NECK: No JVD; No carotid bruits LYMPHATICS: No lymphadenopathy CARDIAC: Hear sounds regular, 2/6 systolic murmur at the apex. RESPIRATORY:  Clear to auscultation without rales, wheezing or rhonchi  ABDOMEN: Soft, non-tender, non-distended MUSCULOSKELETAL:  No edema; No deformity  SKIN: Warm and dry NEUROLOGIC:  Alert and oriented x 3 PSYCHIATRIC:  Normal affect   Signed, 06/19/20, MD  05/21/2020 2:44 PM    [facility]

## 2020-05-21 NOTE — Patient Instructions (Signed)
Medication Instructions:  No medication changes. *If you need a refill on your cardiac medications before your next appointment, please call your pharmacy*   Lab Work: Your physician recommends that you return for lab work in: 1 week prior to your CT. You can come Monday through Friday 8:30 am to 12:00 pm and 1:15 to 4:30. You do not need to make an appointment as the order has already been placed.    If you have labs (blood work) drawn today and your tests are completely normal, you will receive your results only by: Marland Kitchen MyChart Message (if you have MyChart) OR . A paper copy in the mail If you have any lab test that is abnormal or we need to change your treatment, we will call you to review the results.   Testing/Procedures: Your cardiac CT will be scheduled at:   [facility] [place], [place] [address] [id number]   If scheduled at [facility], please arrive at the [facility] main entrance of [facility] 30 minutes prior to test start time. Proceed to the [facility] Radiology Department (first floor) to check-in and test prep.  Please follow these instructions carefully (unless otherwise directed):  On the Night Before the Test: . Be sure to Drink plenty of water. . Do not consume any caffeinated/decaffeinated beverages or chocolate 12 hours prior to your test. . Do not take any antihistamines 12 hours prior to your test.   On the Day of the Test: . Drink plenty of water. Do not drink any water within one hour of the test. . Do not eat any food 4 hours prior to the test. . You may take your regular medications prior to the test.  . Take Atenolol (Toprol XL) two hours prior to test. . FEMALES- please wear underwire-free bra if available  After the Test: . Drink plenty of water. . After receiving IV contrast, you may experience a mild flushed feeling. This is normal. . On occasion, you may experience a mild rash up to 24 hours  after the test. This is not dangerous. If this occurs, you can take Benadryl 25 mg and increase your fluid intake. . If you experience trouble breathing, this can be serious. If it is severe call 911 IMMEDIATELY. If it is mild, please call our office. . If you take any of these medications: Glipizide/Metformin, Avandament, Glucavance, please do not take 48 hours after completing test unless otherwise instructed.   Once we have confirmed authorization from your insurance company, we will call you to set up a date and time for your test. Based on how quickly your insurance processes prior authorizations requests, please allow up to 4 weeks to be contacted for scheduling your Cardiac CT appointment. Be advised that routine Cardiac CT appointments could be scheduled as many as 8 weeks after your provider has ordered it.  For non-scheduling related questions, please contact the cardiac imaging nurse navigator should you have any questions/concerns: Marchia Bond, Cardiac Imaging Nurse Navigator Burley Saver, Interim Cardiac Imaging Nurse [facility] Direct Office Dial: [phone]   For scheduling needs, including cancellations and rescheduling, please call Vivien Rota at [phone].     Follow-Up: At [facility], you and your health needs are our priority.  As part of our continuing mission to provide you with exceptional heart care, we have created designated Provider Care Teams.  These Care Teams include your primary Cardiologist (physician) and Advanced Practice Providers (  APPs -  Physician Assistants and Nurse Practitioners) who all work together to provide you with the care you need, when you need it.  We recommend signing up for the patient portal called "MyChart".  Sign up information is provided on this After Visit Summary.  MyChart is used to connect with patients for Virtual Visits (Telemedicine).  Patients are able to view lab/test results, encounter notes,  upcoming appointments, etc.  Non-urgent messages can be sent to your provider as well.   To learn more about what you can do with MyChart, go to [email or web address].    Your next appointment:   6 month(s)  The format for your next appointment:   In Person  Provider:   Jyl Heinz, MD   Other Instructions Cardiac CT Angiogram A cardiac CT angiogram is a procedure to look at the heart and the area around the heart. It may be done to help find the cause of chest pains or other symptoms of heart disease. During this procedure, a substance called contrast dye is injected into the blood vessels in the area to be checked. A large X-ray machine, called a CT scanner, then takes detailed pictures of the heart and the surrounding area. The procedure is also sometimes called a coronary CT angiogram, coronary artery scanning, or CTA. A cardiac CT angiogram allows the health care provider to see how well blood is flowing to and from the heart. The health care provider will be able to see if there are any problems, such as:  Blockage or narrowing of the coronary arteries in the heart.  Fluid around the heart.  Signs of weakness or disease in the muscles, valves, and tissues of the heart. Tell a health care provider about:  Any allergies you have. This is especially important if you have had a previous allergic reaction to contrast dye.  All medicines you are taking, including vitamins, herbs, eye drops, creams, and over-the-counter medicines.  Any blood disorders you have.  Any surgeries you have had.  Any medical conditions you have.  Whether you are pregnant or may be pregnant.  Any anxiety disorders, chronic pain, or other conditions you have that may increase your stress or prevent you from lying still. What are the risks? Generally, this is a safe procedure. However, problems may occur, including: 1. Bleeding. 2. Infection. 3. Allergic reactions to medicines or dyes. 4. Damage  to other structures or organs. 5. Kidney damage from the contrast dye that is used. 6. Increased risk of cancer from radiation exposure. This risk is low. Talk with your health care provider about: ? The risks and benefits of testing. ? How you can receive the lowest dose of radiation. What happens before the procedure? 1. Wear comfortable clothing and remove any jewelry, glasses, dentures, and hearing aids. 2. Follow instructions from your health care provider about eating and drinking. This may include: ? For 12 hours before the procedure -- avoid caffeine. This includes tea, coffee, soda, energy drinks, and diet pills. Drink plenty of water or other fluids that do not have caffeine in them. Being well hydrated can prevent complications. ? For 4-6 hours before the procedure -- stop eating and drinking. The contrast dye can cause nausea, but this is less likely if your stomach is empty. 3. Ask your health care provider about changing or stopping your regular medicines. This is especially important if you are taking diabetes medicines, blood thinners, or medicines to treat problems with erections (erectile dysfunction). What happens during the  procedure?  1. Hair on your chest may need to be removed so that small sticky patches called electrodes can be placed on your chest. These will transmit information that helps to monitor your heart during the procedure. 2. An IV will be inserted into one of your veins. 3. You might be given a medicine to control your heart rate during the procedure. This will help to ensure that good images are obtained. 4. You will be asked to lie on an exam table. This table will slide in and out of the CT machine during the procedure. 5. Contrast dye will be injected into the IV. You might feel warm, or you may get a metallic taste in your mouth. 6. You will be given a medicine called nitroglycerin. This will relax or dilate the arteries in your heart. 7. The table that  you are lying on will move into the CT machine tunnel for the scan. 8. The person running the machine will give you instructions while the scans are being done. You may be asked to: ? Keep your arms above your head. ? Hold your breath. ? Stay very still, even if the table is moving. 9. When the scanning is complete, you will be moved out of the machine. 10. The IV will be removed. The procedure may vary among health care providers and hospitals. What can I expect after the procedure? After your procedure, it is common to have:  A metallic taste in your mouth from the contrast dye.  A feeling of warmth.  A headache from the nitroglycerin. Follow these instructions at home:  Take over-the-counter and prescription medicines only as told by your health care provider.  If you are told, drink enough fluid to keep your urine pale yellow. This will help to flush the contrast dye out of your body.  Most people can return to their normal activities right after the procedure. Ask your health care provider what activities are safe for you.  It is up to you to get the results of your procedure. Ask your health care provider, or the department that is doing the procedure, when your results will be ready.  Keep all follow-up visits as told by your health care provider. This is important. Contact a health care provider if: 1. You have any symptoms of allergy to the contrast dye. These include: ? Shortness of breath. ? Rash or hives. ? A racing heartbeat. Summary  A cardiac CT angiogram is a procedure to look at the heart and the area around the heart. It may be done to help find the cause of chest pains or other symptoms of heart disease.  During this procedure, a large X-ray machine, called a CT scanner, takes detailed pictures of the heart and the surrounding area after a contrast dye has been injected into blood vessels in the area.  Ask your health care provider about changing or stopping  your regular medicines before the procedure. This is especially important if you are taking diabetes medicines, blood thinners, or medicines to treat erectile dysfunction.  If you are told, drink enough fluid to keep your urine pale yellow. This will help to flush the contrast dye out of your body. This information is not intended to replace advice given to you by your health care provider. Make sure you discuss any questions you have with your health care provider. Document Revised: 05/25/2019 Document Reviewed: 05/25/2019 Elsevier Patient Education  [place].

## 2020-05-26 LAB — BASIC METABOLIC PANEL
BUN/Creatinine Ratio: 16 (ref 12–28)
BUN: 14 mg/dL (ref 8–27)
CO2: 25 mmol/L (ref 20–29)
Calcium: 9.6 mg/dL (ref 8.7–10.3)
Chloride: 100 mmol/L (ref 96–106)
Creatinine, Ser: 0.87 mg/dL (ref 0.57–1.00)
GFR calc Af Amer: 77 mL/min/{1.73_m2} (ref >59)
GFR calc non Af Amer: 67 mL/min/{1.73_m2} (ref >59)
Glucose: 146 mg/dL — ABNORMAL HIGH (ref 65–99)
Potassium: 4.3 mmol/L (ref 3.5–5.2)
Sodium: 139 mmol/L (ref 134–144)

## 2020-05-29 ENCOUNTER — Telehealth (HOSPITAL_COMMUNITY): Payer: Self-pay | Admitting: Emergency Medicine

## 2020-05-29 ENCOUNTER — Other Ambulatory Visit (HOSPITAL_COMMUNITY): Payer: Self-pay | Admitting: Emergency Medicine

## 2020-05-29 DIAGNOSIS — R0602 Shortness of breath: Secondary | ICD-10-CM

## 2020-05-29 NOTE — Telephone Encounter (Signed)
Reaching out to patient to offer assistance regarding upcoming cardiac imaging study; pt verbalizes understanding of appt date/time, parking situation and where to check in, pre-test NPO status and medications ordered, and verified current allergies; name and call back number provided for further questions should they arise Elnor Renovato RN Navigator Cardiac Imaging [facility] [phone] office [phone] cell 

## 2020-05-31 ENCOUNTER — Other Ambulatory Visit: Payer: Self-pay

## 2020-05-31 ENCOUNTER — Ambulatory Visit (HOSPITAL_COMMUNITY)
Admission: RE | Admit: 2020-05-31 | Discharge: 2020-05-31 | Disposition: A | Discharge status: Home / Self Care | Payer: Medicare Other | Source: Ambulatory Visit | Attending: Cardiology | Admitting: Cardiology

## 2020-05-31 DIAGNOSIS — E088 Diabetes mellitus due to underlying condition with unspecified complications: Secondary | ICD-10-CM | POA: Insufficient documentation

## 2020-05-31 DIAGNOSIS — R072 Precordial pain: Secondary | ICD-10-CM

## 2020-05-31 DIAGNOSIS — Z950 Presence of cardiac pacemaker: Secondary | ICD-10-CM | POA: Insufficient documentation

## 2020-05-31 DIAGNOSIS — I480 Paroxysmal atrial fibrillation: Secondary | ICD-10-CM | POA: Diagnosis present

## 2020-05-31 DIAGNOSIS — R0602 Shortness of breath: Secondary | ICD-10-CM | POA: Insufficient documentation

## 2020-05-31 MED ORDER — IOHEXOL 350 MG/ML SOLN
80.0000 mL | Freq: Once | INTRAVENOUS | Status: AC | PRN
  Administered 2020-05-31: 80 mL via INTRAVENOUS

## 2020-05-31 MED ORDER — NITROGLYCERIN 0.4 MG SL SUBL
SUBLINGUAL_TABLET | SUBLINGUAL | Status: AC
  Filled 2020-05-31: qty 2

## 2020-05-31 MED ORDER — METOPROLOL TARTRATE 5 MG/5ML IV SOLN
5.0000 mg | INTRAVENOUS | Status: DC | PRN
  Administered 2020-05-31: 2.5 mg via INTRAVENOUS

## 2020-05-31 MED ORDER — NITROGLYCERIN 0.4 MG SL SUBL
0.8000 mg | SUBLINGUAL_TABLET | Freq: Once | SUBLINGUAL | Status: AC
  Administered 2020-05-31: 0.8 mg via SUBLINGUAL

## 2020-05-31 MED ORDER — METOPROLOL TARTRATE 5 MG/5ML IV SOLN
INTRAVENOUS | Status: AC
  Filled 2020-05-31: qty 5

## 2020-07-16 ENCOUNTER — Ambulatory Visit (INDEPENDENT_AMBULATORY_CARE_PROVIDER_SITE_OTHER): Payer: Medicare Other

## 2020-07-16 DIAGNOSIS — I495 Sick sinus syndrome: Secondary | ICD-10-CM | POA: Diagnosis not present

## 2020-07-17 LAB — CUP PACEART REMOTE DEVICE CHECK
Battery Remaining Longevity: 41 mo
Battery Voltage: 2.97 V
Brady Statistic AP VP Percent: 0.03 %
Brady Statistic AP VS Percent: 87.35 %
Brady Statistic AS VP Percent: 0 %
Brady Statistic AS VS Percent: 12.62 %
Brady Statistic RA Percent Paced: 86.78 %
Brady Statistic RV Percent Paced: 0.03 %
Date Time Interrogation Session: 20211004150638
Implantable Lead Implant Date: 20150114
Implantable Lead Implant Date: 20150114
Implantable Lead Location: 753859
Implantable Lead Location: 753860
Implantable Lead Model: 5076
Implantable Lead Model: 5076
Implantable Pulse Generator Implant Date: 20150114
Lead Channel Impedance Value: 323 Ohm
Lead Channel Impedance Value: 399 Ohm
Lead Channel Impedance Value: 437 Ohm
Lead Channel Impedance Value: 513 Ohm
Lead Channel Pacing Threshold Amplitude: 0.875 V
Lead Channel Pacing Threshold Amplitude: 0.875 V
Lead Channel Pacing Threshold Pulse Width: 0.4 ms
Lead Channel Pacing Threshold Pulse Width: 0.4 ms
Lead Channel Sensing Intrinsic Amplitude: 1.625 mV
Lead Channel Sensing Intrinsic Amplitude: 1.625 mV
Lead Channel Sensing Intrinsic Amplitude: 16.875 mV
Lead Channel Sensing Intrinsic Amplitude: 16.875 mV
Lead Channel Setting Pacing Amplitude: 1.75 V
Lead Channel Setting Pacing Amplitude: 2 V
Lead Channel Setting Pacing Pulse Width: 0.4 ms
Lead Channel Setting Sensing Sensitivity: 0.9 mV

## 2020-07-19 NOTE — Progress Notes (Signed)
Remote pacemaker transmission.   

## 2020-08-16 ENCOUNTER — Other Ambulatory Visit: Payer: Self-pay

## 2020-08-16 ENCOUNTER — Ambulatory Visit (INDEPENDENT_AMBULATORY_CARE_PROVIDER_SITE_OTHER): Payer: Medicare Other | Admitting: Cardiology

## 2020-08-16 ENCOUNTER — Encounter: Payer: Self-pay | Admitting: Cardiology

## 2020-08-16 VITALS — BP 140/94 | HR 81 | Ht 64.0 in | Wt 223.2 lb

## 2020-08-16 DIAGNOSIS — I495 Sick sinus syndrome: Secondary | ICD-10-CM | POA: Diagnosis not present

## 2020-08-16 NOTE — Patient Instructions (Addendum)
Medication Instructions:  Your physician recommends that you continue on your current medications as directed. Please refer to the Current Medication list given to you today.  *If you need a refill on your cardiac medications before your next appointment, please call your pharmacy*   Lab Work: None ordered   Testing/Procedures: None ordered   Follow-Up: At [facility], you and your health needs are our priority.  As part of our continuing mission to provide you with exceptional heart care, we have created designated Provider Care Teams.  These Care Teams include your primary Cardiologist (physician) and Advanced Practice Providers (APPs -  Physician Assistants and Nurse Practitioners) who all work together to provide you with the care you need, when you need it.   Remote monitoring is used to monitor your Pacemaker or ICD from home. This monitoring reduces the number of office visits required to check your device to one time per year. It allows [place] to keep an eye on the functioning of your device to ensure it is working properly. You are scheduled for a device check from home on 10/16/20. You may send your transmission at any time that day. If you have a wireless device, the transmission Amarah Brossman be sent automatically. After your physician reviews your transmission, you Ellenor Wisniewski receive a postcard with your next transmission date.  Your next appointment:   1 year(s)  The format for your next appointment:   In Person  Provider:   Loman Brooklyn, MD   Thank you for choosing [facility]!!   Dory Horn, RN [phone]    Other Instructions

## 2020-08-16 NOTE — Progress Notes (Signed)
Electrophysiology Office Note   Date:  08/16/2020   ID:  Brooke, Pitts 06-05-1946, MRN [id number]  PCP:  Mendel Ryder, PA-C  Cardiologist:  Revankar Primary Electrophysiologist:  Regan Lemming, MD    No chief complaint on file.    History of Present Illness: Brooke Pitts is a 74 y.o. female who is being seen today for the evaluation of atrial fibrillation at the request of Mendel Ryder, [place]. Presenting today for electrophysiology evaluation.  She has a history of hypertension, hyperlipidemia, and she had a pacemaker inserted at [facility].  She also has atrial fibrillation and is on Coumadin for a prior stroke.    Today, denies symptoms of palpitations, chest pain, shortness of breath, orthopnea, PND, lower extremity edema, claudication, dizziness, presyncope, syncope, bleeding, or neurologic sequela. The patient is tolerating medications without difficulties.  Since last being seen she has done well.  She continues to not have any major issues.  She is able to do all her daily activities.     Past Medical History:  Diagnosis Date  . A-fib (HCC)   . BPPV (benign paroxysmal positional vertigo) 11/17/2015  . CPAP/BiPAP dependence 11/17/2015  . Crohn's disease of ileum (HCC) 11/17/2015  . Diabetes mellitus due to underlying condition with unspecified complications (HCC) 03/04/2019  . Diabetes mellitus without complication (HCC)   . 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 Essential hypertension 10/03/2015  . GERD (gastroesophageal reflux disease) 11/03/2015  . History of cataract 11/17/2015  . History of CVA (cerebrovascular accident) 10/18/2015   Overview:  May 2014 or 2015  Formatting of this note might be different from the original. Overview:  Overview:  May 2014 or 2015 Formatting of this note might be different from the original. May 2014 or 2015  . History of osteopenia 11/17/2015  . Hyperlipidemia LDL goal <100 10/03/2015   Overview:  Multi statin  intolerant  Formatting of this note might be different from the original. Overview:  Overview:  Multi statin intolerant Formatting of this note might be different from the original. Multi statin intolerant. Statin intolerance Edd Reppert observe. No need to draw lipid panel in future.  . Hypertension   . Hypokalemia 06/30/2016  . Localized edema 09/20/2018  . Long term current use of anticoagulant therapy 10/18/2015  . Low magnesium level 07/08/2016  . NSVT (nonsustained ventricular tachycardia) (HCC) 01/27/2019  . Obesity (BMI 30.0-34.9) 09/18/2016  . OSA on CPAP 11/17/2015  . Pacemaker reprogramming/check 11/20/2015  . PAF (paroxysmal atrial fibrillation) (HCC) 01/27/2018  . Preoperative cardiovascular examination 08/10/2019  . 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 Risk for falls 08/21/2016  . S/P placement of cardiac pacemaker 11/17/2015  . SSS (sick sinus syndrome) (HCC) 11/20/2015  . Statin intolerance 09/20/2018  . Stroke (HCC)   . Type 2 diabetes mellitus (HCC) 11/02/2015  . Vitamin D deficiency 12/03/2015   Past Surgical History:  Procedure Laterality Date  . ABDOMINAL HYSTERECTOMY    . BILATERAL OOPHORECTOMY    . CHOLECYSTECTOMY    . PACEMAKER INSERTION    . TONSILLECTOMY       Current Outpatient Medications  Medication Sig Dispense Refill  . apixaban (ELIQUIS) 5 MG TABS tablet Take 1 tablet (5 mg total) by mouth 2 (two) times daily. 180 tablet 3  . atenolol (TENORMIN) 50 MG tablet TAKE 1 & 1/2 TABLETS BY MOUTH TWICE DAILY (Patient taking differently: 50 mg 2 (two) times daily. ) 270 tablet 1  . b complex vitamins  tablet Take 1 tablet by mouth daily.    Marland Kitchen dimenhyDRINATE (DRAMAMINE) 50 MG tablet Take 50 mg by mouth every 8 (eight) hours as needed.    . irbesartan (AVAPRO) 300 MG tablet Take 300 mg by mouth daily.    Marland Kitchen loratadine (CLARITIN) 10 MG tablet Take 10 mg by mouth daily as needed for allergies or rhinitis.    . magnesium oxide (MAG-OX) 400 MG tablet Take 400 mg by mouth daily.    .  metFORMIN (GLUCOPHAGE) 500 MG tablet Take 500 mg by mouth. TAKE 1 (500MG )  TABLET AM AND 2 (1000MG ) TABLETS PM    . Omega-3 Fatty Acids (FISH OIL) 1200 MG CPDR Take 1 capsule by mouth daily.     DELICA LANCETS 33G MISC     . ONETOUCH VERIO test strip     . nitroGLYCERIN (NITROSTAT) 0.4 MG SL tablet Place 1 tablet (0.4 mg total) under the tongue every 5 (five) minutes as needed for chest pain. 30 tablet 6   No current facility-administered medications for this visit.    Allergies:   Flecainide, Cymbalta [duloxetine hcl], Hydrocodone, Lisinopril, Amlodipine, Duloxetine, Losartan, and Nsaids   Social History:  The patient  reports that she has never smoked. She has never used smokeless tobacco. She reports that she does not drink alcohol and does not use drugs.   Family History:  The patient's family history includes Arrhythmia in her mother; Atrial fibrillation in her mother; Heart disease in her brother; Heart failure in her brother.    ROS:  Please see the history of present illness.   Otherwise, review of systems is positive for none.   All other systems are reviewed and negative.   PHYSICAL EXAM: VS:  BP (!) 140/94   Pulse 81   Ht 5\' 4"  (1.626 m)   Wt 223 lb 3.2 oz (101.2 kg)   SpO2 99%   BMI 38.31 kg/m  , BMI Body mass index is 38.31 kg/m. GEN: Well nourished, well developed, in no acute distress  HEENT: normal  Neck: no JVD, carotid bruits, or masses Cardiac: RRR; no murmurs, rubs, or gallops,no edema  Respiratory:  clear to auscultation bilaterally, normal work of breathing GI: soft, nontender, nondistended, + BS MS: no deformity or atrophy  Skin: warm and dry, device site well healed Neuro:  Strength and sensation are intact Psych: euthymic mood, full affect  EKG:  EKG is ordered today. Personal review of the ekg ordered shows have a conversation atrial paced, rate 70.  Personal review of the device interrogation today. Results in [facility]    Recent  Labs: 04/18/2020: B Natriuretic Peptide 36.6; Hemoglobin 13.1; Magnesium 1.7; Platelets 246 05/25/2020: BUN 14; Creatinine, Ser 0.87; Potassium 4.3; Sodium 139    Lipid Panel     Component Value Date/Time   CHOL 181 07/05/2019 1011   TRIG 166 (H) 07/05/2019 1011   HDL 55 07/05/2019 1011   CHOLHDL 3.3 07/05/2019 1011   LDLCALC 97 07/05/2019 1011     Wt Readings from Last 3 Encounters:  08/16/20 223 lb 3.2 oz (101.2 kg)  05/21/20 223 lb (101.2 kg)  04/20/20 225 lb 12.8 oz (102.4 kg)      Other studies Reviewed: Additional studies/ records that were reviewed today include: TTE 9/*13/18  Review of the above records today demonstrates:  - Left ventricle: The cavity size was normal. Wall thickness was   normal. Systolic function was normal. The estimated ejection   fraction was in the range  of 55% to 60%. Doppler parameters are   consistent with abnormal left ventricular relaxation (grade 1   diastolic dysfunction). - Aortic valve: There was trivial regurgitation. - Atrial septum: No defect or patent foramen ovale was identified.  Myoview 9/*13/18  Nuclear stress EF: 65%.  This is a low risk study.  The left ventricular ejection fraction is normal (55-65%).  Normal study. No evidence of ischemia or previous infarction .   ASSESSMENT AND PLAN:  1.  Paroxysmal atrial fibrillation: Currently on Coumadin for anticoagulation.  CHA2DS2-VASc of 3.  Minimal atrial fibrillation noted on device interrogation.  No changes.  2.  Hypertension: Mildly elevated today.  She says it is usually well controlled and feels that it is elevated as she is in the doctor's office.  No changes.  3.  Sick sinus syndrome: Status post Medtronic dual-chamber pacemaker.  Device functioning appropriately.  No changes at this time.    Current medicines are reviewed at length with the patient today.   The patient does not have concerns regarding her medicines.  The following changes were made today:  None  Labs/ tests ordered today include:  Orders Placed This Encounter  Procedures  . EKG 12-Lead     Disposition:   FU with Geovanie Winnett 1 year  Signed, Kenichi Cassada Jorja Loa, MD  08/16/2020 2:05 PM     [facility] [address] [place] [address] [phone] (office) [phone] (fax)

## 2020-08-21 IMAGING — CT CT HEART MORP W/ CTA COR W/ SCORE W/ CA W/CM &/OR W/O CM
3 of 9 series · 6 of 20 positions shown, 7 images · IV contrast (APPLIED)
Comparison: None.
COMPARISON: None.

Addendum:
EXAM:
OVER-READ INTERPRETATION  CT CHEST

The following report is an over-read performed by radiologist Dr.
Jaque Barraza [[email or web address]] on 05/31/2020. This
over-read does not include interpretation of cardiac or coronary
anatomy or pathology. The coronary calcium score/coronary CTA
interpretation by the cardiologist is attached.
CLINICAL DATA: 73 year old female with shortness of breath. Medical
history includes hyperlipidemia, CVA and Diabetes Mellitus.
Cardiac/Coronary  CT
TECHNIQUE: The patient was scanned on a Phillips Force scanner.

[Series 8: best syst 46 % · axial · 0.38mm/px · z∈[+1097,+1138]mm · 2 of 306 slices shown, 3 images]
[im 102/306  vessel]
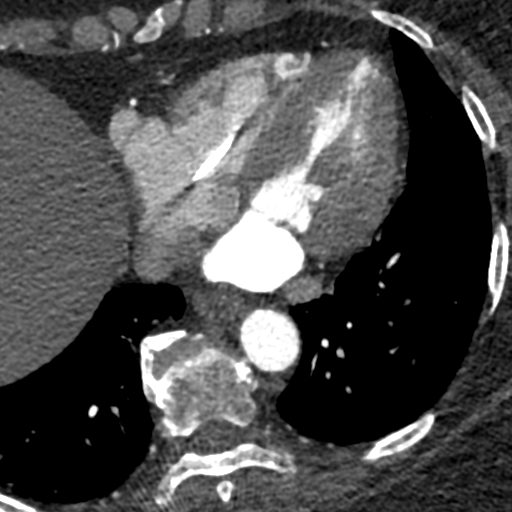
[im 102/306  lung]
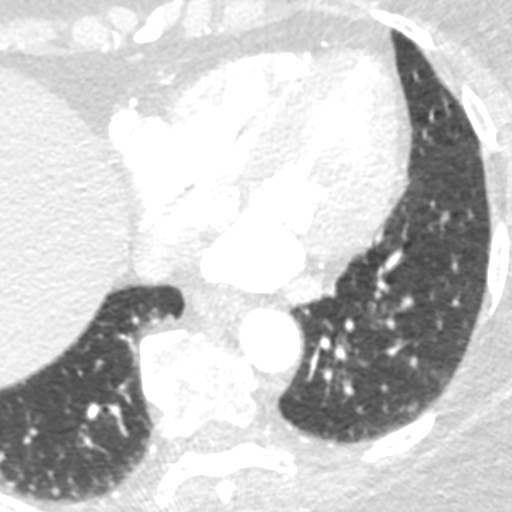
[im 204/306  vessel]
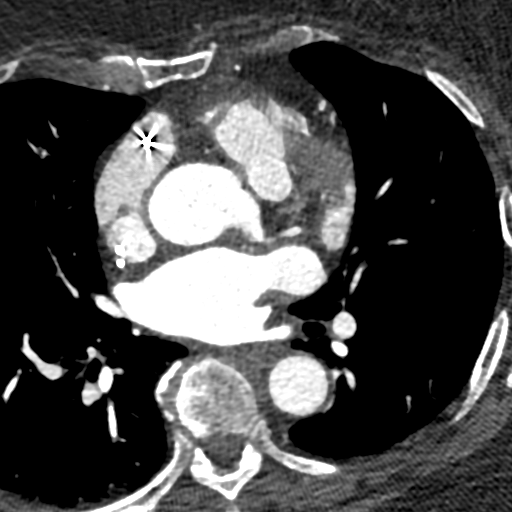

[Series 9: ts diast sharp 65 % · axial · 0.38mm/px · z∈[+1097,+1138]mm · 2 of 306 slices shown]
[im 102/306  lung]
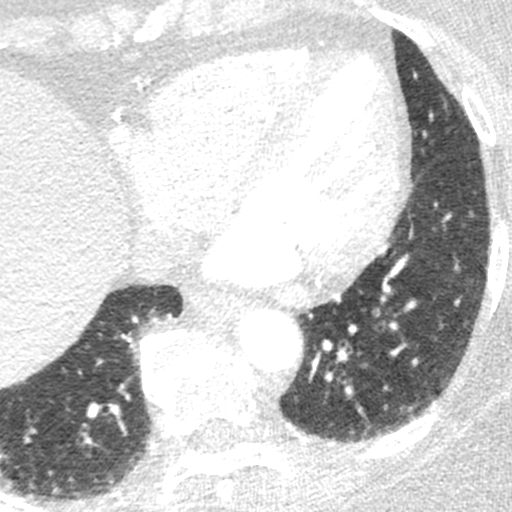
[im 204/306  lung]
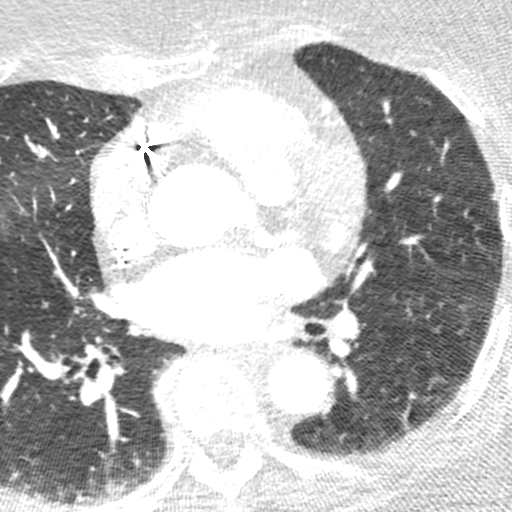

[Series 10: ts syst sharp 46 % · axial · 0.38mm/px · z∈[+1097,+1138]mm · 2 of 306 slices shown]
[im 102/306  lung]
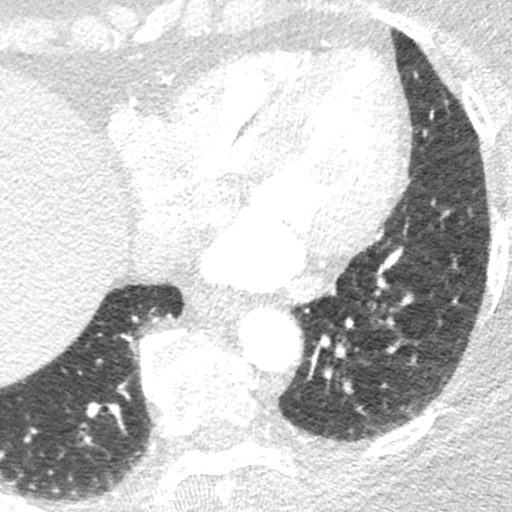
[im 204/306  lung]
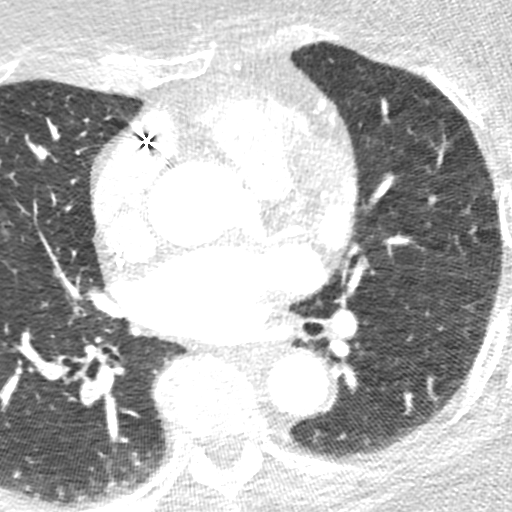

[6 of 20 positions shown; findings below may reference images not displayed]

FINDINGS: Extracardiac findings will be described separately under dictation
for contemporaneously obtained chest CT.
IMPRESSION: Please see separate dictation for contemporaneously obtained chest
CT 05/31/2020 for full description of relevant extracardiac
findings.
FINDINGS: A 120 kV prospective scan was triggered in the descending thoracic
aorta at 111 HU's. Axial non-contrast 3 mm slices were carried out
through the heart. The data set was analyzed on a dedicated work
station and scored using the Agatson method. Gantry rotation speed
was 250 msecs and collimation was .6 mm. No beta blockade and 0.8 mg
of sl NTG was given. The 3D data set was reconstructed in 5%
intervals of the 67-82 % of the R-R cycle. Diastolic phases were
analyzed on a dedicated work station using MPR, MIP and VRT modes.
The patient received 80 cc of contrast.

Aorta: Normal size.  No calcifications.  No dissection.

Aortic Valve:  Trileaflet.  No calcifications.

Coronary Arteries:  Normal coronary origin.  Right dominance.

RCA is a large dominant artery that gives rise to PDA and PLVB.
There is no plaque.

Left main is a large artery that gives rise to LAD and LCX arteries.

LAD is a large vessel that has no plaque.

LCX is a non-dominant artery that gives rise to one large OM1
branch. There is no plaque.

Other findings:

Normal pulmonary vein drainage into the left atrium.

Normal left atrial appendage without a thrombus.

Normal size of the pulmonary artery.

Pacemaker and pacemaker wire noted.
IMPRESSION: 1. Coronary calcium score of 0. This was 0 percentile for age and
sex matched control.

2. Normal coronary origin with right dominance.

3. No evidence of CAD.

Macho Tiger, DO

*** End of Addendum ***
EXAM:
OVER-READ INTERPRETATION  CT CHEST

The following report is an over-read performed by radiologist Dr.
Jaque Barraza [REDACTED] on 05/31/2020. This
over-read does not include interpretation of cardiac or coronary
anatomy or pathology. The coronary calcium score/coronary CTA
interpretation by the cardiologist is attached.
FINDINGS: Extracardiac findings will be described separately under dictation
for contemporaneously obtained chest CT.
IMPRESSION: Please see separate dictation for contemporaneously obtained chest
CT 05/31/2020 for full description of relevant extracardiac
findings.

## 2020-10-16 ENCOUNTER — Ambulatory Visit (INDEPENDENT_AMBULATORY_CARE_PROVIDER_SITE_OTHER): Payer: Medicare Other

## 2020-10-16 DIAGNOSIS — I495 Sick sinus syndrome: Secondary | ICD-10-CM | POA: Diagnosis not present

## 2020-10-16 LAB — CUP PACEART REMOTE DEVICE CHECK
Battery Remaining Longevity: 36 mo
Battery Voltage: 2.96 V
Brady Statistic AP VP Percent: 0.08 %
Brady Statistic AP VS Percent: 84.65 %
Brady Statistic AS VP Percent: 0 %
Brady Statistic AS VS Percent: 15.26 %
Brady Statistic RA Percent Paced: 83.77 %
Brady Statistic RV Percent Paced: 0.09 %
Date Time Interrogation Session: 20220104095805
Implantable Lead Implant Date: 20150114
Implantable Lead Implant Date: 20150114
Implantable Lead Location: 753859
Implantable Lead Location: 753860
Implantable Lead Model: 5076
Implantable Lead Model: 5076
Implantable Pulse Generator Implant Date: 20150114
Lead Channel Impedance Value: 323 Ohm
Lead Channel Impedance Value: 361 Ohm
Lead Channel Impedance Value: 399 Ohm
Lead Channel Impedance Value: 532 Ohm
Lead Channel Pacing Threshold Amplitude: 0.75 V
Lead Channel Pacing Threshold Amplitude: 0.75 V
Lead Channel Pacing Threshold Pulse Width: 0.4 ms
Lead Channel Pacing Threshold Pulse Width: 0.4 ms
Lead Channel Sensing Intrinsic Amplitude: 1.75 mV
Lead Channel Sensing Intrinsic Amplitude: 1.75 mV
Lead Channel Sensing Intrinsic Amplitude: 18.375 mV
Lead Channel Sensing Intrinsic Amplitude: 18.375 mV
Lead Channel Setting Pacing Amplitude: 1.5 V
Lead Channel Setting Pacing Amplitude: 2 V
Lead Channel Setting Pacing Pulse Width: 0.4 ms
Lead Channel Setting Sensing Sensitivity: 0.9 mV

## 2020-10-31 NOTE — Progress Notes (Signed)
Remote pacemaker transmission.   

## 2020-11-27 ENCOUNTER — Encounter: Payer: Self-pay | Admitting: Cardiology

## 2020-11-27 ENCOUNTER — Other Ambulatory Visit: Payer: Self-pay

## 2020-11-27 ENCOUNTER — Ambulatory Visit (INDEPENDENT_AMBULATORY_CARE_PROVIDER_SITE_OTHER): Payer: Medicare Other | Admitting: Cardiology

## 2020-11-27 VITALS — BP 158/98 | HR 80 | Ht 64.75 in | Wt 221.8 lb

## 2020-11-27 DIAGNOSIS — E088 Diabetes mellitus due to underlying condition with unspecified complications: Secondary | ICD-10-CM

## 2020-11-27 DIAGNOSIS — I495 Sick sinus syndrome: Secondary | ICD-10-CM | POA: Diagnosis not present

## 2020-11-27 DIAGNOSIS — I10 Essential (primary) hypertension: Secondary | ICD-10-CM | POA: Diagnosis not present

## 2020-11-27 DIAGNOSIS — I480 Paroxysmal atrial fibrillation: Secondary | ICD-10-CM | POA: Diagnosis not present

## 2020-11-27 DIAGNOSIS — Z950 Presence of cardiac pacemaker: Secondary | ICD-10-CM | POA: Diagnosis not present

## 2020-11-27 DIAGNOSIS — E785 Hyperlipidemia, unspecified: Secondary | ICD-10-CM

## 2020-11-27 MED ORDER — APIXABAN 5 MG PO TABS: 5.0000 mg | ORAL_TABLET | Freq: Two times a day (BID) | ORAL | 3 refills | Status: DC

## 2020-11-27 MED ORDER — METOPROLOL SUCCINATE ER 100 MG PO TB24: 100.0000 mg | ORAL_TABLET | Freq: Every day | ORAL | 3 refills | Status: DC

## 2020-11-27 NOTE — Progress Notes (Signed)
Cardiology Office Note:    Date:  11/27/2020   ID:  Brooke Pitts, DOB 1946-01-20, MRN [id number]  PCP:  Mendel Ryder, PA-C  Cardiologist:  Garwin Brothers, MD   Referring MD: Mendel Ryder, PA-C    ASSESSMENT:    1. Essential hypertension   2. PAF (paroxysmal atrial fibrillation) (HCC)   3. SSS (sick sinus syndrome) (HCC)   4. S/P placement of cardiac pacemaker   5. Hyperlipidemia LDL goal <100   6. Diabetes mellitus due to underlying condition with unspecified complications (HCC)    PLAN:    In order of problems listed above:  1. Primary prevention stressed with the patient.  Importance of compliance with diet medication stressed and she vocalized understanding.  She was advised to walk at least half an hour a day 5 days a week and she promises to do so. 2. Paroxysmal atrial fibrillation: Post stroke:I discussed with the patient atrial fibrillation, disease process. Management and therapy including rate and rhythm control, anticoagulation benefits and potential risks were discussed extensively with the patient. Patient had multiple questions which were answered to patient's satisfaction. 3. S/p permanent pacemaker: Managed by our electrophysiology colleagues and stable. 4. Diabetes mellitus: Diet was emphasized to the patient. 5. Obesity: Weight reduction was stressed and risks of obesity explained and she promises to do better. 6. Mixed dyslipidemia: Intolerant to lipid medications and not keen on trying those medications.  She wants to try to do this by herself.  Her diet.  I respect her wishes. 7. Patient will be seen in follow-up appointment in 4 weeks or earlier if the patient has any concerns    Medication Adjustments/Labs and Tests Ordered: Current medicines are reviewed at length with the patient today.  Concerns regarding medicines are outlined above.  Orders Placed This Encounter  Procedures  . Basic metabolic panel   Meds ordered this encounter  Medications   . apixaban (ELIQUIS) 5 MG TABS tablet    Sig: Take 1 tablet (5 mg total) by mouth 2 (two) times daily.    Dispense:  180 tablet    Refill:  3  . metoprolol succinate (TOPROL-XL) 100 MG 24 hr tablet    Sig: Take 1 tablet (100 mg total) by mouth daily. Take with or immediately following a meal.    Dispense:  90 tablet    Refill:  3     No chief complaint on file.    History of Present Illness:    Brooke Pitts is a 75 y.o. female.  Patient has past medical history of paroxysmal atrial fibrillation, essential hypertension dyslipidemia and obesity.  She is post permanent pacemaker.  She denies any problems at this time and takes care of activities of daily living.  No chest pain orthopnea or PND.  At the time of my evaluation, the patient is alert awake oriented and in no distress.  She walks at least half an hour a day on a daily basis.  She mentions to me that her blood pressure is elevated at home just like it is today.  She is back on a diuretic by her primary care provider.  Past Medical History:  Diagnosis Date  . A-fib (HCC)   . BPPV (benign paroxysmal positional vertigo) 11/17/2015  . CPAP/BiPAP dependence 11/17/2015  . Crohn's disease of ileum (HCC) 11/17/2015  . Diabetes mellitus due to underlying condition with unspecified complications (HCC) 03/04/2019  . Diabetes mellitus without complication (HCC)   . 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  Essential hypertension 10/03/2015  . GERD (gastroesophageal reflux disease) 11/03/2015  . History of cataract 11/17/2015  . History of CVA (cerebrovascular accident) 10/18/2015   Overview:  May 2014 or 2015  Formatting of this note might be different from the original. Overview:  Overview:  May 2014 or 2015 Formatting of this note might be different from the original. May 2014 or 2015  . History of osteopenia 11/17/2015  . Hyperlipidemia LDL goal <100 10/03/2015   Overview:  Multi statin intolerant  Formatting of this note might be different  from the original. Overview:  Overview:  Multi statin intolerant Formatting of this note might be different from the original. Multi statin intolerant. Statin intolerance will observe. No need to draw lipid panel in future.  . Hypertension   . Hypokalemia 06/30/2016  . Localized edema 09/20/2018  . Long term current use of anticoagulant therapy 10/18/2015  . Low magnesium level 07/08/2016  . NSVT (nonsustained ventricular tachycardia) (HCC) 01/27/2019  . Obesity (BMI 30.0-34.9) 09/18/2016  . OSA on CPAP 11/17/2015  . Pacemaker reprogramming/check 11/20/2015  . PAF (paroxysmal atrial fibrillation) (HCC) 01/27/2018  . Preoperative cardiovascular examination 08/10/2019  . 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 Risk for falls 08/21/2016  . S/P placement of cardiac pacemaker 11/17/2015  . SSS (sick sinus syndrome) (HCC) 11/20/2015  . Statin intolerance 09/20/2018  . Stroke (HCC)   . Type 2 diabetes mellitus (HCC) 11/02/2015  . Vitamin D deficiency 12/03/2015    Past Surgical History:  Procedure Laterality Date  . ABDOMINAL HYSTERECTOMY    . BILATERAL OOPHORECTOMY    . CHOLECYSTECTOMY    . PACEMAKER INSERTION    . TONSILLECTOMY      Current Medications: Current Meds  Medication Sig  . b complex vitamins tablet Take 1 tablet by mouth daily.  Marland Kitchen dimenhyDRINATE (DRAMAMINE) 50 MG tablet Take 50 mg by mouth every 8 (eight) hours as needed for dizziness.  . irbesartan (AVAPRO) 300 MG tablet Take 300 mg by mouth daily.  Marland Kitchen loratadine (CLARITIN) 10 MG tablet Take 10 mg by mouth daily as needed for allergies or rhinitis.  . magnesium oxide (MAG-OX) 400 MG tablet Take 400 mg by mouth daily.  . metFORMIN (GLUCOPHAGE-XR) 500 MG 24 hr tablet Take 500 mg by mouth 3 (three) times daily. Patient takes 500 mg in the morning and 1000 mg in the evening  . metoprolol succinate (TOPROL-XL) 100 MG 24 hr tablet Take 1 tablet (100 mg total) by mouth daily. Take with or immediately following a meal.  . Omega-3 Fatty Acids  (FISH OIL) 1200 MG CPDR Take 1 capsule by mouth daily.   . [DISCONTINUED] apixaban (ELIQUIS) 5 MG TABS tablet Take 1 tablet (5 mg total) by mouth 2 (two) times daily.  . [DISCONTINUED] atenolol (TENORMIN) 50 MG tablet TAKE 1 & 1/2 TABLETS BY MOUTH TWICE DAILY     Allergies:   Flecainide, Cymbalta [duloxetine hcl], Hydrocodone, Lisinopril, Amlodipine, Duloxetine, Losartan, and Nsaids   Social History   Socioeconomic History  . Marital status: Married    Spouse name: Not on file  . Number of children: Not on file  . Years of education: Not on file  . Highest education level: Not on file  Occupational History  . Not on file  Tobacco Use  . Smoking status: Never Smoker  . Smokeless tobacco: Never Used  Vaping Use  . Vaping Use: Never used  Substance and Sexual Activity  . Alcohol use: No  . Drug use: No  .  Sexual activity: Not on file  Other Topics Concern  . Not on file  Social History Narrative  . Not on file   Social Determinants of Health   Financial Resource Strain: Not on file  Food Insecurity: Not on file  Transportation Needs: Not on file  Physical Activity: Not on file  Stress: Not on file  Social Connections: Not on file     Family History: The patient's family history includes Arrhythmia in her mother; Atrial fibrillation in her mother; Heart disease in her brother; Heart failure in her brother.  ROS:   Please see the history of present illness.    All other systems reviewed and are negative.  EKGs/Labs/Other Studies Reviewed:    The following studies were reviewed today: I discussed my findings with the patient at extensive length   Recent Labs: 04/18/2020: B Natriuretic Peptide 36.6; Hemoglobin 13.1; Magnesium 1.7; Platelets 246 05/25/2020: BUN 14; Creatinine, Ser 0.87; Potassium 4.3; Sodium 139  Recent Lipid Panel    Component Value Date/Time   CHOL 181 07/05/2019 1011   TRIG 166 (H) 07/05/2019 1011   HDL 55 07/05/2019 1011   CHOLHDL 3.3  07/05/2019 1011   LDLCALC 97 07/05/2019 1011    Physical Exam:    VS:  BP (!) 158/98   Pulse 80   Ht 5' 4.75" (1.645 m)   Wt 221 lb 12.8 oz (100.6 kg)   SpO2 100%   BMI 37.20 kg/m     Wt Readings from Last 3 Encounters:  11/27/20 221 lb 12.8 oz (100.6 kg)  08/16/20 223 lb 3.2 oz (101.2 kg)  05/21/20 223 lb (101.2 kg)     GEN: Patient is in no acute distress HEENT: Normal NECK: No JVD; No carotid bruits LYMPHATICS: No lymphadenopathy CARDIAC: Hear sounds regular, 2/6 systolic murmur at the apex. RESPIRATORY:  Clear to auscultation without rales, wheezing or rhonchi  ABDOMEN: Soft, non-tender, non-distended MUSCULOSKELETAL:  No edema; No deformity  SKIN: Warm and dry NEUROLOGIC:  Alert and oriented x 3 PSYCHIATRIC:  Normal affect   Signed, Garwin Brothers, MD  11/27/2020 2:24 PM    [facility]

## 2020-11-27 NOTE — Patient Instructions (Signed)
Medication Instructions:  Your physician has recommended you make the following change in your medication:   Stop Atenolol  Start Metoprolol XL 100 mg daily  *If you need a refill on your cardiac medications before your next appointment, please call your pharmacy*   Lab Work: None ordered If you have labs (blood work) drawn today and your tests are completely normal, you will receive your results only by: Marland Kitchen MyChart Message (if you have MyChart) OR . A paper copy in the mail If you have any lab test that is abnormal or we need to change your treatment, we will call you to review the results.   Testing/Procedures: None ordered   Follow-Up: At [facility], you and your health needs are our priority.  As part of our continuing mission to provide you with exceptional heart care, we have created designated Provider Care Teams.  These Care Teams include your primary Cardiologist (physician) and Advanced Practice Providers (APPs -  Physician Assistants and Nurse Practitioners) who all work together to provide you with the care you need, when you need it.  We recommend signing up for the patient portal called "MyChart".  Sign up information is provided on this After Visit Summary.  MyChart is used to connect with patients for Virtual Visits (Telemedicine).  Patients are able to view lab/test results, encounter notes, upcoming appointments, etc.  Non-urgent messages can be sent to your provider as well.   To learn more about what you can do with MyChart, go to [email or web address].    Your next appointment:   1 month(s)  The format for your next appointment:   In Person  Provider:   Belva Crome, MD   Other Instructions Metoprolol Extended-Release Tablets What is this medicine? METOPROLOL (me TOE proe lole) is a beta blocker. It decreases the amount of work your heart has to do and helps your heart beat regularly. It treats high blood pressure and/or prevent chest pain (also  called angina). It also treats heart failure. This medicine may be used for other purposes; ask your health care provider or pharmacist if you have questions. COMMON BRAND NAME(S): toprol, Toprol XL What should I tell my health care provider before I take this medicine? They need to know if you have any of these conditions:  diabetes  heart or vessel disease like slow heart rate, worsening heart failure, heart block, sick sinus syndrome or Raynaud's disease  kidney disease  liver disease  lung or breathing disease, like asthma or emphysema  pheochromocytoma  thyroid disease  an unusual or allergic reaction to metoprolol, other beta-blockers, medicines, foods, dyes, or preservatives  pregnant or trying to get pregnant  breast-feeding How should I use this medicine? Take this drug by mouth. Take it as directed on the prescription label at the same time every day. Take it with food. You may cut the tablet in half if it is scored (has a line in the middle of it). This may help you swallow the tablet if the whole tablet is too big. Be sure to take both halves. Do not take just one-half of the tablet. Keep taking it unless your health care provider tells you to stop. Talk to your health care provider about the use of this drug in children. While it may be prescribed for children as young as 6 for selected conditions, precautions do apply. Overdosage: If you think you have taken too much of this medicine contact a poison control center or emergency room at once. NOTE:  This medicine is only for you. Do not share this medicine with others. What if I miss a dose? If you miss a dose, take it as soon as you can. If it is almost time for your next dose, take only that dose. Do not take double or extra doses. What may interact with this medicine? This medicine may interact with the following medications:  certain medicines for blood pressure, heart disease, irregular heart beat  certain  medicines for depression, like monoamine oxidase (MAO) inhibitors, fluoxetine, or paroxetine  clonidine  dobutamine  epinephrine  isoproterenol  reserpine This list may not describe all possible interactions. Give your health care provider a list of all the medicines, herbs, non-prescription drugs, or dietary supplements you use. Also tell them if you smoke, drink alcohol, or use illegal drugs. Some items may interact with your medicine. What should I watch for while using this medicine? Visit your doctor or health care professional for regular check ups. Contact your doctor right away if your symptoms worsen. Check your blood pressure and pulse rate regularly. Ask your health care professional what your blood pressure and pulse rate should be, and when you should contact them. You may get drowsy or dizzy. Do not drive, use machinery, or do anything that needs mental alertness until you know how this medicine affects you. Do not sit or stand up quickly, especially if you are an older patient. This reduces the risk of dizzy or fainting spells. Contact your doctor if these symptoms continue. Alcohol may interfere with the effect of this medicine. Avoid alcoholic drinks. This medicine may increase blood sugar. Ask your healthcare provider if changes in diet or medicines are needed if you have diabetes. What side effects may I notice from receiving this medicine? Side effects that you should report to your doctor or health care professional as soon as possible:  allergic reactions like skin rash, itching or hives  cold or numb hands or feet  depression  difficulty breathing  faint  fever with sore throat  irregular heartbeat, chest pain  rapid weight gain  signs and symptoms of high blood sugar such as being more thirsty or hungry or having to urinate more than normal. You may also feel very tired or have blurry vision.  swollen legs or ankles Side effects that usually do not  require medical attention (report to your doctor or health care professional if they continue or are bothersome):  anxiety or nervousness  change in sex drive or performance  dry skin  headache  nightmares or trouble sleeping  short term memory loss  stomach upset or diarrhea This list may not describe all possible side effects. Call your doctor for medical advice about side effects. You may report side effects to FDA at 1-800-FDA-1088. Where should I keep my medicine? Keep out of the reach of children and pets. Store at room temperature between 20 and 25 degrees C (68 and 77 degrees F). Throw away any unused drug after the expiration date. NOTE: This sheet is a summary. It may not cover all possible information. If you have questions about this medicine, talk to your doctor, pharmacist, or health care provider.  2021 Elsevier/Gold Standard (2019-05-12 18:23:00)

## 2020-11-28 LAB — BASIC METABOLIC PANEL
BUN/Creatinine Ratio: 18 (ref 12–28)
BUN: 12 mg/dL (ref 8–27)
CO2: 25 mmol/L (ref 20–29)
Calcium: 9.4 mg/dL (ref 8.7–10.3)
Chloride: 101 mmol/L (ref 96–106)
Creatinine, Ser: 0.67 mg/dL (ref 0.57–1.00)
GFR calc Af Amer: 101 mL/min/{1.73_m2} (ref >59)
GFR calc non Af Amer: 87 mL/min/{1.73_m2} (ref >59)
Glucose: 121 mg/dL — ABNORMAL HIGH (ref 65–99)
Potassium: 4 mmol/L (ref 3.5–5.2)
Sodium: 142 mmol/L (ref 134–144)

## 2020-12-03 ENCOUNTER — Telehealth: Payer: Self-pay

## 2020-12-03 MED ORDER — ATENOLOL 50 MG PO TABS
75.0000 mg | ORAL_TABLET | Freq: Two times a day (BID) | ORAL | 3 refills | Status: DC
Start: 2020-12-03 — End: 2021-06-03

## 2020-12-03 NOTE — Telephone Encounter (Signed)
Spoke with Dr. Tomie China and advised him that the metoprolol was causing the pt dizziness and that she was ok on the atenolol. Pt will stay on the atenolol as previous dose. Pt verbalized understanding and had no additional questions.

## 2021-01-07 DIAGNOSIS — I639 Cerebral infarction, unspecified: Secondary | ICD-10-CM | POA: Insufficient documentation

## 2021-01-15 ENCOUNTER — Ambulatory Visit (INDEPENDENT_AMBULATORY_CARE_PROVIDER_SITE_OTHER): Payer: Medicare Other

## 2021-01-15 ENCOUNTER — Other Ambulatory Visit: Payer: Self-pay

## 2021-01-15 ENCOUNTER — Encounter: Payer: Self-pay | Admitting: Cardiology

## 2021-01-15 ENCOUNTER — Ambulatory Visit (INDEPENDENT_AMBULATORY_CARE_PROVIDER_SITE_OTHER): Payer: Medicare Other | Admitting: Cardiology

## 2021-01-15 VITALS — BP 148/78 | HR 88 | Ht 64.0 in | Wt 223.1 lb

## 2021-01-15 DIAGNOSIS — I480 Paroxysmal atrial fibrillation: Secondary | ICD-10-CM | POA: Diagnosis not present

## 2021-01-15 DIAGNOSIS — I495 Sick sinus syndrome: Secondary | ICD-10-CM | POA: Diagnosis not present

## 2021-01-15 DIAGNOSIS — Z950 Presence of cardiac pacemaker: Secondary | ICD-10-CM | POA: Diagnosis not present

## 2021-01-15 DIAGNOSIS — E088 Diabetes mellitus due to underlying condition with unspecified complications: Secondary | ICD-10-CM

## 2021-01-15 DIAGNOSIS — I10 Essential (primary) hypertension: Secondary | ICD-10-CM | POA: Diagnosis not present

## 2021-01-15 DIAGNOSIS — E785 Hyperlipidemia, unspecified: Secondary | ICD-10-CM

## 2021-01-15 NOTE — Progress Notes (Signed)
Cardiology Office Note:    Date:  01/15/2021   ID:  NEYA CREEGAN, DOB [birth year], MRN [id number]  PCP:  Mendel Ryder, PA-C  Cardiologist:  Garwin Brothers, MD   Referring MD: Mendel Ryder, PA-C    ASSESSMENT:    1. PAF (paroxysmal atrial fibrillation) (HCC)   2. Essential hypertension   3. Hyperlipidemia LDL goal <100   4. S/P placement of cardiac pacemaker   5. Diabetes mellitus due to underlying condition with unspecified complications (HCC)    PLAN:    In order of problems listed above:  1. Primary prevention stressed with the patient.  Importance of compliance with diet medication stressed and she vocalized understanding. 2. Essential hypertension: Blood pressure stable.  She keeps a record of blood pressures at home and tells me that blood pressures in the range of 130 systolic and 70 diastolic.  She is very careful about diet and lifestyle modification.  She is on diuretic and multiple medications so we will get a Chem-7 today. 3. Mixed dyslipidemia: On statin therapy and lipids were reviewed.  Diet was emphasized.  She is a diabetic. 4. Diabetes mellitus: As mentioned above. 5. Patient will be seen in follow-up appointment in 6 months or earlier if the patient has any concerns 6. Obesity: Weight reduction was stressed risks of obesity explained and she promises to do better.   Medication Adjustments/Labs and Tests Ordered: Current medicines are reviewed at length with the patient today.  Concerns regarding medicines are outlined above.  No orders of the defined types were placed in this encounter.  No orders of the defined types were placed in this encounter.    No chief complaint on file.    History of Present Illness:    Brooke Pitts is a 75 y.o. female.  Patient has past medical history of paroxysmal atrial fibrillation, essential hypertension, dyslipidemia and diabetes mellitus.  She denies any problems at this time and takes care of activities of  daily living.  No chest pain orthopnea or PND.  At the time of my evaluation, the patient is alert awake oriented and in no distress.  Past Medical History:  Diagnosis Date  . A-fib (HCC)   . BPPV (benign paroxysmal positional vertigo) 11/17/2015  . CPAP/BiPAP dependence 11/17/2015  . Crohn's disease of ileum (HCC) 11/17/2015  . Diabetes mellitus due to underlying condition with unspecified complications (HCC) 03/04/2019  . Diabetes mellitus without complication (HCC)   . 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 Essential hypertension 10/03/2015  . GERD (gastroesophageal reflux disease) 11/03/2015  . History of cataract 11/17/2015  . History of CVA (cerebrovascular accident) 10/18/2015   Overview:  May 2014 or 2015  Formatting of this note might be different from the original. Overview:  Overview:  May 2014 or 2015 Formatting of this note might be different from the original. May 2014 or 2015  . History of osteopenia 11/17/2015  . Hyperlipidemia LDL goal <100 10/03/2015   Overview:  Multi statin intolerant  Formatting of this note might be different from the original. Overview:  Overview:  Multi statin intolerant Formatting of this note might be different from the original. Multi statin intolerant. Statin intolerance will observe. No need to draw lipid panel in future.  . Hypertension   . Hypokalemia 06/30/2016  . Localized edema 09/20/2018  . Long term current use of anticoagulant therapy 10/18/2015  . Low magnesium level 07/08/2016  . NSVT (nonsustained ventricular tachycardia) (HCC) 01/27/2019  . Obesity (BMI 30.0-34.9) 09/18/2016  .  OSA on CPAP 11/17/2015  . Pacemaker reprogramming/check 11/20/2015  . PAF (paroxysmal atrial fibrillation) (HCC) 01/27/2018  . Preoperative cardiovascular examination 08/10/2019  . 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 Risk for falls 08/21/2016  . S/P placement of cardiac pacemaker 11/17/2015  . SSS (sick sinus syndrome) (HCC) 11/20/2015  . Statin intolerance 09/20/2018  .  Stroke (HCC)   . Type 2 diabetes mellitus (HCC) 11/02/2015  . Vitamin D deficiency 12/03/2015    Past Surgical History:  Procedure Laterality Date  . ABDOMINAL HYSTERECTOMY    . BILATERAL OOPHORECTOMY    . CHOLECYSTECTOMY    . PACEMAKER INSERTION    . TONSILLECTOMY      Current Medications: Current Meds  Medication Sig  . apixaban (ELIQUIS) 5 MG TABS tablet Take 1 tablet (5 mg total) by mouth 2 (two) times daily.  Marland Kitchen atenolol (TENORMIN) 50 MG tablet Take 1.5 tablets (75 mg total) by mouth 2 (two) times daily.  Marland Kitchen b complex vitamins tablet Take 1 tablet by mouth daily.  . chlorthalidone (HYGROTON) 50 MG tablet Take 25 mg by mouth daily.  Marland Kitchen dimenhyDRINATE (DRAMAMINE) 50 MG tablet Take 50 mg by mouth every 8 (eight) hours as needed for dizziness.  . irbesartan (AVAPRO) 300 MG tablet Take 300 mg by mouth daily.  Marland Kitchen loratadine (CLARITIN) 10 MG tablet Take 10 mg by mouth daily as needed for allergies or rhinitis.  . magnesium oxide (MAG-OX) 400 MG tablet Take 400 mg by mouth daily.  . metFORMIN (GLUCOPHAGE-XR) 500 MG 24 hr tablet Take 500 mg by mouth 3 (three) times daily. Patient takes 500 mg in the morning and 1000 mg in the evening  . methocarbamol (ROBAXIN) 750 MG tablet Take 750 mg by mouth at bedtime.  . Omega-3 Fatty Acids (FISH OIL) 1200 MG CPDR Take 1 capsule by mouth daily.      Allergies:   Flecainide, Cymbalta [duloxetine hcl], Hydrocodone, Lisinopril, Amlodipine, Duloxetine, Losartan, and Nsaids   Social History   Socioeconomic History  . Marital status: Married    Spouse name: Not on file  . Number of children: Not on file  . Years of education: Not on file  . Highest education level: Not on file  Occupational History  . Not on file  Tobacco Use  . Smoking status: Never Smoker  . Smokeless tobacco: Never Used  Vaping Use  . Vaping Use: Never used  Substance and Sexual Activity  . Alcohol use: No  . Drug use: No  . Sexual activity: Not on file  Other Topics  Concern  . Not on file  Social History Narrative  . Not on file   Social Determinants of Health   Financial Resource Strain: Not on file  Food Insecurity: Not on file  Transportation Needs: Not on file  Physical Activity: Not on file  Stress: Not on file  Social Connections: Not on file     Family History: The patient's family history includes Arrhythmia in her mother; Atrial fibrillation in her mother; Heart disease in her brother; Heart failure in her brother.  ROS:   Please see the history of present illness.    All other systems reviewed and are negative.  EKGs/Labs/Other Studies Reviewed:    The following studies were reviewed today: IMPRESSION: 1. Coronary calcium score of 0. This was 0 percentile for age and sex matched control.  2. Normal coronary origin with right dominance.  3. No evidence of CAD.  Thomasene Ripple, DO   Electronically Signed  By: Thomasene Ripple DO   On: 05/31/2020 21:19    Recent Labs: 04/18/2020: B Natriuretic Peptide 36.6; Hemoglobin 13.1; Magnesium 1.7; Platelets 246 11/27/2020: BUN 12; Creatinine, Ser 0.67; Potassium 4.0; Sodium 142  Recent Lipid Panel    Component Value Date/Time   CHOL 181 07/05/2019 1011   TRIG 166 (H) 07/05/2019 1011   HDL 55 07/05/2019 1011   CHOLHDL 3.3 07/05/2019 1011   LDLCALC 97 07/05/2019 1011    Physical Exam:    VS:  BP (!) 148/78   Pulse 88   Ht 5\' 4"  (1.626 m)   Wt 223 lb 1.9 oz (101.2 kg)   SpO2 98%   BMI 38.30 kg/m     Wt Readings from Last 3 Encounters:  01/15/21 223 lb 1.9 oz (101.2 kg)  11/27/20 221 lb 12.8 oz (100.6 kg)  08/16/20 223 lb 3.2 oz (101.2 kg)     GEN: Patient is in no acute distress HEENT: Normal NECK: No JVD; No carotid bruits LYMPHATICS: No lymphadenopathy CARDIAC: Hear sounds regular, 2/6 systolic murmur at the apex. RESPIRATORY:  Clear to auscultation without rales, wheezing or rhonchi  ABDOMEN: Soft, non-tender, non-distended MUSCULOSKELETAL:  No edema; No  deformity  SKIN: Warm and dry NEUROLOGIC:  Alert and oriented x 3 PSYCHIATRIC:  Normal affect   Signed, 13/04/21, MD  01/15/2021 1:29 PM    [facility]

## 2021-01-15 NOTE — Patient Instructions (Signed)
Medication Instructions:  No medication changes. *If you need a refill on your cardiac medications before your next appointment, please call your pharmacy*   Lab Work: Your physician recommends that you have a BMET today in the office.  If you have labs (blood work) drawn today and your tests are completely normal, you will receive your results only by: . MyChart Message (if you have MyChart) OR . A paper copy in the mail If you have any lab test that is abnormal or we need to change your treatment, we will call you to review the results.   Testing/Procedures: None ordered   Follow-Up: At [facility], you and your health needs are our priority.  As part of our continuing mission to provide you with exceptional heart care, we have created designated Provider Care Teams.  These Care Teams include your primary Cardiologist (physician) and Advanced Practice Providers (APPs -  Physician Assistants and Nurse Practitioners) who all work together to provide you with the care you need, when you need it.  We recommend signing up for the patient portal called "MyChart".  Sign up information is provided on this After Visit Summary.  MyChart is used to connect with patients for Virtual Visits (Telemedicine).  Patients are able to view lab/test results, encounter notes, upcoming appointments, etc.  Non-urgent messages can be sent to your provider as well.   To learn more about what you can do with MyChart, go to [email or web address].    Your next appointment:   4 month(s)  The format for your next appointment:   In Person  Provider:   Rajan Revankar, MD   Other Instructions NA  

## 2021-01-16 LAB — BASIC METABOLIC PANEL
BUN/Creatinine Ratio: 14 (ref 12–28)
BUN: 9 mg/dL (ref 8–27)
CO2: 24 mmol/L (ref 20–29)
Calcium: 9.6 mg/dL (ref 8.7–10.3)
Chloride: 96 mmol/L (ref 96–106)
Creatinine, Ser: 0.66 mg/dL (ref 0.57–1.00)
Glucose: 88 mg/dL (ref 65–99)
Potassium: 4 mmol/L (ref 3.5–5.2)
Sodium: 136 mmol/L (ref 134–144)
eGFR: 93 mL/min/{1.73_m2} (ref >59)

## 2021-01-17 LAB — CUP PACEART REMOTE DEVICE CHECK
Battery Remaining Longevity: 38 mo
Battery Voltage: 2.96 V
Brady Statistic AP VP Percent: 0.12 %
Brady Statistic AP VS Percent: 75.13 %
Brady Statistic AS VP Percent: 0.01 %
Brady Statistic AS VS Percent: 24.74 %
Brady Statistic RA Percent Paced: 73.97 %
Brady Statistic RV Percent Paced: 0.13 %
Date Time Interrogation Session: 20220405082013
Implantable Lead Implant Date: 20150114
Implantable Lead Implant Date: 20150114
Implantable Lead Location: 753859
Implantable Lead Location: 753860
Implantable Lead Model: 5076
Implantable Lead Model: 5076
Implantable Pulse Generator Implant Date: 20150114
Lead Channel Impedance Value: 323 Ohm
Lead Channel Impedance Value: 418 Ohm
Lead Channel Impedance Value: 437 Ohm
Lead Channel Impedance Value: 532 Ohm
Lead Channel Pacing Threshold Amplitude: 0.875 V
Lead Channel Pacing Threshold Amplitude: 0.875 V
Lead Channel Pacing Threshold Pulse Width: 0.4 ms
Lead Channel Pacing Threshold Pulse Width: 0.4 ms
Lead Channel Sensing Intrinsic Amplitude: 1.625 mV
Lead Channel Sensing Intrinsic Amplitude: 1.625 mV
Lead Channel Sensing Intrinsic Amplitude: 15.375 mV
Lead Channel Sensing Intrinsic Amplitude: 15.375 mV
Lead Channel Setting Pacing Amplitude: 2 V
Lead Channel Setting Pacing Amplitude: 2 V
Lead Channel Setting Pacing Pulse Width: 0.4 ms
Lead Channel Setting Sensing Sensitivity: 0.9 mV

## 2021-01-25 NOTE — Progress Notes (Signed)
Remote pacemaker transmission.   

## 2021-06-03 ENCOUNTER — Other Ambulatory Visit: Payer: Self-pay

## 2021-06-03 ENCOUNTER — Ambulatory Visit (INDEPENDENT_AMBULATORY_CARE_PROVIDER_SITE_OTHER): Payer: Medicare Other | Admitting: Cardiology

## 2021-06-03 ENCOUNTER — Encounter: Payer: Self-pay | Admitting: Cardiology

## 2021-06-03 VITALS — BP 144/80 | HR 80 | Ht 64.0 in | Wt 223.1 lb

## 2021-06-03 DIAGNOSIS — I480 Paroxysmal atrial fibrillation: Secondary | ICD-10-CM | POA: Diagnosis not present

## 2021-06-03 DIAGNOSIS — I472 Ventricular tachycardia: Secondary | ICD-10-CM

## 2021-06-03 DIAGNOSIS — G4733 Obstructive sleep apnea (adult) (pediatric): Secondary | ICD-10-CM

## 2021-06-03 DIAGNOSIS — Z950 Presence of cardiac pacemaker: Secondary | ICD-10-CM

## 2021-06-03 DIAGNOSIS — Z9989 Dependence on other enabling machines and devices: Secondary | ICD-10-CM

## 2021-06-03 DIAGNOSIS — E088 Diabetes mellitus due to underlying condition with unspecified complications: Secondary | ICD-10-CM

## 2021-06-03 DIAGNOSIS — E785 Hyperlipidemia, unspecified: Secondary | ICD-10-CM | POA: Diagnosis not present

## 2021-06-03 DIAGNOSIS — I4729 Other ventricular tachycardia: Secondary | ICD-10-CM

## 2021-06-03 MED ORDER — NEBIVOLOL HCL 10 MG PO TABS: 10.0000 mg | ORAL_TABLET | Freq: Every day | ORAL | 12 refills | Status: DC

## 2021-06-03 NOTE — Progress Notes (Signed)
Cardiology Office Note:    Date:  06/03/2021   ID:  Brooke, Pitts 11-15-1945, MRN [id number]  PCP:  Mendel Ryder, PA-C  Cardiologist:  Garwin Brothers, MD   Referring MD: Mendel Ryder, PA-C    ASSESSMENT:    1. PAF (paroxysmal atrial fibrillation) (HCC)   2. NSVT (nonsustained ventricular tachycardia) (HCC)   3. Hyperlipidemia LDL goal <100   4. Diabetes mellitus due to underlying condition with unspecified complications (HCC)   5. OSA on CPAP   6. S/P placement of cardiac pacemaker    PLAN:    In order of problems listed above:  Primary prevention stressed with the patient.  Importance of compliance with diet medication stressed and she vocalized understanding. Essential hypertension: Blood pressure stable diet was emphasized.  Lifestyle modification urged.  She complains of some wheezing at times so we will change her atenolol to Bystolic 10 mg daily which she will keep a blood pressure log and heart rate log and send it to [place] in.  We may have to change this prescription if she finds that it is too expensive.  But I educated her about this. Mixed dyslipidemia: Diet emphasized.  Lifestyle modification urged.  She has statin on tolerance and last lipid review was fine. Morbid obesity: Weight reduction was stressed diet was emphasized.  She understands.  Report of echo discussed with her at length. History of hypokalemia: We will check a Chem-7 today. Patient will be seen in follow-up appointment in 6 months or earlier if the patient has any concerns    Medication Adjustments/Labs and Tests Ordered: Current medicines are reviewed at length with the patient today.  Concerns regarding medicines are outlined above.  No orders of the defined types were placed in this encounter.  No orders of the defined types were placed in this encounter.    No chief complaint on file.    History of Present Illness:    Brooke Pitts is a 75 y.o. female.  Patient has past  medical history of atrial fibrillation, essential hypertension, dyslipidemia and diabetes mellitus.  She has history of morbid obesity.  She denies any problems at this time and takes care of activities of daily living.  No chest pain orthopnea or PND.  She has permanent pacemaker.  She leads a sedentary lifestyle.  At the time of my evaluation, the patient is alert awake oriented and in no distress.  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 Overview:  Overview:  May 2014 or 2015 Formatting of this note might be different from the original. May 2014 or 2015   History of osteopenia 11/17/2015   Hyperlipidemia LDL goal <100 10/03/2015   Overview:  Multi statin intolerant  Formatting of this note might be different from the original. Overview:  Overview:  Multi statin intolerant Formatting of this note might be different from the original. Multi statin intolerant. Statin intolerance will observe. No  need to draw lipid panel in future.   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   atenolol (TENORMIN) 50 MG tablet Take 75 mg by mouth 2 (two) times daily.   b complex vitamins tablet Take 1 tablet by mouth daily.   chlorthalidone (HYGROTON) 50 MG tablet Take 25 mg by mouth daily.   dimenhyDRINATE (DRAMAMINE) 50 MG tablet Take 50 mg by mouth every 8 (eight) hours as needed for dizziness.   irbesartan (AVAPRO) 300 MG tablet Take 300 mg by mouth daily.   loratadine (CLARITIN) 10 MG tablet Take 10 mg by mouth daily as needed for allergies or rhinitis.   magnesium oxide (MAG-OX) 400 MG tablet Take 400 mg by mouth daily.   metFORMIN (GLUCOPHAGE-XR) 500 MG 24 hr tablet Take 500 mg by mouth 3 (three) times daily. Patient takes 500 mg in the morning and 1000 mg in the evening   nitroGLYCERIN (NITROSTAT) 0.4 MG SL tablet Place 0.4 mg under the tongue every 5 (five) minutes as needed for chest pain.   Omega-3 Fatty Acids (FISH OIL) 1200 MG CPDR Take 1 capsule by mouth daily.      Allergies:   Flecainide, Cymbalta [duloxetine hcl], Hydrocodone, Lisinopril, Amlodipine, Losartan, and Nsaids   Social History   Socioeconomic History   Marital status: Married    Spouse name: Not on file   Number of children: Not on file   Years of education: Not on file   Highest education level: Not on file  Occupational History   Not on file   Tobacco Use   Smoking status: Never   Smokeless tobacco: Never  Vaping Use   Vaping Use: Never used  Substance and Sexual Activity   Alcohol use: No   Drug use: No   Sexual activity: Not on file  Other Topics Concern   Not on file  Social History Narrative   Not on file   Social Determinants of Health   Financial Resource Strain: Not on file  Food Insecurity: Not on file  Transportation Needs: Not on file  Physical Activity: Not on file  Stress: Not on file  Social Connections: Not on file     Family History: The patient's family history includes Arrhythmia in her mother; Atrial fibrillation in her mother; Heart disease in her brother; Heart failure in her brother.  ROS:   Please see the history of present illness.    All other systems reviewed  and are negative.  EKGs/Labs/Other Studies Reviewed:    The following studies were reviewed today: I discussed my findings with the patient at length.   Recent Labs: 01/15/2021: BUN 9; Creatinine, Ser 0.66; Potassium 4.0; Sodium 136  Recent Lipid Panel    Component Value Date/Time   CHOL 181 07/05/2019 1011   TRIG 166 (H) 07/05/2019 1011   HDL 55 07/05/2019 1011   CHOLHDL 3.3 07/05/2019 1011   LDLCALC 97 07/05/2019 1011    Physical Exam:    VS:  BP (!) 144/80   Pulse 80   Ht 5\' 4"  (1.626 m)   Wt 223 lb 1.3 oz (101.2 kg)   BMI 38.29 kg/m     Wt Readings from Last 3 Encounters:  06/03/21 223 lb 1.3 oz (101.2 kg)  01/15/21 223 lb 1.9 oz (101.2 kg)  11/27/20 221 lb 12.8 oz (100.6 kg)     GEN: Patient is in no acute distress HEENT: Normal NECK: No JVD; No carotid bruits LYMPHATICS: No lymphadenopathy CARDIAC: Hear sounds regular, 2/6 systolic murmur at the apex. RESPIRATORY:  Clear to auscultation without rales, wheezing or rhonchi  ABDOMEN: Soft, non-tender, non-distended MUSCULOSKELETAL:  No edema; No deformity  SKIN: Warm and dry NEUROLOGIC:  Alert and oriented x 3 PSYCHIATRIC:  Normal affect    Signed, 11/29/20, MD  06/03/2021 9:34 AM    [facility]

## 2021-06-03 NOTE — Patient Instructions (Addendum)
Medication Instructions:  Your physician has recommended you make the following change in your medication:   Stop Atenolol Start Bystolic 10 mg daily  *If you need a refill on your cardiac medications before your next appointment, please call your pharmacy*   Lab Work: Your physician recommends that you have a BMET today in the office.  If you have labs (blood work) drawn today and your tests are completely normal, you will receive your results only by: MyChart Message (if you have MyChart) OR A paper copy in the mail If you have any lab test that is abnormal or we need to change your treatment, we will call you to review the results.   Testing/Procedures: None ordered   Follow-Up: At [facility], you and your health needs are our priority.  As part of our continuing mission to provide you with exceptional heart care, we have created designated Provider Care Teams.  These Care Teams include your primary Cardiologist (physician) and Advanced Practice Providers (APPs -  Physician Assistants and Nurse Practitioners) who all work together to provide you with the care you need, when you need it.  We recommend signing up for the patient portal called "MyChart".  Sign up information is provided on this After Visit Summary.  MyChart is used to connect with patients for Virtual Visits (Telemedicine).  Patients are able to view lab/test results, encounter notes, upcoming appointments, etc.  Non-urgent messages can be sent to your provider as well.   To learn more about what you can do with MyChart, go to [email or web address].    Your next appointment:   6 month(s)  The format for your next appointment:   In Person  Provider:   Belva Crome, MD   Other Instructions   Blood Pressure Record Sheet To take your blood pressure, you will need a blood pressure machine. You can buy a blood pressure machine (blood pressure monitor) at your clinic, drug store, or online. When choosing  one, consider: An automatic monitor that has an arm cuff. A cuff that wraps snugly around your upper arm. You should be able to fit only one finger between your arm and the cuff. A device that stores blood pressure reading results. Do not choose a monitor that measures your blood pressure from your wrist or finger. Follow your health care provider's instructions for how to take your blood pressure. To use this form: Get one reading in the morning (a.m.) 1-2 hours after you take any medicines. Get one reading in the evening (p.m.) before supper. Take at least 2 readings with each blood pressure check. This makes sure the results are correct. Wait 1-2 minutes between measurements. Write down the results in the spaces on this form. Repeat this once a week, or as told by your health care provider.  Make a follow-up appointment with your health care provider to discuss the results. Blood pressure log Date: _______________________ a.m. _____________________(1st reading) HR___________            p.m. _____________________(2nd reading) HR__________  Date: _______________________ a.m. _____________________(1st reading) HR___________            p.m. _____________________(2nd reading) HR__________ Date: _______________________ a.m. _____________________(1st reading) HR___________            p.m. _____________________(2nd reading) HR__________ Date: _______________________ a.m. _____________________(1st reading) HR___________            p.m. _____________________(2nd reading) HR__________  Date: _______________________ a.m. _____________________(1st reading) HR___________            p.m.  _____________________(2nd reading) HR__________  Date: _______________________ a.m. _____________________(1st reading) HR___________            p.m. _____________________(2nd reading) HR__________  Date: _______________________ a.m. _____________________(1st reading) HR___________            p.m.  _____________________(2nd reading) HR__________   This information is not intended to replace advice given to you by your health care provider. Make sure you discuss any questions you have with your health care provider. Document Revised: 01/18/2020 Document Reviewed: 01/18/2020 Elsevier Patient Education  2021 [employer].

## 2021-06-04 ENCOUNTER — Other Ambulatory Visit: Payer: Self-pay

## 2021-06-04 LAB — BASIC METABOLIC PANEL
BUN/Creatinine Ratio: 16 (ref 12–28)
BUN: 13 mg/dL (ref 8–27)
CO2: 22 mmol/L (ref 20–29)
Calcium: 9.3 mg/dL (ref 8.7–10.3)
Chloride: 95 mmol/L — ABNORMAL LOW (ref 96–106)
Creatinine, Ser: 0.79 mg/dL (ref 0.57–1.00)
Glucose: 184 mg/dL — ABNORMAL HIGH (ref 65–99)
Potassium: 3.9 mmol/L (ref 3.5–5.2)
Sodium: 134 mmol/L (ref 134–144)
eGFR: 79 mL/min/{1.73_m2} (ref >59)

## 2021-06-04 MED ORDER — CHLORTHALIDONE 50 MG PO TABS: 25.0000 mg | ORAL_TABLET | Freq: Every day | ORAL | 3 refills | Status: DC

## 2021-06-04 NOTE — Telephone Encounter (Signed)
Refill of Chlorthalidone 50 mg sent to [facility].

## 2021-07-17 ENCOUNTER — Telehealth: Payer: Self-pay

## 2021-07-17 NOTE — Telephone Encounter (Signed)
Pt is receiving a new monitor in 7-10 business days.

## 2021-07-22 ENCOUNTER — Ambulatory Visit (INDEPENDENT_AMBULATORY_CARE_PROVIDER_SITE_OTHER): Payer: Medicare Other

## 2021-07-22 DIAGNOSIS — I495 Sick sinus syndrome: Secondary | ICD-10-CM | POA: Diagnosis not present

## 2021-07-24 LAB — CUP PACEART REMOTE DEVICE CHECK
Battery Remaining Longevity: 33 mo
Battery Voltage: 2.95 V
Brady Statistic AP VP Percent: 0.24 %
Brady Statistic AP VS Percent: 80.22 %
Brady Statistic AS VP Percent: 0.01 %
Brady Statistic AS VS Percent: 19.53 %
Brady Statistic RA Percent Paced: 79.39 %
Brady Statistic RV Percent Paced: 0.25 %
Date Time Interrogation Session: 20221010120241
Implantable Lead Implant Date: 20150114
Implantable Lead Implant Date: 20150114
Implantable Lead Location: 753859
Implantable Lead Location: 753860
Implantable Lead Model: 5076
Implantable Lead Model: 5076
Implantable Pulse Generator Implant Date: 20150114
Lead Channel Impedance Value: 323 Ohm
Lead Channel Impedance Value: 418 Ohm
Lead Channel Impedance Value: 437 Ohm
Lead Channel Impedance Value: 532 Ohm
Lead Channel Pacing Threshold Amplitude: 0.75 V
Lead Channel Pacing Threshold Amplitude: 1 V
Lead Channel Pacing Threshold Pulse Width: 0.4 ms
Lead Channel Pacing Threshold Pulse Width: 0.4 ms
Lead Channel Sensing Intrinsic Amplitude: 1.625 mV
Lead Channel Sensing Intrinsic Amplitude: 1.625 mV
Lead Channel Sensing Intrinsic Amplitude: 15.875 mV
Lead Channel Sensing Intrinsic Amplitude: 15.875 mV
Lead Channel Setting Pacing Amplitude: 2 V
Lead Channel Setting Pacing Amplitude: 2 V
Lead Channel Setting Pacing Pulse Width: 0.4 ms
Lead Channel Setting Sensing Sensitivity: 0.9 mV

## 2021-07-31 NOTE — Progress Notes (Signed)
Remote pacemaker transmission.   

## 2021-08-26 DIAGNOSIS — R2681 Unsteadiness on feet: Secondary | ICD-10-CM

## 2021-08-26 HISTORY — DX: Unsteadiness on feet: R26.81

## 2021-10-03 ENCOUNTER — Telehealth: Payer: Self-pay | Admitting: Cardiology

## 2021-10-03 NOTE — Telephone Encounter (Signed)
Reviewed patient's medication list with her.  Updated medication list in chart.

## 2021-10-03 NOTE — Telephone Encounter (Signed)
Pt c/o medication issue:  1. Name of Medication: nebivolol (BYSTOLIC) 10 MG tablet  2. How are you currently taking this medication (dosage and times per day)? AS DIRECTED  3. Are you having a reaction (difficulty breathing--STAT)? NO  4. What is your medication issue? PT HAVE BEEN EXPERIENCING DIZZINESS , HEADACHE AND RIGHT HAND IS ALWAYS COLD. THIS HAVE BEEN GOING ON FOR 4 MONTHS

## 2021-10-04 NOTE — Telephone Encounter (Signed)
Spoke with pt who states that her doctor has adjusted her medication to see if they help with her dizziness/cold hand but no improvement. Pt states that she has saw ENT for same. BP 149/79, 131/74, 136/73 and 144/80. Advised that Dr. Tomie China will address upon his return. Pt verbalized understanding and had no additional questions.

## 2021-10-08 MED ORDER — ATENOLOL 50 MG PO TABS: 75.0000 mg | ORAL_TABLET | Freq: Two times a day (BID) | ORAL | 3 refills | Status: DC

## 2021-10-08 NOTE — Addendum Note (Signed)
Addended by: Eleonore Chiquito on: 10/08/2021 05:16 PM   Modules accepted: Orders

## 2021-10-08 NOTE — Telephone Encounter (Signed)
Pt states she would like to go back on Atenolol. Pt states she was taken off because she had occasional wheezing but since being off of it she still has occasional wheezing as before. HR  12/19  77 12/20  72 12/21  74 12/22  76 12/23  82 12/24  78 12/25  78 12/26  80 12/27 78  How do you advise?

## 2021-10-08 NOTE — Telephone Encounter (Signed)
Recommendations reviewed with pt as per Dr. Revankar's note.  Pt verbalized understanding and had no additional questions.   

## 2021-10-21 ENCOUNTER — Ambulatory Visit (INDEPENDENT_AMBULATORY_CARE_PROVIDER_SITE_OTHER): Payer: Medicare Other

## 2021-10-21 DIAGNOSIS — I495 Sick sinus syndrome: Secondary | ICD-10-CM | POA: Diagnosis not present

## 2021-10-22 LAB — CUP PACEART REMOTE DEVICE CHECK
Battery Remaining Longevity: 31 mo
Battery Voltage: 2.94 V
Brady Statistic AP VP Percent: 0.84 %
Brady Statistic AP VS Percent: 77.02 %
Brady Statistic AS VP Percent: 0.03 %
Brady Statistic AS VS Percent: 22.11 %
Brady Statistic RA Percent Paced: 76.87 %
Brady Statistic RV Percent Paced: 0.85 %
Date Time Interrogation Session: 20230110104206
Implantable Lead Implant Date: 20150114
Implantable Lead Implant Date: 20150114
Implantable Lead Location: 753859
Implantable Lead Location: 753860
Implantable Lead Model: 5076
Implantable Lead Model: 5076
Implantable Pulse Generator Implant Date: 20150114
Lead Channel Impedance Value: 323 Ohm
Lead Channel Impedance Value: 399 Ohm
Lead Channel Impedance Value: 437 Ohm
Lead Channel Impedance Value: 532 Ohm
Lead Channel Pacing Threshold Amplitude: 1 V
Lead Channel Pacing Threshold Amplitude: 1 V
Lead Channel Pacing Threshold Pulse Width: 0.4 ms
Lead Channel Pacing Threshold Pulse Width: 0.4 ms
Lead Channel Sensing Intrinsic Amplitude: 1.75 mV
Lead Channel Sensing Intrinsic Amplitude: 1.75 mV
Lead Channel Sensing Intrinsic Amplitude: 16.375 mV
Lead Channel Sensing Intrinsic Amplitude: 16.375 mV
Lead Channel Setting Pacing Amplitude: 2 V
Lead Channel Setting Pacing Amplitude: 2 V
Lead Channel Setting Pacing Pulse Width: 0.4 ms
Lead Channel Setting Sensing Sensitivity: 0.9 mV

## 2021-10-26 ENCOUNTER — Other Ambulatory Visit: Payer: Self-pay | Admitting: Cardiology

## 2021-10-28 NOTE — Telephone Encounter (Signed)
Prescription refill request for Eliquis received. Indication:Afib Last office visit:8/22 Scr:0.8 Age: 76 Weight:101.2 kg  Prescription refilled

## 2021-10-29 NOTE — Progress Notes (Signed)
Remote pacemaker transmission.   

## 2021-11-30 ENCOUNTER — Other Ambulatory Visit: Payer: Self-pay | Admitting: Cardiology

## 2021-12-02 NOTE — Telephone Encounter (Signed)
Prescription refill request for Eliquis received. °Indication:Afib °Last office visit:8/22 °Scr:0.8 °Age: 75 °Weight:101.2 kg ° °Prescription refilled ° °

## 2021-12-10 ENCOUNTER — Ambulatory Visit: Payer: Medicare Other | Admitting: Cardiology

## 2022-01-03 DIAGNOSIS — M2022 Hallux rigidus, left foot: Secondary | ICD-10-CM

## 2022-01-03 HISTORY — DX: Hallux rigidus, left foot: M20.22

## 2022-01-09 ENCOUNTER — Encounter: Payer: Self-pay | Admitting: Cardiology

## 2022-01-09 ENCOUNTER — Ambulatory Visit (INDEPENDENT_AMBULATORY_CARE_PROVIDER_SITE_OTHER): Payer: Medicare Other | Admitting: Cardiology

## 2022-01-09 VITALS — BP 122/80 | HR 77 | Ht 64.0 in | Wt 224.0 lb

## 2022-01-09 DIAGNOSIS — I4729 Other ventricular tachycardia: Secondary | ICD-10-CM

## 2022-01-09 DIAGNOSIS — I10 Essential (primary) hypertension: Secondary | ICD-10-CM

## 2022-01-09 DIAGNOSIS — E088 Diabetes mellitus due to underlying condition with unspecified complications: Secondary | ICD-10-CM

## 2022-01-09 DIAGNOSIS — Z950 Presence of cardiac pacemaker: Secondary | ICD-10-CM

## 2022-01-09 DIAGNOSIS — Z789 Other specified health status: Secondary | ICD-10-CM

## 2022-01-09 DIAGNOSIS — E785 Hyperlipidemia, unspecified: Secondary | ICD-10-CM | POA: Diagnosis not present

## 2022-01-09 DIAGNOSIS — I480 Paroxysmal atrial fibrillation: Secondary | ICD-10-CM | POA: Diagnosis not present

## 2022-01-09 MED ORDER — NITROGLYCERIN 0.4 MG SL SUBL: 0.4000 mg | SUBLINGUAL_TABLET | SUBLINGUAL | 11 refills | Status: AC | PRN

## 2022-01-09 MED ORDER — ATENOLOL 50 MG PO TABS: 75.0000 mg | ORAL_TABLET | Freq: Two times a day (BID) | ORAL | 3 refills | Status: DC

## 2022-01-09 NOTE — Progress Notes (Signed)
?Cardiology Office Note:   ? ?Date:  01/09/2022  ? ?ID:  Brooke Pitts, DOB [birth year], MRN [id number] ? ?PCP:  Mendel Ryder, PA-C  ?Cardiologist:  Garwin Brothers, MD  ? ?Referring MD: Mendel Ryder, PA-C  ? ? ?ASSESSMENT:   ? ?1. Essential hypertension   ?2. Hyperlipidemia LDL goal <100   ?3. NSVT (nonsustained ventricular tachycardia)   ?4. PAF (paroxysmal atrial fibrillation) (HCC)   ?5. Diabetes mellitus due to underlying condition with unspecified complications (HCC)   ?6. S/P placement of cardiac pacemaker   ?7. Statin intolerance   ? ?PLAN:   ? ?In order of problems listed above: ? ?Primary prevention stressed with the patient.  Importance of compliance with diet medication stressed and she vocalized understanding. ?Paroxysmal atrial fibrillation:I discussed with the patient atrial fibrillation, disease process. Management and therapy including rate and rhythm control, anticoagulation benefits and potential risks were discussed extensively with the patient. Patient had multiple questions which were answered to patient's satisfaction. ?Essential hypertension: Blood pressure stable and diet was emphasized.  Lifestyle modification urged. ?Aortic atherosclerosis and mixed dyslipidemia: Her lipids are mildly elevated.  She is vehemently against taking lipid-lowering medications because she could not tolerate statins.  She does not want to try any other medications. ?Obstructive sleep apnea and obesity: Weight reduction stressed diet emphasized.  Risks of obesity explained and she understands and promises to comply. ?Permanent pacemaker: Followed by our electrophysiology colleague.  Records reviewed.  EKG done today reveals atrial pacing and ventricular sensing. ?Patient will be seen in follow-up appointment in 6 months or earlier if the patient has any concerns ? ? ? ?Medication Adjustments/Labs and Tests Ordered: ?Current medicines are reviewed at length with the patient today.  Concerns regarding medicines  are outlined above.  ?No orders of the defined types were placed in this encounter. ? ?No orders of the defined types were placed in this encounter. ? ? ? ?No chief complaint on file. ?  ? ?History of Present Illness:   ? ?Brooke Pitts is a 76 y.o. female.  Patient has past medical history of paroxysmal fibrillation, aortic atherosclerosis, essential hypertension, dyslipidemia and diabetes mellitus.  She denies any problems at this time and takes care of activities of daily living.  No chest pain orthopnea or PND.  At the time of my evaluation, the patient is alert awake oriented and in no distress. ? ?Past Medical History:  ?Diagnosis Date  ? A-fib (HCC)   ? BPPV (benign paroxysmal positional vertigo) 11/17/2015  ? CPAP/BiPAP dependence 11/17/2015  ? Crohn's disease of ileum (HCC) 11/17/2015  ? Diabetes mellitus due to underlying condition with unspecified complications (HCC) 03/04/2019  ? Diabetes mellitus without complication (HCC)   ? 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 Essential hypertension 10/03/2015  ? GERD (gastroesophageal reflux disease) 11/03/2015  ? Hallux rigidus, left foot 01/03/2022  ? History of cataract 11/17/2015  ? History of CVA (cerebrovascular accident) 10/18/2015  ? Overview:  May 2014 or 2015  Formatting of this note might be different from the original. Overview:  Overview:  May 2014 or 2015 Formatting of this note might be different from the original. May 2014 or 2015  ? History of osteopenia 11/17/2015  ? Hyperlipidemia LDL goal <100 10/03/2015  ? Overview:  Multi statin intolerant  Formatting of this note might be different from the original. Overview:  Overview:  Multi statin intolerant Formatting of this note might be different from the original. Multi statin intolerant. Statin intolerance will observe.  No need to draw lipid panel in future.  ? Hypertension   ? Hypokalemia 06/30/2016  ? Localized edema 09/20/2018  ? Long term current use of anticoagulant therapy 10/18/2015  ? Low magnesium level  07/08/2016  ? NSVT (nonsustained ventricular tachycardia) 01/27/2019  ? Obesity (BMI 30.0-34.9) 09/18/2016  ? OSA on CPAP 11/17/2015  ? Pacemaker reprogramming/check 11/20/2015  ? PAF (paroxysmal atrial fibrillation) (HCC) 01/27/2018  ? Preoperative cardiovascular examination 08/10/2019  ? 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 Risk for falls 08/21/2016  ? S/P placement of cardiac pacemaker 11/17/2015  ? SSS (sick sinus syndrome) (HCC) 11/20/2015  ? Statin intolerance 09/20/2018  ? Stroke [facility](HCC)   ? Type 2 diabetes mellitus (HCC) 11/02/2015  ? Unsteadiness 08/26/2021  ? Last Assessment & Plan:  Formatting of this note might be different from the original. Unsteadiness. Acute exacerbation of chronic problem. See history of present illness.  Last 2 weeks has been an issue.  But she has had this on and off for years.  Dramamine helps some.  Primarily unsteady which is worse with change in head position.  Has lasted for days at a time. EXAM shows normal external cana  ? Vitamin D deficiency 12/03/2015  ? ? ?Past Surgical History:  ?Procedure Laterality Date  ? ABDOMINAL HYSTERECTOMY    ? BILATERAL OOPHORECTOMY    ? CHOLECYSTECTOMY    ? PACEMAKER INSERTION    ? TONSILLECTOMY    ? ? ?Current Medications: ?No outpatient medications have been marked as taking for the 01/09/22 encounter (Office Visit) with Shaunee Mulkern, Aundra Dubinajan R, MD.  ?  ? ?Allergies:   Flecainide, Cymbalta [duloxetine hcl], Hydrocodone, Lisinopril, Amlodipine, Losartan, and Nsaids  ? ?Social History  ? ?Socioeconomic History  ? Marital status: Married  ?  Spouse name: Not on file  ? Number of children: Not on file  ? Years of education: Not on file  ? Highest education level: Not on file  ?Occupational History  ? Not on file  ?Tobacco Use  ? Smoking status: Never  ? Smokeless tobacco: Never  ?Vaping Use  ? Vaping Use: Never used  ?Substance and Sexual Activity  ? Alcohol use: No  ? Drug use: No  ? Sexual activity: Not on file  ?Other Topics Concern  ? Not on file  ?Social  History Narrative  ? Not on file  ? ?Social Determinants of Health  ? ?Financial Resource Strain: Not on file  ?Food Insecurity: Not on file  ?Transportation Needs: Not on file  ?Physical Activity: Not on file  ?Stress: Not on file  ?Social Connections: Not on file  ?  ? ?Family History: ?The patient's family history includes Arrhythmia in her mother; Atrial fibrillation in her mother; Heart disease in her brother; Heart failure in her brother. ? ?ROS:   ?Please see the history of present illness.    ?All other systems reviewed and are negative. ? ?EKGs/Labs/Other Studies Reviewed:   ? ?The following studies were reviewed today: ?I discussed my findings with the patient at length. ? ? ?Recent Labs: ?06/03/2021: BUN 13; Creatinine, Ser 0.79; Potassium 3.9; Sodium 134  ?Recent Lipid Panel ?   ?Component Value Date/Time  ? CHOL 181 07/05/2019 1011  ? TRIG 166 (H) 07/05/2019 1011  ? HDL 55 07/05/2019 1011  ? CHOLHDL 3.3 07/05/2019 1011  ? LDLCALC 97 07/05/2019 1011  ? ? ?Physical Exam:   ? ?VS:  There were no vitals taken for this visit.   ? ?Wt Readings from Last  3 Encounters:  ?06/03/21 223 lb 1.3 oz (101.2 kg)  ?01/15/21 223 lb 1.9 oz (101.2 kg)  ?11/27/20 221 lb 12.8 oz (100.6 kg)  ?  ? ?GEN: Patient is in no acute distress ?HEENT: Normal ?NECK: No JVD; No carotid bruits ?LYMPHATICS: No lymphadenopathy ?CARDIAC: Hear sounds regular, 2/6 systolic murmur at the apex. ?RESPIRATORY:  Clear to auscultation without rales, wheezing or rhonchi  ?ABDOMEN: Soft, non-tender, non-distended ?MUSCULOSKELETAL:  No edema; No deformity  ?SKIN: Warm and dry ?NEUROLOGIC:  Alert and oriented x 3 ?PSYCHIATRIC:  Normal affect  ? ?Signed, ?Garwin Brothers, MD  ?01/09/2022 8:48 AM    [facility]  ?

## 2022-01-09 NOTE — Patient Instructions (Signed)
Medication Instructions:  ?Your physician recommends that you continue on your current medications as directed. Please refer to the Current Medication list given to you today.  ?*If you need a refill on your cardiac medications before your next appointment, please call your pharmacy* ? ? ?Lab Work: ?None Ordered ?If you have labs (blood work) drawn today and your tests are completely normal, you will receive your results only by: ?MyChart Message (if you have MyChart) OR ?A paper copy in the mail ?If you have any lab test that is abnormal or we need to change your treatment, we will call you to review the results. ? ? ?Testing/Procedures: ?None Ordered ? ? ?Follow-Up: ?At [facility], you and your health needs are our priority.  As part of our continuing mission to provide you with exceptional heart care, we have created designated Provider Care Teams.  These Care Teams include your primary Cardiologist (physician) and Advanced Practice Providers (APPs -  Physician Assistants and Nurse Practitioners) who all work together to provide you with the care you need, when you need it. ? ?We recommend signing up for the patient portal called "MyChart".  Sign up information is provided on this After Visit Summary.  MyChart is used to connect with patients for Virtual Visits (Telemedicine).  Patients are able to view lab/test results, encounter notes, upcoming appointments, etc.  Non-urgent messages can be sent to your provider as well.   ?To learn more about what you can do with MyChart, go to [email or web address].   ? ?Your next appointment:   ?9 month(s) ? ?The format for your next appointment:   ?In Person ? ?Provider:   ?Rajan Revankar, MD  ? ? ?Other Instructions ?None  ?

## 2022-01-20 ENCOUNTER — Ambulatory Visit (INDEPENDENT_AMBULATORY_CARE_PROVIDER_SITE_OTHER): Payer: Medicare Other

## 2022-01-20 DIAGNOSIS — I495 Sick sinus syndrome: Secondary | ICD-10-CM

## 2022-01-21 LAB — CUP PACEART REMOTE DEVICE CHECK
Battery Remaining Longevity: 28 mo
Battery Voltage: 2.93 V
Brady Statistic AP VP Percent: 0.25 %
Brady Statistic AP VS Percent: 91.1 %
Brady Statistic AS VP Percent: 0 %
Brady Statistic AS VS Percent: 8.64 %
Brady Statistic RA Percent Paced: 89.85 %
Brady Statistic RV Percent Paced: 0.25 %
Date Time Interrogation Session: 20230410075440
Implantable Lead Implant Date: 20150114
Implantable Lead Implant Date: 20150114
Implantable Lead Location: 753859
Implantable Lead Location: 753860
Implantable Lead Model: 5076
Implantable Lead Model: 5076
Implantable Pulse Generator Implant Date: 20150114
Lead Channel Impedance Value: 342 Ohm
Lead Channel Impedance Value: 418 Ohm
Lead Channel Impedance Value: 456 Ohm
Lead Channel Impedance Value: 532 Ohm
Lead Channel Pacing Threshold Amplitude: 0.875 V
Lead Channel Pacing Threshold Amplitude: 1 V
Lead Channel Pacing Threshold Pulse Width: 0.4 ms
Lead Channel Pacing Threshold Pulse Width: 0.4 ms
Lead Channel Sensing Intrinsic Amplitude: 1.5 mV
Lead Channel Sensing Intrinsic Amplitude: 1.5 mV
Lead Channel Sensing Intrinsic Amplitude: 15.5 mV
Lead Channel Sensing Intrinsic Amplitude: 15.5 mV
Lead Channel Setting Pacing Amplitude: 2 V
Lead Channel Setting Pacing Amplitude: 2 V
Lead Channel Setting Pacing Pulse Width: 0.4 ms
Lead Channel Setting Sensing Sensitivity: 0.9 mV

## 2022-02-05 NOTE — Progress Notes (Signed)
Remote pacemaker transmission.   

## 2022-04-21 ENCOUNTER — Ambulatory Visit (INDEPENDENT_AMBULATORY_CARE_PROVIDER_SITE_OTHER): Payer: Medicare Other

## 2022-04-21 DIAGNOSIS — I495 Sick sinus syndrome: Secondary | ICD-10-CM

## 2022-04-21 LAB — CUP PACEART REMOTE DEVICE CHECK
Battery Remaining Longevity: 26 mo
Battery Voltage: 2.93 V
Brady Statistic AP VP Percent: 0.37 %
Brady Statistic AP VS Percent: 72.35 %
Brady Statistic AS VP Percent: 0.01 %
Brady Statistic AS VS Percent: 27.28 %
Brady Statistic RA Percent Paced: 71.7 %
Brady Statistic RV Percent Paced: 0.36 %
Date Time Interrogation Session: 20230710075320
Implantable Lead Implant Date: 20150114
Implantable Lead Implant Date: 20150114
Implantable Lead Location: 753859
Implantable Lead Location: 753860
Implantable Lead Model: 5076
Implantable Lead Model: 5076
Implantable Pulse Generator Implant Date: 20150114
Lead Channel Impedance Value: 323 Ohm
Lead Channel Impedance Value: 418 Ohm
Lead Channel Impedance Value: 437 Ohm
Lead Channel Impedance Value: 532 Ohm
Lead Channel Pacing Threshold Amplitude: 0.875 V
Lead Channel Pacing Threshold Amplitude: 1 V
Lead Channel Pacing Threshold Pulse Width: 0.4 ms
Lead Channel Pacing Threshold Pulse Width: 0.4 ms
Lead Channel Sensing Intrinsic Amplitude: 1.75 mV
Lead Channel Sensing Intrinsic Amplitude: 1.75 mV
Lead Channel Sensing Intrinsic Amplitude: 15.5 mV
Lead Channel Sensing Intrinsic Amplitude: 15.5 mV
Lead Channel Setting Pacing Amplitude: 2 V
Lead Channel Setting Pacing Amplitude: 2 V
Lead Channel Setting Pacing Pulse Width: 0.4 ms
Lead Channel Setting Sensing Sensitivity: 0.9 mV

## 2022-04-27 DIAGNOSIS — I4891 Unspecified atrial fibrillation: Secondary | ICD-10-CM | POA: Diagnosis not present

## 2022-04-27 DIAGNOSIS — I10 Essential (primary) hypertension: Secondary | ICD-10-CM | POA: Diagnosis not present

## 2022-04-27 DIAGNOSIS — R0602 Shortness of breath: Secondary | ICD-10-CM | POA: Insufficient documentation

## 2022-04-27 DIAGNOSIS — M79661 Pain in right lower leg: Secondary | ICD-10-CM | POA: Insufficient documentation

## 2022-04-27 DIAGNOSIS — Z7901 Long term (current) use of anticoagulants: Secondary | ICD-10-CM | POA: Insufficient documentation

## 2022-04-27 DIAGNOSIS — M25519 Pain in unspecified shoulder: Secondary | ICD-10-CM | POA: Diagnosis not present

## 2022-04-27 DIAGNOSIS — Z79899 Other long term (current) drug therapy: Secondary | ICD-10-CM | POA: Diagnosis not present

## 2022-04-28 ENCOUNTER — Other Ambulatory Visit: Payer: Self-pay

## 2022-04-28 ENCOUNTER — Emergency Department (HOSPITAL_BASED_OUTPATIENT_CLINIC_OR_DEPARTMENT_OTHER)
Admission: EM | Admit: 2022-04-28 | Discharge: 2022-04-28 | Disposition: A | Discharge status: Home / Self Care | Payer: Medicare Other | Attending: Emergency Medicine | Admitting: Emergency Medicine

## 2022-04-28 ENCOUNTER — Emergency Department (HOSPITAL_BASED_OUTPATIENT_CLINIC_OR_DEPARTMENT_OTHER): Payer: Medicare Other

## 2022-04-28 ENCOUNTER — Encounter (HOSPITAL_BASED_OUTPATIENT_CLINIC_OR_DEPARTMENT_OTHER): Payer: Self-pay | Admitting: Emergency Medicine

## 2022-04-28 DIAGNOSIS — R0600 Dyspnea, unspecified: Secondary | ICD-10-CM

## 2022-04-28 DIAGNOSIS — R0602 Shortness of breath: Secondary | ICD-10-CM | POA: Diagnosis not present

## 2022-04-28 DIAGNOSIS — M79605 Pain in left leg: Secondary | ICD-10-CM

## 2022-04-28 LAB — BASIC METABOLIC PANEL
Anion gap: 9 (ref 5–15)
BUN: 16 mg/dL (ref 8–23)
CO2: 27 mmol/L (ref 22–32)
Calcium: 9.2 mg/dL (ref 8.9–10.3)
Chloride: 97 mmol/L — ABNORMAL LOW (ref 98–111)
Creatinine, Ser: 0.79 mg/dL (ref 0.44–1.00)
GFR, Estimated: >60 mL/min (ref >60)
Glucose, Bld: 194 mg/dL — ABNORMAL HIGH (ref 70–99)
Potassium: 3.4 mmol/L — ABNORMAL LOW (ref 3.5–5.1)
Sodium: 133 mmol/L — ABNORMAL LOW (ref 135–145)

## 2022-04-28 LAB — PROTIME-INR
INR: 1 (ref 0.8–1.2)
Prothrombin Time: 13.2 seconds (ref 11.4–15.2)

## 2022-04-28 LAB — CBC
HCT: 36.8 % (ref 36.0–46.0)
Hemoglobin: 12.5 g/dL (ref 12.0–15.0)
MCH: 30.5 pg (ref 26.0–34.0)
MCHC: 34 g/dL (ref 30.0–36.0)
MCV: 89.8 fL (ref 80.0–100.0)
Platelets: 202 10*3/uL (ref 150–400)
RBC: 4.1 MIL/uL (ref 3.87–5.11)
RDW: 12.6 % (ref 11.5–15.5)
WBC: 6.8 10*3/uL (ref 4.0–10.5)
nRBC: 0 % (ref 0.0–0.2)

## 2022-04-28 LAB — TROPONIN I (HIGH SENSITIVITY)
Troponin I (High Sensitivity): 4 ng/L (ref <18)
Troponin I (High Sensitivity): 3 ng/L (ref <18)

## 2022-04-28 NOTE — ED Provider Notes (Signed)
[facility] EMERGENCY DEPARTMENT Provider Note   CSN: [id number] Arrival date & time: 04/27/22  2345     History  Chief Complaint  Patient presents with   Leg Pain   Shoulder Pain    Brooke Pitts is a 76 y.o. female.  Patient is a 76 year old female with past medical history of pacemaker/defibrillator placement, cardiomyopathy, hypertension, atrial fibrillation on Eliquis, diabetes, prior CVA.  Patient presenting today with complaints of shortness of breath and leg pain.  She was asleep, then was awakened with the sensation she could not catch her breath.  She then describes a pain in her right calf.  Both of these pains lasted for several minutes, but seem to be improving.  She denies any chest pain, fevers, or chills.  She denies any injury or trauma.  She reports being compliant with her Eliquis.  The history is provided by the patient.       Home Medications Prior to Admission medications   Medication Sig Start Date End Date Taking? Authorizing Provider  apixaban (ELIQUIS) 5 MG TABS tablet Take 1 tablet by mouth twice daily 12/02/21   Revankar, Aundra Dubin, MD  atenolol (TENORMIN) 50 MG tablet Take 1.5 tablets (75 mg total) by mouth 2 (two) times daily. 01/09/22   Revankar, Aundra Dubin, MD  b complex vitamins tablet Take 1 tablet by mouth daily.    [provider]  dimenhyDRINATE (DRAMAMINE) 50 MG tablet Take 50 mg by mouth every 8 (eight) hours as needed for dizziness.    [provider]  irbesartan (AVAPRO) 300 MG tablet Take 300 mg by mouth daily. 06/18/17   [provider]  loratadine (CLARITIN) 10 MG tablet Take 10 mg by mouth daily as needed for allergies or rhinitis.    [provider]  magnesium oxide (MAG-OX) 400 MG tablet Take 400 mg by mouth daily.    [provider]  metFORMIN (GLUCOPHAGE-XR) 500 MG 24 hr tablet Take 500 mg by mouth in the morning. and take 1000 mg in the evening 11/26/20   [provider]   nitroGLYCERIN (NITROSTAT) 0.4 MG SL tablet Place 1 tablet (0.4 mg total) under the tongue every 5 (five) minutes as needed for chest pain. 01/09/22   Revankar, Aundra Dubin, MD  Omega-3 Fatty Acids (FISH OIL) 1200 MG CPDR Take 1 capsule by mouth daily.     [provider]      Allergies    Flecainide, Cymbalta [duloxetine hcl], Hydrocodone, Lisinopril, Amlodipine, Losartan, and Nsaids    Review of Systems   Review of Systems  All other systems reviewed and are negative.   Physical Exam Updated Vital Signs BP (!) 161/73   Pulse 70   Temp 97.9 F (36.6 C) (Oral)   Resp 18   Ht 5\' 5"  (1.651 m)   Wt 101.2 kg   SpO2 99%   BMI 37.11 kg/m  Physical Exam Vitals and nursing note reviewed.  Constitutional:      General: She is not in acute distress.    Appearance: She is well-developed. She is not diaphoretic.  HENT:     Head: Normocephalic and atraumatic.  Cardiovascular:     Rate and Rhythm: Normal rate and regular rhythm.     Heart sounds: No murmur heard.    No friction rub. No gallop.  Pulmonary:     Effort: Pulmonary effort is normal. No respiratory distress.     Breath sounds: Normal breath sounds. No wheezing.  Abdominal:  General: Bowel sounds are normal. There is no distension.     Palpations: Abdomen is soft.     Tenderness: There is no abdominal tenderness.  Musculoskeletal:        General: No swelling or tenderness. Normal range of motion.     Cervical back: Normal range of motion and neck supple.     Right lower leg: No edema.     Left lower leg: No edema.  Skin:    General: Skin is warm and dry.  Neurological:     General: No focal deficit present.     Mental Status: She is alert and oriented to person, place, and time.     ED Results / Procedures / Treatments   Labs (all labs ordered are listed, but only abnormal results are displayed) Labs Reviewed  CBC  PROTIME-INR  BASIC METABOLIC PANEL  TROPONIN I (HIGH SENSITIVITY)    EKG  ED ECG  REPORT   Date: 04/28/2022  Rate: 75  Rhythm: normal sinus rhythm  QRS Axis: left  Intervals: normal  ST/T Wave abnormalities: normal  Conduction Disutrbances:none  Narrative Interpretation:   Old EKG Reviewed: unchanged  I have personally reviewed the EKG tracing and agree with the computerized printout as noted.   Radiology DG Chest 2 View  Result Date: 04/28/2022 CLINICAL DATA:  Left arm pain.  Shortness of breath.  Nausea. EXAM: CHEST - 2 VIEW COMPARISON:  Radiograph 04/18/2020 FINDINGS: Dual lead left-sided pacemaker in place. The heart is normal in size with stable mediastinal contours. No focal airspace disease, pleural effusion, pneumothorax or pulmonary edema. No acute osseous findings. IMPRESSION: No acute chest findings. Electronically Signed   By: Narda Rutherford M.D.   On: 04/28/2022 00:43    Procedures Procedures    Medications Ordered in ED Medications - No data to display  ED Course/ Medical Decision Making/ A&P  This patient presents to the ED for concern of chest and leg pain, this involves an extensive number of treatment options, and is a complaint that carries with it a high risk of complications and morbidity.  The differential diagnosis includes pulmonary embolism, acute coronary syndrome, musculoskeletal etiology   Co morbidities that complicate the patient evaluation  None   Additional history obtained:  No additional history or external records needed   Lab Tests:  I Ordered, and personally interpreted labs.  The pertinent results include: Unremarkable CBC, metabolic panel, and troponin x2   Imaging Studies ordered:  I ordered imaging studies including chest x-ray or a I independently visualized and interpreted imaging which showed no acute findings I agree with the radiologist interpretation   Cardiac Monitoring: / EKG:  The patient was maintained on a cardiac monitor.  I personally viewed and interpreted the cardiac monitored which  showed an underlying rhythm of: Sinus   Consultations Obtained:  No consultations obtained   Problem List / ED Course / Critical interventions / Medication management  Patient presenting with pain in the chest and leg as described in the HPI.  Symptoms resolved while here in the ER.  Patient presentation concerning for pulmonary embolism, however he is already taking Eliquis and symptoms have since resolved.  There is no hypoxia, tachycardia, or other abnormal vital sign that would suggest this.  There is also no evidence for a cardiac etiology as her EKG is unchanged and troponin x2 is negative.  At this point, I feel as though patient can safely be discharged with outpatient follow-up/as needed return. I have reviewed the  patients home medicines and have made adjustments as needed   Social Determinants of Health:  None   Test / Admission - Considered:  No emergent condition identified that would require admission.  Vitals are stable and I feel as though patient can safely be discharged with as needed return.  Final Clinical Impression(s) / ED Diagnoses Final diagnoses:  None    Rx / DC Orders ED Discharge Orders     None         Geoffery Lyons, MD 04/28/22 8299

## 2022-04-28 NOTE — ED Triage Notes (Signed)
Pt POV reports laying in bed appx 2300 tonight and sudden right leg pain. Short time later started have left shoulder pain, ShOB, nausea.   Denies leg pain at time of triage.

## 2022-04-28 NOTE — Discharge Instructions (Signed)
Return to the emergency department if you develop worsening pain, worsening breathing, high fevers, or other new and concerning symptoms.  Continue your medications as previously prescribed.

## 2022-05-19 NOTE — Progress Notes (Signed)
Remote pacemaker transmission.   

## 2022-07-15 ENCOUNTER — Encounter (HOSPITAL_BASED_OUTPATIENT_CLINIC_OR_DEPARTMENT_OTHER): Payer: Self-pay | Admitting: Pediatrics

## 2022-07-15 ENCOUNTER — Emergency Department (HOSPITAL_BASED_OUTPATIENT_CLINIC_OR_DEPARTMENT_OTHER)
Admission: EM | Admit: 2022-07-15 | Discharge: 2022-07-15 | Disposition: A | Discharge status: Home / Self Care | Payer: Medicare Other | Attending: Emergency Medicine | Admitting: Emergency Medicine

## 2022-07-15 ENCOUNTER — Other Ambulatory Visit: Payer: Self-pay

## 2022-07-15 ENCOUNTER — Emergency Department (HOSPITAL_BASED_OUTPATIENT_CLINIC_OR_DEPARTMENT_OTHER): Payer: Medicare Other

## 2022-07-15 DIAGNOSIS — M5441 Lumbago with sciatica, right side: Secondary | ICD-10-CM | POA: Diagnosis not present

## 2022-07-15 DIAGNOSIS — R109 Unspecified abdominal pain: Secondary | ICD-10-CM | POA: Diagnosis not present

## 2022-07-15 DIAGNOSIS — Z7984 Long term (current) use of oral hypoglycemic drugs: Secondary | ICD-10-CM | POA: Insufficient documentation

## 2022-07-15 DIAGNOSIS — M545 Low back pain, unspecified: Secondary | ICD-10-CM | POA: Diagnosis present

## 2022-07-15 DIAGNOSIS — E119 Type 2 diabetes mellitus without complications: Secondary | ICD-10-CM | POA: Insufficient documentation

## 2022-07-15 DIAGNOSIS — E871 Hypo-osmolality and hyponatremia: Secondary | ICD-10-CM | POA: Diagnosis not present

## 2022-07-15 DIAGNOSIS — Z7901 Long term (current) use of anticoagulants: Secondary | ICD-10-CM | POA: Diagnosis not present

## 2022-07-15 LAB — CBC WITH DIFFERENTIAL/PLATELET
Abs Immature Granulocytes: 0.02 10*3/uL (ref 0.00–0.07)
Basophils Absolute: 0 10*3/uL (ref 0.0–0.1)
Basophils Relative: 1 %
Eosinophils Absolute: 0.1 10*3/uL (ref 0.0–0.5)
Eosinophils Relative: 1 %
HCT: 36.6 % (ref 36.0–46.0)
Hemoglobin: 12.6 g/dL (ref 12.0–15.0)
Immature Granulocytes: 0 %
Lymphocytes Relative: 41 %
Lymphs Abs: 2.5 10*3/uL (ref 0.7–4.0)
MCH: 30.4 pg (ref 26.0–34.0)
MCHC: 34.4 g/dL (ref 30.0–36.0)
MCV: 88.4 fL (ref 80.0–100.0)
Monocytes Absolute: 0.4 10*3/uL (ref 0.1–1.0)
Monocytes Relative: 7 %
Neutro Abs: 3 10*3/uL (ref 1.7–7.7)
Neutrophils Relative %: 50 %
Platelets: 238 10*3/uL (ref 150–400)
RBC: 4.14 MIL/uL (ref 3.87–5.11)
RDW: 12.6 % (ref 11.5–15.5)
WBC: 6 10*3/uL (ref 4.0–10.5)
nRBC: 0 % (ref 0.0–0.2)

## 2022-07-15 LAB — COMPREHENSIVE METABOLIC PANEL
ALT: 18 U/L (ref 0–44)
AST: 21 U/L (ref 15–41)
Albumin: 3.9 g/dL (ref 3.5–5.0)
Alkaline Phosphatase: 65 U/L (ref 38–126)
Anion gap: 8 (ref 5–15)
BUN: 15 mg/dL (ref 8–23)
CO2: 28 mmol/L (ref 22–32)
Calcium: 8.9 mg/dL (ref 8.9–10.3)
Chloride: 93 mmol/L — ABNORMAL LOW (ref 98–111)
Creatinine, Ser: 0.9 mg/dL (ref 0.44–1.00)
GFR, Estimated: >60 mL/min (ref >60)
Glucose, Bld: 188 mg/dL — ABNORMAL HIGH (ref 70–99)
Potassium: 3.7 mmol/L (ref 3.5–5.1)
Sodium: 129 mmol/L — ABNORMAL LOW (ref 135–145)
Total Bilirubin: 0.6 mg/dL (ref 0.3–1.2)
Total Protein: 6.9 g/dL (ref 6.5–8.1)

## 2022-07-15 LAB — URINALYSIS, MICROSCOPIC (REFLEX)

## 2022-07-15 LAB — URINALYSIS, ROUTINE W REFLEX MICROSCOPIC
Bilirubin Urine: NEGATIVE
Glucose, UA: 100 mg/dL — AB
Ketones, ur: NEGATIVE mg/dL
Nitrite: NEGATIVE
Protein, ur: NEGATIVE mg/dL
Specific Gravity, Urine: 1.02 (ref 1.005–1.030)
pH: 6 (ref 5.0–8.0)

## 2022-07-15 MED ORDER — METHOCARBAMOL 500 MG PO TABS: 500.0000 mg | ORAL_TABLET | Freq: Two times a day (BID) | ORAL | 0 refills | Status: AC

## 2022-07-15 MED ORDER — LIDOCAINE 5 % EX PTCH: 1.0000 | MEDICATED_PATCH | CUTANEOUS | 0 refills | Status: AC

## 2022-07-15 NOTE — ED Triage Notes (Signed)
C/O of right flank / side / hip pain, tender to touch on flank area. Stated bent over Friday and been having pain since. Takes tylenol, pain patches and has minimum effect. Stated started throwing up Sunday. Painful with movements.

## 2022-07-15 NOTE — ED Provider Notes (Signed)
[facility] Provider Note   CSN: [id number] Arrival date & time: 07/15/22  1026     History  Chief Complaint  Patient presents with   Hip Pain    Brooke Pitts is a 76 y.o. female with past medical history significant for previous low back pain with nerve compression, disc height narrowing, previous uterine cancer that was treated several years ago with hysterectomy, with no evidence of recurrence risks with concern for cute onset right flank, hip, low back pain.  Patient reports that she bent over on Friday has been having pain since, she reports that she felt like something pulled.  She does report some radiation down right leg.  She denies any numbness, tingling, she has been able to walk although is having some pain with walking.  She is trying Tylenol for pain but reports no significant improvement.  She reports that she cannot take ibuprofen secondary to Crohn's, diabetes.  Patient denies any history of IV drug use, recent fever, chronic corticosteroid use.  She denies any difficulty with urination, defecation.  She denies any dysuria, hematuria, history of kidney stones.   Hip Pain       Home Medications Prior to Admission medications   Medication Sig Start Date End Date Taking? Authorizing Provider  lidocaine (LIDODERM) 5 % Place 1 patch onto the skin daily. Remove & Discard patch within 12 hours or as directed by MD 07/15/22  Yes Kiani Wurtzel H, PA-C  methocarbamol (ROBAXIN) 500 MG tablet Take 1 tablet (500 mg total) by mouth 2 (two) times daily. 07/15/22  Yes Liel Rudden H, PA-C  apixaban (ELIQUIS) 5 MG TABS tablet Take 1 tablet by mouth twice daily 12/02/21   Revankar, Reita Cliche, MD  atenolol (TENORMIN) 50 MG tablet Take 1.5 tablets (75 mg total) by mouth 2 (two) times daily. 01/09/22   Revankar, Reita Cliche, MD  b complex vitamins tablet Take 1 tablet by mouth daily.    [provider]  dimenhyDRINATE (DRAMAMINE) 50 MG  tablet Take 50 mg by mouth every 8 (eight) hours as needed for dizziness.    [provider]  irbesartan (AVAPRO) 300 MG tablet Take 300 mg by mouth daily. 06/18/17   [provider]  loratadine (CLARITIN) 10 MG tablet Take 10 mg by mouth daily as needed for allergies or rhinitis.    [provider]  magnesium oxide (MAG-OX) 400 MG tablet Take 400 mg by mouth daily.    [provider]  metFORMIN (GLUCOPHAGE-XR) 500 MG 24 hr tablet Take 500 mg by mouth in the morning. and take 1000 mg in the evening 11/26/20   [provider]  nitroGLYCERIN (NITROSTAT) 0.4 MG SL tablet Place 1 tablet (0.4 mg total) under the tongue every 5 (five) minutes as needed for chest pain. 01/09/22   Revankar, Reita Cliche, MD  Omega-3 Fatty Acids (FISH OIL) 1200 MG CPDR Take 1 capsule by mouth daily.     [provider]      Allergies    Flecainide, Cymbalta [duloxetine hcl], Hydrocodone, Lisinopril, Amlodipine, Losartan, and Nsaids    Review of Systems   Review of Systems  Musculoskeletal:  Positive for back pain.  All other systems reviewed and are negative.   Physical Exam Updated Vital Signs BP (!) 145/77   Pulse 69   Temp 98.2 F (36.8 C) (Oral)   Resp 16   Ht 5' 3.25" (1.607 m)   Wt 101.2 kg   SpO2 100%  BMI 39.19 kg/m  Physical Exam Vitals and nursing note reviewed.  Constitutional:      General: She is not in acute distress.    Appearance: Normal appearance.  HENT:     Head: Normocephalic and atraumatic.  Eyes:     General:        Right eye: No discharge.        Left eye: No discharge.  Cardiovascular:     Rate and Rhythm: Normal rate and regular rhythm.     Heart sounds: No murmur heard.    No friction rub. No gallop.  Pulmonary:     Effort: Pulmonary effort is normal.     Breath sounds: Normal breath sounds.  Abdominal:     General: Bowel sounds are normal.     Palpations: Abdomen is soft.     Comments: No significant abdominal pain, no  CVA tenderness  Musculoskeletal:     Comments: Normal strength bilateral lower extremities, 5 out of 5, normal range of motion at the hip.  She has tenderness palpation over the right lumbar paraspinous muscles lumbar spine without step-off, deformity.  Skin:    General: Skin is warm and dry.     Capillary Refill: Capillary refill takes less than 2 seconds.  Neurological:     Mental Status: She is alert and oriented to person, place, and time.  Psychiatric:        Mood and Affect: Mood normal.        Behavior: Behavior normal.     ED Results / Procedures / Treatments   Labs (all labs ordered are listed, but only abnormal results are displayed) Labs Reviewed  COMPREHENSIVE METABOLIC PANEL - Abnormal; Notable for the following components:      Result Value   Sodium 129 (*)    Chloride 93 (*)    Glucose, Bld 188 (*)    All other components within normal limits  URINALYSIS, ROUTINE W REFLEX MICROSCOPIC - Abnormal; Notable for the following components:   Glucose, UA 100 (*)    Hgb urine dipstick TRACE (*)    Leukocytes,Ua TRACE (*)    All other components within normal limits  URINALYSIS, MICROSCOPIC (REFLEX) - Abnormal; Notable for the following components:   Bacteria, UA FEW (*)    Non Squamous Epithelial PRESENT (*)    All other components within normal limits  CBC WITH DIFFERENTIAL/PLATELET    EKG None  Radiology CT Renal Stone Study  Result Date: 07/15/2022 CLINICAL DATA:  Right flank pain with tenderness to touch right flank area. EXAM: CT ABDOMEN AND PELVIS WITHOUT CONTRAST TECHNIQUE: Multidetector CT imaging of the abdomen and pelvis was performed following the standard protocol without IV contrast. RADIATION DOSE REDUCTION: This exam was performed according to the departmental dose-optimization program which includes automated exposure control, adjustment of the mA and/or kV according to patient size and/or use of iterative reconstruction technique. COMPARISON:   09/01/2019 FINDINGS: Lower chest: Lung bases are clear. Mild cardiomegaly is present as cardiac pacer leads are visualized. Hepatobiliary: Previous cholecystectomy. Liver and biliary tree are normal. Pancreas: Normal. Spleen: Normal. Adrenals/Urinary Tract: Adrenal glands are normal. Kidneys are normal in size without hydronephrosis or nephrolithiasis. No focal mass. Ureters are normal in caliber without evidence of stones. Bladder is unremarkable. Stomach/Bowel: Stomach and small bowel are normal. Appendix is normal. Diverticulosis over the descending colon without active inflammation. Vascular/Lymphatic: Minimal calcified plaque over the abdominal aorta which is normal in caliber. No adenopathy. Reproductive: Previous hysterectomy. Adnexal regions are within  normal. Other: No free fluid or focal inflammatory change. Multiple pelvic phleboliths. Musculoskeletal: Moderate disc space narrowing at the L4-5 and L5-S1 levels. IMPRESSION: 1. No acute findings in the abdomen/pelvis. No evidence of nephrolithiasis or obstruction. 2. Diverticulosis over the descending colon without active inflammation. 3. Aortic atherosclerosis. Aortic Atherosclerosis (ICD10-I70.0). Electronically Signed   By: Elberta Fortis M.D.   On: 07/15/2022 11:47    Procedures Procedures    Medications Ordered in ED Medications - No data to display  ED Course/ Medical Decision Making/ A&P                           Medical Decision Making Amount and/or Complexity of Data Reviewed Labs: ordered. Radiology: ordered.   Patient with back pain.  My emergent differential diagnosis includes slipped disc, compression fracture, spondylolisthesis, less clinical concern for epidural abscess or osteomyelitis based on patient history.  No neurological deficits. Patient is ambulatory. No warning symptoms of back pain including: fecal incontinence, urinary retention or overflow incontinence, night sweats, waking from sleep with back pain, unexplained  fevers or weight loss, IVDU, recent trauma.  Patient does have previous uterine cancer treated with hysterectomy remotely with no evidence of recurrence.  No concern for cauda equina, epidural abscess, or other emergent cause of back pain.  Given this work-up, evaluation, physical exam I do not believe that additional radiographic imaging is indicated at this time.  Conservative measures such as rest, ice/heat, , Tylenol, and  prescription for Robaxin indicated with orthopedic follow-up if no improvement with conservative management.   Given patient with complaint of flank pain as well as 1 isolated episode of vomiting we evaluated her for kidney stone, and ran some lab work as well.  I independently interpreted lab work including CMP, CBC, UA, as well as I independently interpreted imaging including CT renal stone study which shows no dense of kidney stone, patient does have some disc height narrowing in lower lumbar and S1 joint space, which adds to suspicion of low back pain with sciatica. I agree with the radiologist interpretation.  CMP notable for low sodium, chloride, patient reports that she has had problems with this in the past secondary to her many cardiac meds, she is try to increase her dietary intake of sodium, encouraged continuing to do this, as well as adding on electrolyte-containing liquids such as Gatorade, Pedialyte.  Discussed and considered pulse dose of steroids, however patient declines at this time, will follow-up with neurosurgery as needed if no resolution with conservative management as above.  Patient discharged in stable condition at this time.  Return precautions given.  Final Clinical Impression(s) / ED Diagnoses Final diagnoses:  Acute right-sided low back pain with right-sided sciatica  Hyponatremia    Rx / DC Orders ED Discharge Orders          Ordered    methocarbamol (ROBAXIN) 500 MG tablet  2 times daily        07/15/22 1220    lidocaine (LIDODERM) 5 %  Every  24 hours        07/15/22 1220              Blimi Godby, [place] H, PA-C 07/15/22 1229    Virgina Norfolk, DO 07/15/22 1405

## 2022-07-15 NOTE — Discharge Instructions (Addendum)
Please use Tylenol for pain.  You may use 1000 mg of Tylenol every 6 hours.  Not to exceed 4 g of Tylenol within 24 hours.  You can use the muscle relaxant I am prescribing in addition to the above to help with any breakthrough pain.  You can take it up to twice daily.  It is safe to take at night, but I would be cautious taking it during the day as it can cause some drowsiness.  Make sure that you are feeling awake and alert before you get behind the wheel of a car or operate a motor vehicle.  It is not a narcotic pain medication so you are able to take it if it is not making you drowsy and still pilot a vehicle or machinery safely.  As we discussed it can take 2 to 3 weeks before you have resolution of sciatica-like symptoms I recommend that you follow-up with the neurosurgeon for more targeted treatment, possible localized injection of steroids.  If you have significant pain that is not improving with all of the above I recommend trying an oral course of steroids.  I recommend using ice instead of heat in the first few days after this injury as I generally helps with inflammation more than heat, you can switch to heat after around a week or 2.  I have attached some rehab exercises to help with sciatica.  Continue to try to add some sodium to your diet, as well as you may want to drink some electrolyte containing drinks such as Gatorade, Pedialyte to help bring of your sodium.  I recommend following up with your primary care doctor in the next 1 to 2 weeks to ensure that your sodium levels are returning to normal.

## 2022-07-19 ENCOUNTER — Other Ambulatory Visit: Payer: Self-pay | Admitting: Cardiology

## 2022-07-21 ENCOUNTER — Ambulatory Visit (INDEPENDENT_AMBULATORY_CARE_PROVIDER_SITE_OTHER): Payer: Medicare Other

## 2022-07-21 DIAGNOSIS — I495 Sick sinus syndrome: Secondary | ICD-10-CM

## 2022-07-22 LAB — CUP PACEART REMOTE DEVICE CHECK
Battery Remaining Longevity: 22 mo
Battery Voltage: 2.92 V
Brady Statistic AP VP Percent: 0.23 %
Brady Statistic AP VS Percent: 75.03 %
Brady Statistic AS VP Percent: 0.01 %
Brady Statistic AS VS Percent: 24.73 %
Brady Statistic RA Percent Paced: 74.26 %
Brady Statistic RV Percent Paced: 0.23 %
Date Time Interrogation Session: 20231010105343
Implantable Lead Implant Date: 20150114
Implantable Lead Implant Date: 20150114
Implantable Lead Location: 753859
Implantable Lead Location: 753860
Implantable Lead Model: 5076
Implantable Lead Model: 5076
Implantable Pulse Generator Implant Date: 20150114
Lead Channel Impedance Value: 323 Ohm
Lead Channel Impedance Value: 418 Ohm
Lead Channel Impedance Value: 456 Ohm
Lead Channel Impedance Value: 532 Ohm
Lead Channel Pacing Threshold Amplitude: 1 V
Lead Channel Pacing Threshold Amplitude: 1 V
Lead Channel Pacing Threshold Pulse Width: 0.4 ms
Lead Channel Pacing Threshold Pulse Width: 0.4 ms
Lead Channel Sensing Intrinsic Amplitude: 1.875 mV
Lead Channel Sensing Intrinsic Amplitude: 1.875 mV
Lead Channel Sensing Intrinsic Amplitude: 17.75 mV
Lead Channel Sensing Intrinsic Amplitude: 17.75 mV
Lead Channel Setting Pacing Amplitude: 2 V
Lead Channel Setting Pacing Amplitude: 2 V
Lead Channel Setting Pacing Pulse Width: 0.4 ms
Lead Channel Setting Sensing Sensitivity: 0.9 mV

## 2022-08-20 ENCOUNTER — Other Ambulatory Visit: Payer: Self-pay

## 2022-08-20 ENCOUNTER — Ambulatory Visit: Payer: Medicare Other | Attending: Cardiology | Admitting: Cardiology

## 2022-08-20 ENCOUNTER — Encounter: Payer: Self-pay | Admitting: Cardiology

## 2022-08-20 VITALS — BP 148/84 | HR 72 | Ht 64.0 in | Wt 225.1 lb

## 2022-08-20 DIAGNOSIS — I495 Sick sinus syndrome: Secondary | ICD-10-CM

## 2022-08-20 DIAGNOSIS — I10 Essential (primary) hypertension: Secondary | ICD-10-CM

## 2022-08-20 DIAGNOSIS — I4729 Other ventricular tachycardia: Secondary | ICD-10-CM

## 2022-08-20 DIAGNOSIS — R0602 Shortness of breath: Secondary | ICD-10-CM

## 2022-08-20 DIAGNOSIS — G4733 Obstructive sleep apnea (adult) (pediatric): Secondary | ICD-10-CM

## 2022-08-20 DIAGNOSIS — I480 Paroxysmal atrial fibrillation: Secondary | ICD-10-CM

## 2022-08-20 MED ORDER — SPIRONOLACTONE 25 MG PO TABS: 12.5000 mg | ORAL_TABLET | Freq: Every day | ORAL | Status: DC

## 2022-08-20 NOTE — Progress Notes (Signed)
Cardiology Office Note:    Date:  08/20/2022   ID:  Brooke Pitts, DOB 1945-10-22, MRN [id number]  PCP:  Forrestine Him, PA-C  Cardiologist:  Jenean Lindau, MD   Referring MD: Forrestine Him, PA-C    ASSESSMENT:    1. SSS (sick sinus syndrome) ([place])   2. PAF (paroxysmal atrial fibrillation) ([place])   3. NSVT (nonsustained ventricular tachycardia) ([facility])   4. Essential hypertension   5. OSA on CPAP    PLAN:    In order of problems listed above:  Primary prevention stressed with the patient.  Importance of compliance with diet medication stressed and she vocalized understanding.  She was advised to exercise to the best of her ability and she promises to do so. Essential hypertension: Blood pressure is elevated.  Salt intake issues and diet and lifestyle modification urged.  She has been given spironolactone by primary care but has not started it.  She was advised to take 12.5 mg of spironolactone.  She is already keeping track of her pulse and blood pressure and will have follow-up blood pressure visit and blood work with her primary care and she knows about this. Mixed dyslipidemia: Diet was emphasized.  Lifestyle modification urged.  This is followed by primary care. Sick sinus syndrome and permanent pacemaker.  Paroxysmal atrial fibrillation:I discussed with the patient atrial fibrillation, disease process. Management and therapy including rate and rhythm control, anticoagulation benefits and potential risks were discussed extensively with the patient. Patient had multiple questions which were answered to patient's satisfaction.  This is followed by primary care the pacemaker component.  Obesity: Weight reduction stressed and lifestyle modification urged and the risks of obesity explained and patient promises to do better. Shortness of breath on exertion: She seeks referral to pulmonologist and we will do this for her. Patient will be seen in follow-up appointment in 6 months or earlier  if the patient has any concerns    Medication Adjustments/Labs and Tests Ordered: Current medicines are reviewed at length with the patient today.  Concerns regarding medicines are outlined above.  No orders of the defined types were placed in this encounter.  No orders of the defined types were placed in this encounter.    No chief complaint on file.    History of Present Illness:    Brooke Pitts is a 76 y.o. female.  Patient has past medical history of essential hypertension, diabetes mellitus, dyslipidemia, obesity.  She has sleep apnea.  She denies any problems at this time and takes care of activities of daily living.  No chest pain orthopnea or PND.  At the time of my evaluation, the patient is alert awake oriented and in no distress.  Past Medical History:  Diagnosis Date   A-fib [facility])    BPPV (benign paroxysmal positional vertigo) 11/17/2015   CPAP/BiPAP dependence 11/17/2015   Crohn's disease of ileum ([place]) 11/17/2015   Diabetes mellitus due to underlying condition with unspecified complications ([place]) [id number]   Diabetes mellitus without complication ([place])    Disorder of iron metabolism 11/17/2015   Essential hypertension 10/03/2015   GERD (gastroesophageal reflux disease) 11/03/2015   Hallux rigidus, left foot 01/03/2022   History of cataract 11/17/2015   History of CVA (cerebrovascular accident) 10/18/2015   Overview:  May 2014 or 2015  Formatting of this note might be different from the original. Overview:  Overview:  May 2014 or 2015 Formatting of this note might be different from the original. May 2014 or 2015  History of osteopenia 11/17/2015   Hyperlipidemia LDL goal <100 10/03/2015   Overview:  Multi statin intolerant  Formatting of this note might be different from the original. Overview:  Overview:  Multi statin intolerant Formatting of this note might be different from the original. Multi statin intolerant. Statin intolerance will observe. No need to draw lipid panel in  future.   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 Unsteadiness. Acute exacerbation of chronic problem. See history of present illness.  Last 2 weeks has been an issue.  But she has had this on and off for years.  Dramamine helps some.  Primarily unsteady which is worse with change in head position.  Has lasted for days at a time. EXAM shows normal external cana   Vitamin D deficiency 12/03/2015    Past Surgical History:  Procedure Laterality Date   ABDOMINAL HYSTERECTOMY     BILATERAL OOPHORECTOMY     CHOLECYSTECTOMY     PACEMAKER INSERTION     TONSILLECTOMY      Current Medications: Current Meds  Medication Sig   amLODipine (NORVASC) 2.5 MG tablet Take 2.5 mg by mouth daily.   apixaban (ELIQUIS) 5 MG TABS tablet Take 1 tablet by mouth twice daily   atenolol (TENORMIN) 50 MG tablet Take 1.5 tablets (75 mg total) by mouth 2 (two) times daily.   b complex vitamins tablet Take 1 tablet by mouth daily.   dimenhyDRINATE (DRAMAMINE) 50 MG tablet Take 50 mg by mouth every 8 (eight) hours as needed for dizziness.   irbesartan (AVAPRO) 300 MG tablet Take 300 mg by mouth daily.   lidocaine (LIDODERM) 5 % Place 1 patch onto the skin daily. Remove &  Discard patch within 12 hours or as directed by MD   loratadine (CLARITIN) 10 MG tablet Take 10 mg by mouth daily as needed for allergies or rhinitis.   magnesium oxide (MAG-OX) 400 MG tablet Take 400 mg by mouth daily.   metFORMIN (GLUCOPHAGE-XR) 500 MG 24 hr tablet Take 500 mg by mouth in the morning. and take 1000 mg in the evening   nitroGLYCERIN (NITROSTAT) 0.4 MG SL tablet Place 1 tablet (0.4 mg total) under the tongue every 5 (five) minutes as needed for chest pain.   Omega-3 Fatty Acids (FISH OIL) 1200 MG CPDR Take 1 capsule by mouth daily.      Allergies:   Flecainide, Cymbalta [duloxetine hcl], Hydrocodone, Lisinopril, Amlodipine, Losartan, and Nsaids   Social History   Socioeconomic History   Marital status: Married    Spouse name: Not on file   Number of children: Not on file   Years of education: Not on file   Highest education level: Not on file  Occupational History   Not on file  Tobacco Use   Smoking status: Never   Smokeless tobacco: Never  Vaping Use   Vaping Use: Never used  Substance and Sexual Activity   Alcohol use: No   Drug use: No   Sexual activity: Not on file  Other Topics Concern   Not on file  Social History Narrative   Not on file   Social Determinants of Health   Financial Resource Strain: Not on file  Food Insecurity: Not on file  Transportation Needs: Not on file  Physical Activity: Not on file  Stress: Not on file  Social Connections: Not on file     Family History: The patient's family history includes Arrhythmia in her mother; Atrial fibrillation in her mother; Heart disease in her brother; Heart failure in her brother.  ROS:   Please see the history of present illness.    All other systems reviewed and are negative.  EKGs/Labs/Other Studies Reviewed:    The following studies were reviewed today: EKG reveals paced atrial rhythm and nonspecific ST-T changes for inferior and anterior forces.   Recent Labs: 07/15/2022: ALT 18;  BUN 15; Creatinine, Ser 0.90; Hemoglobin 12.6; Platelets 238; Potassium 3.7; Sodium 129  Recent Lipid Panel    Component Value Date/Time   CHOL 181 07/05/2019 1011   TRIG 166 (H) 07/05/2019 1011   HDL 55 07/05/2019 1011   CHOLHDL 3.3 07/05/2019 1011   LDLCALC 97 07/05/2019 1011    Physical Exam:    VS:  BP (!) 148/84   Pulse 72   Ht 5\' 4"  (1.626 m)   Wt 225 lb 1.9 oz (102.1 kg)   SpO2 98%   BMI 38.64 kg/m     Wt Readings from Last 3 Encounters:  08/20/22 225 lb 1.9 oz (102.1 kg)  07/15/22 223 lb (101.2 kg)  04/28/22 223 lb (101.2 kg)     GEN: Patient is in no acute distress HEENT: Normal NECK: No JVD; No carotid bruits LYMPHATICS: No lymphadenopathy CARDIAC: Hear sounds regular, 2/6 systolic murmur at the apex. RESPIRATORY:  Clear to auscultation without rales, wheezing or rhonchi  ABDOMEN: Soft, non-tender, non-distended MUSCULOSKELETAL:  No edema; No deformity  SKIN: Warm and dry NEUROLOGIC:  Alert and oriented x 3 PSYCHIATRIC:  Normal affect   Signed, Jenean Lindau, MD  08/20/2022 2:32 PM    [facility]

## 2022-08-20 NOTE — Patient Instructions (Signed)
Medication Instructions:  Your physician has recommended you make the following change in your medication:   Decrease your Spironolactone to 12.5 mg daily. Take 1/2 tablet.  *If you need a refill on your cardiac medications before your next appointment, please call your pharmacy*   Lab Work: None ordered If you have labs (blood work) drawn today and your tests are completely normal, you will receive your results only by: MyChart Message (if you have MyChart) OR A paper copy in the mail If you have any lab test that is abnormal or we need to change your treatment, we will call you to review the results.   Testing/Procedures: None ordered   Follow-Up: At [facility], you and your health needs are our priority.  As part of our continuing mission to provide you with exceptional heart care, we have created designated Provider Care Teams.  These Care Teams include your primary Cardiologist (physician) and Advanced Practice Providers (APPs -  Physician Assistants and Nurse Practitioners) who all work together to provide you with the care you need, when you need it.  We recommend signing up for the patient portal called "MyChart".  Sign up information is provided on this After Visit Summary.  MyChart is used to connect with patients for Virtual Visits (Telemedicine).  Patients are able to view lab/test results, encounter notes, upcoming appointments, etc.  Non-urgent messages can be sent to your provider as well.   To learn more about what you can do with MyChart, go to [email or web address].    Your next appointment:   9 month(s)  The format for your next appointment:   In Person  Provider:   Belva Crome, MD   Other Instructions NA

## 2022-08-21 DIAGNOSIS — I4891 Unspecified atrial fibrillation: Secondary | ICD-10-CM | POA: Insufficient documentation

## 2022-08-21 HISTORY — DX: Unspecified atrial fibrillation: I48.91

## 2022-08-22 NOTE — Progress Notes (Signed)
Remote pacemaker transmission.   

## 2022-08-25 ENCOUNTER — Telehealth: Payer: Self-pay | Admitting: Cardiology

## 2022-08-25 NOTE — Telephone Encounter (Signed)
Patient states that she needs  hospital f/u appt. Explain to patient dr doesn't have availability till 1/11. Please advise

## 2022-08-25 NOTE — Telephone Encounter (Signed)
LVM on both home and mobile- [facility] has availability in [place] next week- which is where patient is est.  Will call back in two days/kbl 08/25/22

## 2022-09-01 ENCOUNTER — Ambulatory Visit: Payer: Medicare Other | Admitting: Cardiology

## 2022-09-10 ENCOUNTER — Ambulatory Visit (INDEPENDENT_AMBULATORY_CARE_PROVIDER_SITE_OTHER): Payer: Medicare Other | Admitting: Pulmonary Disease

## 2022-09-10 ENCOUNTER — Encounter: Payer: Self-pay | Admitting: Pulmonary Disease

## 2022-09-10 VITALS — BP 146/78 | HR 76 | Temp 98.2°F | Ht 64.5 in | Wt 225.2 lb

## 2022-09-10 DIAGNOSIS — R062 Wheezing: Secondary | ICD-10-CM

## 2022-09-10 DIAGNOSIS — R0689 Other abnormalities of breathing: Secondary | ICD-10-CM

## 2022-09-10 DIAGNOSIS — G4733 Obstructive sleep apnea (adult) (pediatric): Secondary | ICD-10-CM | POA: Diagnosis not present

## 2022-09-10 NOTE — Patient Instructions (Signed)
Schedule you for pulmonary function test  Make an appointment to see air nose and throat -Noisy breathing, past history of requiring a breathing tube for surgery -No past history of lung disease -Noisy breathing/wheezing at rest may mean vocal cord problems  We will schedule you for a home sleep study -I will want to wait on this until you have your next visit to see whether your provider can help [place] moving forward  -A home sleep study to diagnose severity of sleep disordered breathing and then with that result, we can send a prescription to the medical supply company for CPAP therapy  Continue your current machine  Kindly call us/MyChart message after you have your next visit on December 6 to let us know whether we should go ahead with a sleep study

## 2022-09-10 NOTE — Progress Notes (Signed)
Brooke Pitts    [id number]    01-30-1946  Primary Care Physician:Orias, Celine Mans  Referring Physician: Garwin Brothers, MD [address] [place] ,  [place] [address]  Chief complaint:    Patient with known obstructive sleep apnea Noisy breathing  HPI:  Patient with known obstructive sleep apnea on CPAP therapy Compliant with CPAP therapy  Has been told about noisy breathing Occurs at rest Occurs without exertion, describes this as wheezing No predilection for any particular time of day She did have surgery in 2022, was intubated  She does describe some shortness of breath on exertion  Has a history of atrial fibrillation, sick sinus syndrome, hypertension, diabetes, dyslipidemia Pets: Occupation: Exposures: Smoking history: Travel history: Relevant family history:  Outpatient Encounter Medications as of 09/10/2022  Medication Sig  . amLODipine (NORVASC) 2.5 MG tablet Take 2.5 mg by mouth daily.  Marland Kitchen apixaban (ELIQUIS) 5 MG TABS tablet Take 1 tablet by mouth twice daily  . atenolol (TENORMIN) 50 MG tablet Take 1.5 tablets (75 mg total) by mouth 2 (two) times daily.  Marland Kitchen b complex vitamins tablet Take 1 tablet by mouth daily.  Marland Kitchen dimenhyDRINATE (DRAMAMINE) 50 MG tablet Take 50 mg by mouth every 8 (eight) hours as needed for dizziness.  . irbesartan (AVAPRO) 300 MG tablet Take 300 mg by mouth daily.  Marland Kitchen lidocaine (LIDODERM) 5 % Place 1 patch onto the skin daily. Remove & Discard patch within 12 hours or as directed by MD  . loratadine (CLARITIN) 10 MG tablet Take 10 mg by mouth daily as needed for allergies or rhinitis.  . magnesium oxide (MAG-OX) 400 MG tablet Take 400 mg by mouth daily.  . metFORMIN (GLUCOPHAGE-XR) 500 MG 24 hr tablet Take 500 mg by mouth in the morning. and take 1000 mg in the evening  . methocarbamol (ROBAXIN) 500 MG tablet Take 1 tablet (500 mg total) by mouth 2 (two) times daily.  . nitroGLYCERIN (NITROSTAT) 0.4 MG SL  tablet Place 1 tablet (0.4 mg total) under the tongue every 5 (five) minutes as needed for chest pain.  . Omega-3 Fatty Acids (FISH OIL) 1200 MG CPDR Take 1 capsule by mouth daily.   Marland Kitchen spironolactone (ALDACTONE) 25 MG tablet Take 0.5 tablets (12.5 mg total) by mouth daily.   No facility-administered encounter medications on file as of 09/10/2022.    Allergies as of 09/10/2022 - Review Complete 09/10/2022  Allergen Reaction Noted  . Flecainide Shortness Of Breath 05/03/2019  . Cymbalta [duloxetine hcl]  03/06/2014  . Hydrocodone  03/06/2014  . Lisinopril  03/06/2014  . Amlodipine Other (See Comments) 11/02/2015  . Losartan Other (See Comments) 10/03/2015  . Nsaids Nausea Only 10/03/2015    Past Medical History:  Diagnosis Date  . A-fib ([facility])   . Atrial fibrillation with rapid ventricular response (HCC) 08/21/2022  . BPPV (benign paroxysmal positional vertigo) 11/17/2015  . CPAP/BiPAP dependence 11/17/2015  . Crohn's disease of ileum (HCC) 11/17/2015  . Diabetes mellitus due to underlying condition with unspecified complications (HCC) 03/04/2019  . Diabetes mellitus without complication (HCC)   . 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 Essential hypertension 10/03/2015  . GERD (gastroesophageal reflux disease) 11/03/2015  . Hallux rigidus, left foot 01/03/2022  . History of cataract 11/17/2015  . History of CVA (cerebrovascular accident) 10/18/2015   Overview:  May 2014 or 2015  Formatting of this note might be different from the original. Overview:  Overview:  May 2014 or 2015 Formatting of this note might be different from the original. May 2014 or 2015  . History of osteopenia 11/17/2015  . Hyperlipidemia LDL goal <100 10/03/2015   Overview:  Multi statin intolerant  Formatting of this note might be different from the original. Overview:  Overview:  Multi statin intolerant Formatting of this note might be different from the original. Multi statin intolerant. Statin intolerance  will observe. No need to draw lipid panel in future.  . Hypertension   . Hypokalemia 06/30/2016  . Hypomagnesemia 07/08/2016  . Localized edema 09/20/2018  . Long term current use of anticoagulant therapy 10/18/2015  . Low magnesium level 07/08/2016  . NSVT (nonsustained ventricular tachycardia) (HCC) 01/27/2019  . Obesity (BMI 30.0-34.9) 09/18/2016  . OSA on CPAP 11/17/2015  . Pacemaker reprogramming/check 11/20/2015  . PAF (paroxysmal atrial fibrillation) (HCC) 01/27/2018  . Preoperative cardiovascular examination 08/10/2019  . 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 Risk for falls 08/21/2016  . S/P placement of cardiac pacemaker 11/17/2015  . SSS (sick sinus syndrome) (HCC) 11/20/2015  . Statin intolerance 09/20/2018  . Stroke (HCC)   . Type 2 diabetes mellitus (HCC) 11/02/2015  . Unsteadiness 08/26/2021   Last Assessment & Plan:  Formatting of this note might be different from the original. Unsteadiness. Acute exacerbation of chronic problem. See history of present illness.  Last 2 weeks has been an issue.  But she has had this on and off for years.  Dramamine helps some.  Primarily unsteady which is worse with change in head position.  Has lasted for days at a time. EXAM shows normal external cana  . Vitamin D deficiency 12/03/2015    Past Surgical History:  Procedure Laterality Date  . ABDOMINAL HYSTERECTOMY    . BILATERAL OOPHORECTOMY    . CHOLECYSTECTOMY    . PACEMAKER INSERTION    . TONSILLECTOMY      Family History  Problem Relation Age of Onset  . Arrhythmia Mother   . Atrial fibrillation Mother   . Heart disease Brother   . Heart failure Brother     Social History   Socioeconomic History  . Marital status: Married    Spouse name: Not on file  . Number of children: Not on file  . Years of education: Not on file  . Highest education level: Not on file  Occupational History  . Not on file  Tobacco Use  . Smoking status: Never  . Smokeless tobacco:  Never  Vaping Use  . Vaping Use: Never used  Substance and Sexual Activity  . Alcohol use: No  . Drug use: No  . Sexual activity: Not on file  Other Topics Concern  . Not on file  Social History Narrative  . Not on file   Social Determinants of Health   Financial Resource Strain: Not on file  Food Insecurity: Not on file  Transportation Needs: Not on file  Physical Activity: Not on file  Stress: Not on file  Social Connections: Not on file  Intimate Partner Violence: Not on file    Review of Systems  Respiratory:  Positive for apnea, shortness of breath and wheezing.   Psychiatric/Behavioral:  Positive for sleep disturbance.     Vitals:   09/10/22 1518  BP: (!) 146/78  Pulse: 76  Temp: 98.2 F (36.8 C)  SpO2: 100%     Physical Exam Constitutional:      Appearance: She is obese.  HENT:     Head: Normocephalic.  Mouth/Throat:     Mouth: Mucous membranes are moist.  Eyes:     Conjunctiva/sclera: Conjunctivae normal.  Cardiovascular:     Rate and Rhythm: Normal rate and regular rhythm.     Heart sounds: No murmur heard.    No friction rub.  Pulmonary:     Effort: No respiratory distress.     Breath sounds: No stridor. No wheezing or rhonchi.  Musculoskeletal:     Cervical back: No rigidity or tenderness.  Neurological:     Mental Status: She is alert.  Psychiatric:        Mood and Affect: Mood normal.     Data Reviewed: ***  Assessment:  ***  Plan/Recommendations: ***   Virl Diamond MD [facility] 09/10/2022, 3:57 PM  CC: Revankar, Aundra Dubin, MD

## 2022-09-16 ENCOUNTER — Encounter: Payer: Self-pay | Admitting: Pulmonary Disease

## 2022-09-26 ENCOUNTER — Ambulatory Visit: Payer: Medicare Other | Attending: Cardiology | Admitting: Cardiology

## 2022-09-26 ENCOUNTER — Encounter: Payer: Self-pay | Admitting: Cardiology

## 2022-09-26 DIAGNOSIS — I10 Essential (primary) hypertension: Secondary | ICD-10-CM | POA: Diagnosis not present

## 2022-09-26 DIAGNOSIS — I495 Sick sinus syndrome: Secondary | ICD-10-CM | POA: Diagnosis not present

## 2022-09-26 DIAGNOSIS — I480 Paroxysmal atrial fibrillation: Secondary | ICD-10-CM | POA: Diagnosis not present

## 2022-09-26 DIAGNOSIS — G4733 Obstructive sleep apnea (adult) (pediatric): Secondary | ICD-10-CM

## 2022-09-26 DIAGNOSIS — I4729 Other ventricular tachycardia: Secondary | ICD-10-CM | POA: Diagnosis not present

## 2022-09-26 DIAGNOSIS — Z950 Presence of cardiac pacemaker: Secondary | ICD-10-CM

## 2022-09-26 NOTE — Patient Instructions (Signed)
Medication Instructions:  Your physician recommends that you continue on your current medications as directed. Please refer to the Current Medication list given to you today.  *If you need a refill on your cardiac medications before your next appointment, please call your pharmacy*   Lab Work: None If you have labs (blood work) drawn today and your tests are completely normal, you will receive your results only by: MyChart Message (if you have MyChart) OR A paper copy in the mail If you have any lab test that is abnormal or we need to change your treatment, we will call you to review the results.   Testing/Procedures: None   Follow-Up: At [facility], you and your health needs are our priority.  As part of our continuing mission to provide you with exceptional heart care, we have created designated Provider Care Teams.  These Care Teams include your primary Cardiologist (physician) and Advanced Practice Providers (APPs -  Physician Assistants and Nurse Practitioners) who all work together to provide you with the care you need, when you need it.  We recommend signing up for the patient portal called "MyChart".  Sign up information is provided on this After Visit Summary.  MyChart is used to connect with patients for Virtual Visits (Telemedicine).  Patients are able to view lab/test results, encounter notes, upcoming appointments, etc.  Non-urgent messages can be sent to your provider as well.   To learn more about what you can do with MyChart, go to [email or web address].    Your next appointment:   9 month(s)  The format for your next appointment:   In Person  Provider:   Rajan Revankar, MD    Other Instructions None  Important Information About Sugar       

## 2022-09-26 NOTE — Progress Notes (Signed)
Cardiology Office Note:    Date:  09/26/2022   ID:  Brooke Pitts, DOB 04-24-1946, MRN [id number]  PCP:  Mendel Ryder, PA-C  Cardiologist:  Garwin Brothers, MD   Referring MD: Mendel Ryder, PA-C    ASSESSMENT:    1. PAF (paroxysmal atrial fibrillation) (HCC)   2. SSS (sick sinus syndrome) (HCC)   3. NSVT (nonsustained ventricular tachycardia) (HCC)   4. Essential hypertension   5. OSA on CPAP   6. S/P placement of cardiac pacemaker    PLAN:    In order of problems listed above:  Primary prevention stressed with the patient.  Importance of compliance with diet medication stressed and she vocalized understanding.  She was advised to at least walk half an hour a day 5 days a week and she promises to do so. Paroxysmal atrial fibrillation:I discussed with the patient atrial fibrillation, disease process. Management and therapy including rate and rhythm control, anticoagulation benefits and potential risks were discussed extensively with the patient. Patient had multiple questions which were answered to patient's satisfaction. Essential hypertension: Blood pressure stable and diet was emphasized.  Her blood pressure is in the range of 130/70 at home and she is meticulous about her blood pressure.  She has an element of whitecoat hypertension.  Diet and salt intake issues and lifestyle modification was discussed Mixed dyslipidemia: Lipids reviewed.  Diet managed by primary care. Diabetes mellitus Obesity: Lifestyle modification urged and she promises to do better. Patient will be seen in follow-up appointment in 9 months or earlier if the patient has any concerns    Medication Adjustments/Labs and Tests Ordered: Current medicines are reviewed at length with the patient today.  Concerns regarding medicines are outlined above.  No orders of the defined types were placed in this encounter.  No orders of the defined types were placed in this encounter.    No chief complaint on  file.    History of Present Illness:    Brooke Pitts is a 76 y.o. female.  Patient has past medical history of paroxysmal atrial fibrillation, essential hypertension, dyslipidemia, diabetes mellitus and obesity.  She denies any problems at this time and takes care of activities of daily living.  No chest pain orthopnea or PND.  She has had issues with paroxysms of atrial fibrillation in the past.  She tells me these have settled down.  She is pretty meticulous about anticoagulation.  At the time of my evaluation, the patient is alert awake oriented and in no distress.  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 Overview:  Overview:  May 2014 or 2015 Formatting of this note might be different from the original. May 2014 or 2015   History of osteopenia 11/17/2015   Hyperlipidemia LDL goal <100 10/03/2015   Overview:  Multi statin intolerant  Formatting of  this note might be different from the original. Overview:  Overview:  Multi statin intolerant Formatting of this note might be different from the original. Multi statin intolerant. Statin intolerance will observe. No need to draw lipid panel in future.   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 Unsteadiness. Acute exacerbation of chronic problem. See history of present illness.  Last 2 weeks has been an issue.  But she has had this on and off for years.  Dramamine helps some.  Primarily unsteady which is worse with change in head position.  Has lasted for days at a time. EXAM shows normal external cana   Vitamin D deficiency 12/03/2015    Past Surgical History:  Procedure Laterality Date   ABDOMINAL HYSTERECTOMY     BILATERAL OOPHORECTOMY     CHOLECYSTECTOMY     PACEMAKER INSERTION     TONSILLECTOMY      Current Medications: Current Meds  Medication Sig   apixaban (ELIQUIS) 5 MG TABS tablet Take 1 tablet by mouth twice daily   atenolol (TENORMIN) 50 MG tablet Take 1.5 tablets (75 mg total) by mouth 2 (two) times daily.   b complex vitamins tablet Take 1 tablet by mouth daily.   dimenhyDRINATE (DRAMAMINE) 50 MG tablet Take 50 mg by mouth every 8 (eight) hours as needed for dizziness.   estradiol (ESTRACE) 0.1 MG/GM vaginal cream Place 1 Applicatorful vaginally daily.   irbesartan (AVAPRO) 300 MG tablet Take 300 mg by mouth daily.   lidocaine (LIDODERM) 5 % Place 1 patch onto the skin daily. Remove & Discard patch within 12 hours or  as directed by MD   loratadine (CLARITIN) 10 MG tablet Take 10 mg by mouth daily as needed for allergies or rhinitis.   magnesium oxide (MAG-OX) 400 MG tablet Take 400 mg by mouth daily.   metFORMIN (GLUCOPHAGE-XR) 500 MG 24 hr tablet Take 500 mg by mouth in the morning. and take 1000 mg in the evening   nitroGLYCERIN (NITROSTAT) 0.4 MG SL tablet Place 1 tablet (0.4 mg total) under the tongue every 5 (five) minutes as needed for chest pain.   Omega-3 Fatty Acids (FISH OIL) 1200 MG CPDR Take 1 capsule by mouth daily.    spironolactone (ALDACTONE) 25 MG tablet Take 0.5 tablets (12.5 mg total) by mouth daily.     Allergies:   Flecainide, Cymbalta [duloxetine hcl], Hydrocodone, Lisinopril, Amlodipine, Losartan, and Nsaids   Social History   Socioeconomic History   Marital status: Married    Spouse name: Not on file   Number of children: Not on file   Years of education: Not on file   Highest education level: Not on file  Occupational History   Not on file  Tobacco  Use   Smoking status: Never   Smokeless tobacco: Never  Vaping Use   Vaping Use: Never used  Substance and Sexual Activity   Alcohol use: No   Drug use: No   Sexual activity: Not on file  Other Topics Concern   Not on file  Social History Narrative   Not on file   Social Determinants of Health   Financial Resource Strain: Not on file  Food Insecurity: Not on file  Transportation Needs: Not on file  Physical Activity: Not on file  Stress: Not on file  Social Connections: Not on file     Family History: The patient's family history includes Arrhythmia in her mother; Atrial fibrillation in her mother; Heart disease in her brother; Heart failure in her brother.  ROS:   Please see the history of present illness.    All other systems reviewed and are negative.  EKGs/Labs/Other Studies Reviewed:    The following studies were reviewed today: I discussed my findings with the patient at length.   Recent  Labs: 07/15/2022: ALT 18; BUN 15; Creatinine, Ser 0.90; Hemoglobin 12.6; Platelets 238; Potassium 3.7; Sodium 129  Recent Lipid Panel    Component Value Date/Time   CHOL 181 07/05/2019 1011   TRIG 166 (H) 07/05/2019 1011   HDL 55 07/05/2019 1011   CHOLHDL 3.3 07/05/2019 1011   LDLCALC 97 07/05/2019 1011    Physical Exam:    VS:  There were no vitals taken for this visit.    Wt Readings from Last 3 Encounters:  09/10/22 225 lb 3.2 oz (102.2 kg)  08/20/22 225 lb 1.9 oz (102.1 kg)  07/15/22 223 lb (101.2 kg)     GEN: Patient is in no acute distress HEENT: Normal NECK: No JVD; No carotid bruits LYMPHATICS: No lymphadenopathy CARDIAC: Hear sounds regular, 2/6 systolic murmur at the apex. RESPIRATORY:  Clear to auscultation without rales, wheezing or rhonchi  ABDOMEN: Soft, non-tender, non-distended MUSCULOSKELETAL:  No edema; No deformity  SKIN: Warm and dry NEUROLOGIC:  Alert and oriented x 3 PSYCHIATRIC:  Normal affect   Signed, Garwin Brothers, MD  09/26/2022 1:22 PM    [facility]

## 2022-09-29 DIAGNOSIS — M791 Myalgia, unspecified site: Secondary | ICD-10-CM | POA: Insufficient documentation

## 2022-09-29 HISTORY — DX: Myalgia, unspecified site: M79.10

## 2022-10-09 DIAGNOSIS — J370 Chronic laryngitis: Secondary | ICD-10-CM | POA: Insufficient documentation

## 2022-10-09 HISTORY — DX: Chronic laryngitis: J37.0

## 2022-10-20 ENCOUNTER — Ambulatory Visit (INDEPENDENT_AMBULATORY_CARE_PROVIDER_SITE_OTHER): Payer: Medicare Other

## 2022-10-20 DIAGNOSIS — I495 Sick sinus syndrome: Secondary | ICD-10-CM

## 2022-10-21 LAB — CUP PACEART REMOTE DEVICE CHECK
Battery Remaining Longevity: 18 mo
Battery Voltage: 2.91 V
Brady Statistic AP VP Percent: 0.11 %
Brady Statistic AP VS Percent: 71.33 %
Brady Statistic AS VP Percent: 0.01 %
Brady Statistic AS VS Percent: 28.54 %
Brady Statistic RA Percent Paced: 70.44 %
Brady Statistic RV Percent Paced: 0.12 %
Date Time Interrogation Session: 20240108121055
Implantable Lead Connection Status: 753985
Implantable Lead Connection Status: 753985
Implantable Lead Implant Date: 20150114
Implantable Lead Implant Date: 20150114
Implantable Lead Location: 753859
Implantable Lead Location: 753860
Implantable Lead Model: 5076
Implantable Lead Model: 5076
Implantable Pulse Generator Implant Date: 20150114
Lead Channel Impedance Value: 304 Ohm
Lead Channel Impedance Value: 380 Ohm
Lead Channel Impedance Value: 437 Ohm
Lead Channel Impedance Value: 494 Ohm
Lead Channel Pacing Threshold Amplitude: 0.75 V
Lead Channel Pacing Threshold Amplitude: 0.875 V
Lead Channel Pacing Threshold Pulse Width: 0.4 ms
Lead Channel Pacing Threshold Pulse Width: 0.4 ms
Lead Channel Sensing Intrinsic Amplitude: 1.75 mV
Lead Channel Sensing Intrinsic Amplitude: 1.75 mV
Lead Channel Sensing Intrinsic Amplitude: 16 mV
Lead Channel Sensing Intrinsic Amplitude: 16 mV
Lead Channel Setting Pacing Amplitude: 1.75 V
Lead Channel Setting Pacing Amplitude: 2 V
Lead Channel Setting Pacing Pulse Width: 0.4 ms
Lead Channel Setting Sensing Sensitivity: 0.9 mV
Zone Setting Status: 755011
Zone Setting Status: 755011

## 2022-10-31 ENCOUNTER — Telehealth: Payer: Self-pay | Admitting: Cardiology

## 2022-10-31 NOTE — Telephone Encounter (Signed)
Stanton Kidney, RN 10/31/2022  3:18 PM EST Back to Top    Pt scheduled to see Dr. Curt Bears on Monday in the [facility] for overdue follow up and further discuss recommendation. Pt agreeable to plan.   (This in reference to last PPM transmission)

## 2022-10-31 NOTE — Telephone Encounter (Signed)
Pt is returning call in regards to results. Requesting return call.

## 2022-11-03 ENCOUNTER — Encounter: Payer: Self-pay | Admitting: Cardiology

## 2022-11-03 ENCOUNTER — Ambulatory Visit: Payer: Medicare Other | Attending: Cardiology | Admitting: Cardiology

## 2022-11-03 VITALS — BP 128/84 | HR 85 | Ht 64.5 in | Wt 223.0 lb

## 2022-11-03 DIAGNOSIS — I480 Paroxysmal atrial fibrillation: Secondary | ICD-10-CM

## 2022-11-03 DIAGNOSIS — D6869 Other thrombophilia: Secondary | ICD-10-CM

## 2022-11-03 DIAGNOSIS — I495 Sick sinus syndrome: Secondary | ICD-10-CM

## 2022-11-03 DIAGNOSIS — I10 Essential (primary) hypertension: Secondary | ICD-10-CM

## 2022-11-03 LAB — CUP PACEART INCLINIC DEVICE CHECK
Battery Remaining Longevity: 19 mo
Battery Voltage: 2.91 V
Brady Statistic AP VP Percent: 0.33 %
Brady Statistic AP VS Percent: 78.46 %
Brady Statistic AS VP Percent: 0.01 %
Brady Statistic AS VS Percent: 21.2 %
Brady Statistic RA Percent Paced: 77.69 %
Brady Statistic RV Percent Paced: 0.33 %
Date Time Interrogation Session: 20240122165239
Implantable Lead Connection Status: 753985
Implantable Lead Connection Status: 753985
Implantable Lead Implant Date: 20150114
Implantable Lead Implant Date: 20150114
Implantable Lead Location: 753859
Implantable Lead Location: 753860
Implantable Lead Model: 5076
Implantable Lead Model: 5076
Implantable Pulse Generator Implant Date: 20150114
Lead Channel Impedance Value: 323 Ohm
Lead Channel Impedance Value: 399 Ohm
Lead Channel Impedance Value: 456 Ohm
Lead Channel Impedance Value: 532 Ohm
Lead Channel Pacing Threshold Amplitude: 0.75 V
Lead Channel Pacing Threshold Amplitude: 0.875 V
Lead Channel Pacing Threshold Pulse Width: 0.4 ms
Lead Channel Pacing Threshold Pulse Width: 0.4 ms
Lead Channel Sensing Intrinsic Amplitude: 1.75 mV
Lead Channel Sensing Intrinsic Amplitude: 16.625 mV
Lead Channel Sensing Intrinsic Amplitude: 19.75 mV
Lead Channel Sensing Intrinsic Amplitude: 2 mV
Lead Channel Setting Pacing Amplitude: 1.75 V
Lead Channel Setting Pacing Amplitude: 2 V
Lead Channel Setting Pacing Pulse Width: 0.4 ms
Lead Channel Setting Sensing Sensitivity: 0.9 mV
Zone Setting Status: 755011
Zone Setting Status: 755011

## 2022-11-03 NOTE — Patient Instructions (Signed)
Medication Instructions:  Your physician recommends that you continue on your current medications as directed. Please refer to the Current Medication list given to you today.  *If you need a refill on your cardiac medications before your next appointment, please call your pharmacy*   Lab Work: None ordered If you have labs (blood work) drawn today and your tests are completely normal, you will receive your results only by: MyChart Message (if you have MyChart) OR A paper copy in the mail If you have any lab test that is abnormal or we need to change your treatment, we will call you to review the results.   Testing/Procedures: None ordered   Follow-Up: At [facility], you and your health needs are our priority.  As part of our continuing mission to provide you with exceptional heart care, we have created designated Provider Care Teams.  These Care Teams include your primary Cardiologist (physician) and Advanced Practice Providers (APPs -  Physician Assistants and Nurse Practitioners) who all work together to provide you with the care you need, when you need it.  Your next appointment:   6 month(s)  The format for your next appointment:   In Person  Provider:   Loman Brooklyn, MD    Thank you for choosing [facility]!!   Dory Horn, RN [phone]  Other Instructions   Flecainide Tablets What is this medication? FLECAINIDE (FLEK a nide) prevents and treats a fast or irregular heartbeat (arrhythmia). It is often used to treat a type of arrhythmia known as AFib (atrial fibrillation). It works by slowing down overactive electric signals in the heart, which stabilizes your heart rhythm. It belongs to a group of medications called antiarrhythmics. This medicine may be used for other purposes; ask your health care provider or pharmacist if you have questions. COMMON BRAND NAME(S): Tambocor What should I tell my care team before I take this medication? They need to know  if you have any of these conditions: High or low levels of potassium in the blood Heart disease including heart rhythm and heart rate problems Kidney disease Liver disease Recent heart attack An unusual or allergic reaction to flecainide, other medications, foods, dyes, or preservatives Pregnant or trying to get pregnant Breastfeeding How should I use this medication? Take this medication by mouth with a glass of water. Take it as directed on the prescription label at the same time every day. You can take it with or without food. If it upsets your stomach, take it with food. Do not take your medication more often than directed. Do not stop taking this medication suddenly. This may cause serious, heart-related side effects. If your care team wants you to stop the medication, the dose may be slowly lowered over time to avoid any side effects. Talk to your care team about the use of this medication in children. While it may be prescribed for children as young as 1 year for selected conditions, precautions do apply. Overdosage: If you think you have taken too much of this medicine contact a poison control center or emergency room at once. NOTE: This medicine is only for you. Do not share this medicine with others. What if I miss a dose? If you miss a dose, take it as soon as you can. If it is almost time for your next dose, take only that dose. Do not take double or extra doses. What may interact with this medication? Do not take this medication with any of the following: Amoxapine Arsenic trioxide  Certain antibiotics, such as clarithromycin, erythromycin, gatifloxacin, gemifloxacin, levofloxacin, moxifloxacin, sparfloxacin, or troleandomycin Certain antidepressants, called tricyclic antidepressants such as amitriptyline, imipramine, or nortriptyline Certain medications for irregular heartbeat, such as disopyramide, encainide, moricizine, procainamide, propafenone, and  quinidine Cisapride Delavirdine Droperidol Haloperidol [place] Imatinib Levomethadyl Maprotiline Medications for malaria, such as chloroquine and halofantrine Pentamidine Phenothiazines, such as chlorpromazine, mesoridazine, prochlorperazine, thioridazine Pimozide Quinine Ranolazine Ritonavir Sertindole This medication may also interact with the following: Cimetidine Dofetilide Medications for angina or blood pressure Medications for irregular heartbeat, such as amiodarone and digoxin Ziprasidone This list may not describe all possible interactions. Give your health care provider a list of all the medicines, herbs, non-prescription drugs, or dietary supplements you use. Also tell them if you smoke, drink alcohol, or use illegal drugs. Some items may interact with your medicine. What should I watch for while using this medication? Visit your care team for regular checks on your progress. Because your condition and the use of this medication carries some risk, it is a good idea to carry an identification card, necklace, or bracelet with details of your condition, medications, and care team. Check your blood pressure and pulse rate as directed. Know what your blood pressure and pulse rate should be and when tod contact your care team. Your care team may schedule regular blood tests and electrocardiograms to check your progress. This medication may affect your coordination, reaction time, or judgment. Do not drive or operate machinery until you know how this medication affects you. Sit up or stand slowly to reduce the risk of dizzy or fainting spells. Drinking alcohol with this medication can increase the risk of these side effects. What side effects may I notice from receiving this medication? Side effects that you should report to your care team as soon as possible: Allergic reactions--skin rash, itching, hives, swelling of the face, lips, tongue, or throat Heart failure--shortness of  breath, swelling of the ankles, feet, or hands, sudden weight gain, unusual weakness or fatigue Heart rhythm changes--fast or irregular heartbeat, dizziness, feeling faint or lightheaded, chest pain, trouble breathing Liver injury--right upper belly pain, loss of appetite, nausea, light-colored stool, dark yellow or brown urine, yellowing skin or eyes, unusual weakness or fatigue Side effects that usually do not require medical attention (report to your care team if they continue or are bothersome): Blurry vision Constipation Dizziness Fatigue Headache Nausea Tremors or shaking This list may not describe all possible side effects. Call your doctor for medical advice about side effects. You may report side effects to FDA at 1-800-FDA-1088. Where should I keep my medication? Keep out of the reach of children and pets. Store at room temperature between 15 and 30 degrees C (59 and 86 degrees F). Protect from light. Keep container tightly closed. Throw away any unused medication after the expiration date. NOTE: This sheet is a summary. It may not cover all possible information. If you have questions about this medicine, talk to your doctor, pharmacist, or health care provider.  2023 Elsevier/Gold Standard (2004-12-13 00:00:00)    Cardiac Ablation Cardiac ablation is a procedure to destroy (ablate) heart tissue that is sending bad signals. These bad signals cause the heart to beat very fast or in a way that is not normal. Destroying some tissues can help make the heart rhythm normal. Tell your doctor about: Any allergies you have. All medicines you are taking. These include vitamins, herbs, eye drops, creams, and over-the-counter medicines. Any problems you or family members have had with anesthesia. Any bleeding  problems you have. Any surgeries you have had. Any medical conditions you have. Whether you are pregnant or may be pregnant. What are the risks? Your doctor will talk with you  about risks. These may include: Infection. Bruising and bleeding. Stroke or blood clots. Damage to nearby areas of your body. Allergies to medicines or dyes. Needing a pacemaker if the heart gets damaged. A pacemaker helps the heart beat normally. The procedure not working. What happens before the procedure? Medicines Ask your doctor about changing or stopping: Your normal medicines. Vitamins, herbs, and supplements. Over-the-counter medicines. Do not take aspirin or ibuprofen unless you are told to. General instructions Follow instructions from your doctor about what you may eat and drink. If you will be going home right after the procedure, plan to have a responsible adult: Take you home from the hospital or clinic. You will not be allowed to drive. Care for you for the time you are told. Ask your doctor what steps will be taken to prevent the spread of germs. What happens during the procedure?  An IV tube will be put into one of your veins. You may be given: A sedative. This helps you relax. Anesthesia. This will: Numb certain areas of your body. The skin on your neck or groin will be numbed. A cut (incision) will be made in your neck or groin. A needle will be put through the cut and into a large vein. The small, thin tube (catheter) will be put into the needle. The tube will be moved to your heart. A type of X-ray (fluoroscopy) will be used to help guide the tube. It will also show constant images of the heart on a screen. Dye may be put through the tube. This helps your doctor see your heart. An electric current will be sent from the tube to destroy heart tissue in certain areas. The tube will be taken out. Pressure will be held on your cut. This helps stop bleeding. A bandage (dressing) will be put over your cut. The procedure may vary among doctors and hospitals. What happens after the procedure? You will be monitored until you leave the hospital or clinic. This  includes checking your blood pressure, heart rate and rhythm, breathing rate, and blood oxygen level. Your cut will be checked for bleeding. You will need to lie still for a few hours. If your groin was used, you will need to keep your leg straight for a few hours after the small, thin tube is removed. This information is not intended to replace advice given to you by your health care provider. Make sure you discuss any questions you have with your health care provider. Document Revised: 03/18/2022 Document Reviewed: 03/18/2022 Elsevier Patient Education  [place].

## 2022-11-03 NOTE — Progress Notes (Signed)
Electrophysiology Office Note   Date:  11/03/2022   ID:  Kindle, Strohmeier 1946-05-18, MRN [id number]  PCP:  Mendel Ryder, PA-C  Cardiologist:  Revankar Primary Electrophysiologist:  Regan Lemming, MD    No chief complaint on file.    History of Present Illness: Brooke Pitts is a 77 y.o. female who is being seen today for the evaluation of atrial fibrillation at the request of Mendel Ryder, [place]. Presenting today for electrophysiology evaluation.    She has a history significant for hypertension, hyperlipidemia.  She had a pacemaker inserted at [facility].  She has atrial fibrillation and is on Coumadin for a history of prior stroke.  Today, denies symptoms of palpitations, chest pain, shortness of breath, orthopnea, PND, lower extremity edema, claudication, dizziness, presyncope, syncope, bleeding, or neurologic sequela. The patient is tolerating medications without difficulties.  She is feeling well today.  She did present to [facility] hospital 08/20/2022.  She was in atrial fibrillation at the time.  She felt quite poor with palpitations she converted to sinus rhythm after getting labetalol.  Since then, she has done well.  She has had no further episodes of atrial fibrillation.   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 Overview:  Overview:  May 2014 or 2015 Formatting of this note might be different from the original. May 2014 or 2015   History of osteopenia 11/17/2015   Hyperlipidemia LDL goal <100 10/03/2015   Overview:  Multi statin intolerant  Formatting of this note might be different from the original. Overview:  Overview:  Multi statin intolerant Formatting of this note might be different from the original. Multi statin intolerant. Statin intolerance Oddie Kuhlmann observe. No need to draw lipid panel in future.   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 Unsteadiness. Acute exacerbation of chronic problem. See history of present illness.  Last 2 weeks has been an issue.  But she has had this on and off for years.  Dramamine helps some.  Primarily unsteady which is worse with  change in head position.  Has lasted for days at a time. EXAM shows normal external cana   Vitamin D deficiency 12/03/2015   Past Surgical History:  Procedure Laterality Date   ABDOMINAL HYSTERECTOMY     BILATERAL OOPHORECTOMY     CHOLECYSTECTOMY     PACEMAKER  INSERTION     TONSILLECTOMY       Current Outpatient Medications  Medication Sig Dispense Refill   apixaban (ELIQUIS) 5 MG TABS tablet Take 1 tablet by mouth twice daily 180 tablet 1   atenolol (TENORMIN) 50 MG tablet Take 1.5 tablets (75 mg total) by mouth 2 (two) times daily. 270 tablet 3   b complex vitamins tablet Take 1 tablet by mouth daily.     dimenhyDRINATE (DRAMAMINE) 50 MG tablet Take 50 mg by mouth every 8 (eight) hours as needed for dizziness.     estradiol (ESTRACE) 0.1 MG/GM vaginal cream Place 1 Applicatorful vaginally daily.     irbesartan (AVAPRO) 300 MG tablet Take 300 mg by mouth daily.     lidocaine (LIDODERM) 5 % Place 1 patch onto the skin daily. Remove & Discard patch within 12 hours or as directed by MD 30 patch 0   loratadine (CLARITIN) 10 MG tablet Take 10 mg by mouth daily as needed for allergies or rhinitis.     magnesium oxide (MAG-OX) 400 MG tablet Take 400 mg by mouth daily.     metFORMIN (GLUCOPHAGE-XR) 500 MG 24 hr tablet Take 500 mg by mouth in the morning. and take 1000 mg in the evening     methocarbamol (ROBAXIN) 500 MG tablet Take 1 tablet (500 mg total) by mouth 2 (two) times daily. 30 tablet 0   nitroGLYCERIN (NITROSTAT) 0.4 MG SL tablet Place 1 tablet (0.4 mg total) under the tongue every 5 (five) minutes as needed for chest pain. 25 tablet 11   Omega-3 Fatty Acids (FISH OIL) 1200 MG CPDR Take 1 capsule by mouth daily.      spironolactone (ALDACTONE) 25 MG tablet Take 0.5 tablets (12.5 mg total) by mouth daily.     No current facility-administered medications for this visit.    Allergies:   Flecainide, Cymbalta [duloxetine hcl], Hydrocodone, Lisinopril, Amlodipine, Losartan, and Nsaids   Social History:  The patient  reports that she has never smoked. She has never used smokeless tobacco. She reports that she does not drink alcohol and does not use drugs.   Family History:  The patient's family history includes Arrhythmia in her mother; Atrial  fibrillation in her mother; Heart disease in her brother; Heart failure in her brother.   ROS:  Please see the history of present illness.   Otherwise, review of systems is positive for none.   All other systems are reviewed and negative.   PHYSICAL EXAM: VS:  BP 128/84   Pulse 85   Ht 5' 4.5" (1.638 m)   Wt 223 lb (101.2 kg)   SpO2 99%   BMI 37.69 kg/m  , BMI Body mass index is 37.69 kg/m. GEN: Well nourished, well developed, in no acute distress  HEENT: normal  Neck: no JVD, carotid bruits, or masses Cardiac: RRR; no murmurs, rubs, or gallops,no edema  Respiratory:  clear to auscultation bilaterally, normal work of breathing GI: soft, nontender, nondistended, + BS MS: no deformity or atrophy  Skin: warm and dry, device site well healed Neuro:  Strength and sensation are intact Psych: euthymic mood, full affect  EKG:  EKG is not ordered today. Personal  review of the ekg ordered 08/20/22 shows atrial paced  Personal review of the device interrogation today. Results in [place]: 07/15/2022: ALT 18; BUN 15; Creatinine, Ser 0.90; Hemoglobin 12.6; Platelets 238; Potassium 3.7; Sodium 129    Lipid Panel     Component Value Date/Time   CHOL 181 07/05/2019 1011   TRIG 166 (H) 07/05/2019 1011   HDL 55 07/05/2019 1011   CHOLHDL 3.3 07/05/2019 1011   LDLCALC 97 07/05/2019 1011     Wt Readings from Last 3 Encounters:  11/03/22 223 lb (101.2 kg)  09/10/22 225 lb 3.2 oz (102.2 kg)  08/20/22 225 lb 1.9 oz (102.1 kg)      Other studies Reviewed: Additional studies/ records that were reviewed today include: TTE 08/22/2022 Review of the above records today demonstrates:  Mild left ventricular hypertrophy  Left ventricular systolic function is normal.  LV ejection fraction = 60-65%.  There is trace mitral regurgitation.  There is mild tricuspid regurgitation.  There is no pericardial effusion.  There is no comparison study available   Myoview 9/*13/18 Nuclear  stress EF: 65%. This is a low risk study. The left ventricular ejection fraction is normal (55-65%). Normal study. No evidence of ischemia or previous infarction .   ASSESSMENT AND PLAN:  1.  Paroxysmal atrial fibrillation: Currently on Coumadin.  CHA2DS2-VASc of 3.  She went to [facility] with atrial fibrillation feeling quite poorly.  She may prefer a rhythm control strategy.  We discussed ablation versus flecainide.  She Bowden Boody continue to monitor.  Rozelle Caudle give her information on both of these and she Aviendha Azbell call us back with an answer.  2.  Hypertension: Currently well-controlled  3.  Sick sinus syndrome: Status post Medtronic dual-chamber pacemaker.  Device functioning appropriately.  No changes.  4.  Secondary hypercoagulable state: Currently on Coumadin for atrial fibrillation as above  Current medicines are reviewed at length with the patient today.   The patient does not have concerns regarding her medicines.  The following changes were made today: None  Labs/ tests ordered today include:  Orders Placed This Encounter  Procedures   CUP [place]     Disposition:   FU with Ulas Zuercher 6 months  Signed, Oluwatomisin Deman Meredith Leeds, MD  11/03/2022 4:58 PM     [place] [address] [place] [address] [phone] (office) [phone] (fax)

## 2022-11-22 ENCOUNTER — Other Ambulatory Visit: Payer: Self-pay | Admitting: Cardiology

## 2022-11-24 NOTE — Telephone Encounter (Signed)
Pt last saw Dr Curt Bears 11/03/22, last labs 10/09/22 Creat 0.74, age 77, weight 101.2kg, based on specified criteria pt is on appropriate dosage of Eliquis 49m BID for afib.  Will refill rx.

## 2022-11-25 NOTE — Progress Notes (Signed)
Remote pacemaker transmission.   

## 2022-12-24 ENCOUNTER — Other Ambulatory Visit: Payer: Self-pay | Admitting: Cardiology

## 2023-01-19 ENCOUNTER — Ambulatory Visit (INDEPENDENT_AMBULATORY_CARE_PROVIDER_SITE_OTHER): Payer: Medicare Other

## 2023-01-19 DIAGNOSIS — I495 Sick sinus syndrome: Secondary | ICD-10-CM | POA: Diagnosis not present

## 2023-01-20 LAB — CUP PACEART REMOTE DEVICE CHECK
Battery Remaining Longevity: 17 mo
Battery Voltage: 2.9 V
Brady Statistic AP VP Percent: 1.16 %
Brady Statistic AP VS Percent: 68.47 %
Brady Statistic AS VP Percent: 0.02 %
Brady Statistic AS VS Percent: 30.35 %
Brady Statistic RA Percent Paced: 68.06 %
Brady Statistic RV Percent Paced: 1.15 %
Date Time Interrogation Session: 20240408105631
Implantable Lead Connection Status: 753985
Implantable Lead Connection Status: 753985
Implantable Lead Implant Date: 20150114
Implantable Lead Implant Date: 20150114
Implantable Lead Location: 753859
Implantable Lead Location: 753860
Implantable Lead Model: 5076
Implantable Lead Model: 5076
Implantable Pulse Generator Implant Date: 20150114
Lead Channel Impedance Value: 304 Ohm
Lead Channel Impedance Value: 380 Ohm
Lead Channel Impedance Value: 418 Ohm
Lead Channel Impedance Value: 494 Ohm
Lead Channel Pacing Threshold Amplitude: 0.75 V
Lead Channel Pacing Threshold Amplitude: 0.875 V
Lead Channel Pacing Threshold Pulse Width: 0.4 ms
Lead Channel Pacing Threshold Pulse Width: 0.4 ms
Lead Channel Sensing Intrinsic Amplitude: 1.75 mV
Lead Channel Sensing Intrinsic Amplitude: 1.75 mV
Lead Channel Sensing Intrinsic Amplitude: 15.875 mV
Lead Channel Sensing Intrinsic Amplitude: 15.875 mV
Lead Channel Setting Pacing Amplitude: 1.75 V
Lead Channel Setting Pacing Amplitude: 2 V
Lead Channel Setting Pacing Pulse Width: 0.4 ms
Lead Channel Setting Sensing Sensitivity: 0.9 mV
Zone Setting Status: 755011
Zone Setting Status: 755011

## 2023-02-25 NOTE — Progress Notes (Signed)
Remote pacemaker transmission.   

## 2023-03-19 ENCOUNTER — Other Ambulatory Visit: Payer: Self-pay | Admitting: Cardiology

## 2023-04-20 ENCOUNTER — Ambulatory Visit (INDEPENDENT_AMBULATORY_CARE_PROVIDER_SITE_OTHER): Payer: Medicare Other

## 2023-04-20 DIAGNOSIS — I495 Sick sinus syndrome: Secondary | ICD-10-CM

## 2023-04-21 LAB — CUP PACEART REMOTE DEVICE CHECK
Battery Remaining Longevity: 13 mo
Battery Voltage: 2.89 V
Brady Statistic AP VP Percent: 3.2 %
Brady Statistic AP VS Percent: 72.3 %
Brady Statistic AS VP Percent: 0.02 %
Brady Statistic AS VS Percent: 24.48 %
Brady Statistic RA Percent Paced: 73.4 %
Brady Statistic RV Percent Paced: 3.07 %
Date Time Interrogation Session: 20240708145657
Implantable Lead Connection Status: 753985
Implantable Lead Connection Status: 753985
Implantable Lead Implant Date: 20150114
Implantable Lead Implant Date: 20150114
Implantable Lead Location: 753859
Implantable Lead Location: 753860
Implantable Lead Model: 5076
Implantable Lead Model: 5076
Implantable Pulse Generator Implant Date: 20150114
Lead Channel Impedance Value: 304 Ohm
Lead Channel Impedance Value: 380 Ohm
Lead Channel Impedance Value: 418 Ohm
Lead Channel Impedance Value: 513 Ohm
Lead Channel Pacing Threshold Amplitude: 0.75 V
Lead Channel Pacing Threshold Amplitude: 1 V
Lead Channel Pacing Threshold Pulse Width: 0.4 ms
Lead Channel Pacing Threshold Pulse Width: 0.4 ms
Lead Channel Sensing Intrinsic Amplitude: 1.75 mV
Lead Channel Sensing Intrinsic Amplitude: 1.75 mV
Lead Channel Sensing Intrinsic Amplitude: 17.625 mV
Lead Channel Sensing Intrinsic Amplitude: 17.625 mV
Lead Channel Setting Pacing Amplitude: 2 V
Lead Channel Setting Pacing Amplitude: 2 V
Lead Channel Setting Pacing Pulse Width: 0.4 ms
Lead Channel Setting Sensing Sensitivity: 0.9 mV
Zone Setting Status: 755011
Zone Setting Status: 755011

## 2023-05-07 NOTE — Progress Notes (Signed)
Remote pacemaker transmission.   

## 2023-05-16 ENCOUNTER — Other Ambulatory Visit: Payer: Self-pay | Admitting: Cardiology

## 2023-05-18 ENCOUNTER — Encounter: Payer: Self-pay | Admitting: Cardiology

## 2023-05-18 ENCOUNTER — Ambulatory Visit: Payer: Medicare Other | Attending: Cardiology | Admitting: Cardiology

## 2023-05-18 VITALS — BP 132/88 | HR 78 | Ht 64.0 in | Wt 224.4 lb

## 2023-05-18 DIAGNOSIS — D6869 Other thrombophilia: Secondary | ICD-10-CM

## 2023-05-18 DIAGNOSIS — I495 Sick sinus syndrome: Secondary | ICD-10-CM

## 2023-05-18 DIAGNOSIS — I480 Paroxysmal atrial fibrillation: Secondary | ICD-10-CM

## 2023-05-18 LAB — CUP PACEART INCLINIC DEVICE CHECK
Battery Remaining Longevity: 13 mo
Battery Voltage: 2.88 V
Brady Statistic AP VP Percent: 2.24 %
Brady Statistic AP VS Percent: 72.73 %
Brady Statistic AS VP Percent: 0.04 %
Brady Statistic AS VS Percent: 24.99 %
Brady Statistic RA Percent Paced: 73 %
Brady Statistic RV Percent Paced: 2.19 %
Date Time Interrogation Session: 20240805154056
Implantable Lead Connection Status: 753985
Implantable Lead Connection Status: 753985
Implantable Lead Implant Date: 20150114
Implantable Lead Implant Date: 20150114
Implantable Lead Location: 753859
Implantable Lead Location: 753860
Implantable Lead Model: 5076
Implantable Lead Model: 5076
Implantable Pulse Generator Implant Date: 20150114
Lead Channel Impedance Value: 323 Ohm
Lead Channel Impedance Value: 399 Ohm
Lead Channel Impedance Value: 456 Ohm
Lead Channel Impedance Value: 513 Ohm
Lead Channel Pacing Threshold Amplitude: 0.75 V
Lead Channel Pacing Threshold Amplitude: 0.875 V
Lead Channel Pacing Threshold Pulse Width: 0.4 ms
Lead Channel Pacing Threshold Pulse Width: 0.4 ms
Lead Channel Sensing Intrinsic Amplitude: 1.75 mV
Lead Channel Sensing Intrinsic Amplitude: 16.875 mV
Lead Channel Sensing Intrinsic Amplitude: 19.625 mV
Lead Channel Sensing Intrinsic Amplitude: 2 mV
Lead Channel Setting Pacing Amplitude: 1.75 V
Lead Channel Setting Pacing Amplitude: 2 V
Lead Channel Setting Pacing Pulse Width: 0.4 ms
Lead Channel Setting Sensing Sensitivity: 0.9 mV
Zone Setting Status: 755011
Zone Setting Status: 755011

## 2023-05-18 NOTE — Progress Notes (Signed)
  Electrophysiology Office Note:   Date:  05/18/2023  ID:  Brooke Pitts, Brooke Pitts Feb 28, 1946, MRN [id number]  Primary Cardiologist: None Electrophysiologist: Draven Laine Jorja Loa, MD      History of Present Illness:   Brooke Pitts is a 77 y.o. female with h/o atrial fibrillation, pretension, sick sinus syndrome post pacemaker seen today for routine electrophysiology followup.  Since last being seen in our clinic the patient reports doing well.  She notes minimal episodes of atrial fibrillation over the last few months.  She has had 2 hours twice since March.  Aside from that, she is able to do her daily activities and is without restriction..  she denies chest pain, palpitations, dyspnea, PND, orthopnea, nausea, vomiting, dizziness, syncope, edema, weight gain, or early satiety.   Review of systems complete and found to be negative unless listed in HPI.      EP Information / Studies Reviewed:    EKG is ordered today. Personal review as below.  EKG Interpretation Date/Time:  Monday May 18 2023 14:23:08 EDT Ventricular Rate:  78 PR Interval:  216 QRS Duration:  70 QT Interval:  350 QTC Calculation: 399 R Axis:   -18  Text Interpretation: Atrial-paced rhythm with prolonged AV conduction Minimal voltage criteria for LVH, may be normal variant ( R in aVL ) Possible Anterior infarct (cited on or before 18-Apr-2020) When compared with ECG of 18-Apr-2020 19:17, No significant change since last tracing Confirmed by Kinleigh Nault ([id number]) on 05/18/2023 2:32:52 PM   PPM Interrogation-  reviewed in detail today,  See PACEART report.  Device History: Medtronic Dual Chamber PPM  Sinus Node Dysfunction  Risk Assessment/Calculations:    CHA2DS2-VASc Score = 7   This indicates a 11.2% annual risk of stroke. The patient's score is based upon: CHF History: 0 HTN History: 1 Diabetes History: 1 Stroke History: 2 Vascular Disease History: 0 Age Score: 2 Gender Score: 1              Physical Exam:   VS:  BP 132/88   Pulse 78   Ht 5\' 4"  (1.626 m)   Wt 224 lb 6.4 oz (101.8 kg)   SpO2 99%   BMI 38.52 kg/m    Wt Readings from Last 3 Encounters:  05/18/23 224 lb 6.4 oz (101.8 kg)  11/03/22 223 lb (101.2 kg)  09/10/22 225 lb 3.2 oz (102.2 kg)     GEN: Well nourished, well developed in no acute distress NECK: No JVD; No carotid bruits CARDIAC: Regular rate and rhythm, no murmurs, rubs, gallops RESPIRATORY:  Clear to auscultation without rales, wheezing or rhonchi  ABDOMEN: Soft, non-tender, non-distended EXTREMITIES:  No edema; No deformity   ASSESSMENT AND PLAN:    1.  SND s/p Medtronic PPM  Normal PPM function See Pace Art report No changes today  2.  Paroxysmal atrial fibrillation: Currently on Coumadin.  Remains in sinus rhythm with minimal episodes of atrial fibrillation.  Tomisha Reppucci continue with current management.  3.  Hypertension: Currently well-controlled  4.  Secondary hypercoagulable state: Currently on Coumadin for atrial fibrillation  Disposition:   Follow up with Dr. Elberta Fortis in 12 months  Signed, Josep Luviano Jorja Loa, MD

## 2023-05-18 NOTE — Patient Instructions (Signed)
Medication Instructions:  Your physician recommends that you continue on your current medications as directed. Please refer to the Current Medication list given to you today.  *If you need a refill on your cardiac medications before your next appointment, please call your pharmacy*   Lab Work: None ordered  Testing/Procedures: None ordered   Follow-Up: At [facility], you and your health needs are our priority.  As part of our continuing mission to provide you with exceptional heart care, we have created designated Provider Care Teams.  These Care Teams include your primary Cardiologist (physician) and Advanced Practice Providers (APPs -  Physician Assistants and Nurse Practitioners) who all work together to provide you with the care you need, when you need it.  Remote monitoring is used to monitor your Pacemaker or ICD from home. This monitoring reduces the number of office visits required to check your device to one time per year. It allows [place] to keep an eye on the functioning of your device to ensure it is working properly. You are scheduled for a device check from home on 07/20/2023. You may send your transmission at any time that day. If you have a wireless device, the transmission will be sent automatically. After your physician reviews your transmission, you will receive a postcard with your next transmission date.  Your next appointment:   1 year(s)  The format for your next appointment:   In Person  Provider:   Loman Brooklyn, MD    Thank you for choosing [facility]!!   Dory Horn, RN [phone]

## 2023-05-18 NOTE — Telephone Encounter (Signed)
Prescription refill request for Eliquis received. Indication:afib Last office visit:1/24 Scr:0.81  7/24 Age: 77 Weight:101.2  kg  Prescription refilled

## 2023-06-25 ENCOUNTER — Ambulatory Visit: Payer: Medicare Other | Attending: Cardiology | Admitting: Cardiology

## 2023-06-25 ENCOUNTER — Encounter: Payer: Self-pay | Admitting: Cardiology

## 2023-06-25 VITALS — BP 134/84 | HR 84 | Ht 64.0 in | Wt 223.0 lb

## 2023-06-25 DIAGNOSIS — Z950 Presence of cardiac pacemaker: Secondary | ICD-10-CM

## 2023-06-25 DIAGNOSIS — I10 Essential (primary) hypertension: Secondary | ICD-10-CM

## 2023-06-25 DIAGNOSIS — E088 Diabetes mellitus due to underlying condition with unspecified complications: Secondary | ICD-10-CM

## 2023-06-25 DIAGNOSIS — I480 Paroxysmal atrial fibrillation: Secondary | ICD-10-CM

## 2023-06-25 DIAGNOSIS — I4729 Other ventricular tachycardia: Secondary | ICD-10-CM

## 2023-06-25 DIAGNOSIS — R0602 Shortness of breath: Secondary | ICD-10-CM

## 2023-06-25 DIAGNOSIS — Z8673 Personal history of transient ischemic attack (TIA), and cerebral infarction without residual deficits: Secondary | ICD-10-CM

## 2023-06-25 DIAGNOSIS — E785 Hyperlipidemia, unspecified: Secondary | ICD-10-CM

## 2023-06-25 DIAGNOSIS — Z7984 Long term (current) use of oral hypoglycemic drugs: Secondary | ICD-10-CM

## 2023-06-25 MED ORDER — SPIRONOLACTONE 25 MG PO TABS: 25.0000 mg | ORAL_TABLET | Freq: Every day | ORAL | 3 refills | Status: AC

## 2023-06-25 NOTE — Progress Notes (Signed)
Cardiology Office Note:    Date:  06/25/2023   ID:  Brooke Pitts, DOB 1946-01-19, MRN [id number]  PCP:  Mendel Ryder, PA-C  Cardiologist:  Garwin Brothers, MD   Referring MD: Mendel Ryder, PA-C    ASSESSMENT:    1. Essential hypertension   2. NSVT (nonsustained ventricular tachycardia) (HCC)   3. PAF (paroxysmal atrial fibrillation) (HCC)   4. Diabetes mellitus due to underlying condition with unspecified complications (HCC)   5. Hyperlipidemia LDL goal <100   6. History of CVA (cerebrovascular accident)   7. S/P placement of cardiac pacemaker    PLAN:    In order of problems listed above:  Primary prevention stressed with the patient.  Importance of compliance with diet medication stressed and patient verbalized standing. Dyspnea on exertion: Unlikely to be coronary etiology because of the fact that patient has preserved EF and coronaries were fine in 2021.  I will do D-dimer to assess for any history of pulmonary thromboembolism and she is agreeable.  Will do a Chem-7 also today Elevated blood pressure: She has not taken her medications this morning but her blood pressures at home are on the higher side.  She mentioned numbers.  I increased spironolactone to 25 mg 1 tablet daily she will be back in 2 weeks for pulse blood pressure check and Chem-7.  Salt intake issues and lifestyle modification urged she vocalized understanding. Obesity: Weight reduction stressed risks of obesity explained and she promises to do better. Permanent pacemaker: It is nearing end-of-life.  She is monitored closely by our electrophysiology colleagues and is aware of this. Patient will be seen in follow-up appointment in 9 months or earlier if the patient has any concerns.    Medication Adjustments/Labs and Tests Ordered: Current medicines are reviewed at length with the patient today.  Concerns regarding medicines are outlined above.  No orders of the defined types were placed in this  encounter.  No orders of the defined types were placed in this encounter.    No chief complaint on file.    History of Present Illness:    Brooke Pitts is a 77 y.o. female.  Patient has past medical history of paroxysmal atrial fibrillation, nonsustained ventricular tachycardia, essential hypertension, mixed dyslipidemia and obesity.  She denies any problems at this time and takes care of activities of daily living.  No chest pain orthopnea or PND.  She gives history of shortness of breath on exertion.  She leads a sedentary lifestyle.  At the time of my evaluation, the patient is alert awake oriented and in no distress.  Past Medical History:  Diagnosis Date   A-fib [facility])    Atrial fibrillation with rapid ventricular response (HCC) 08/21/2022   BPPV (benign paroxysmal positional vertigo) 11/17/2015   Chest wall pain 11/03/2015   Chronic laryngitis 10/09/2022   Last Assessment & Plan:    Wheezing in the throat.   Chronic history of intermittent wheezing sensation in the throat.  Does not prevent her from doing anything that she enjoys.  Pulmonology evaluation was negative.  Denies any difficulty swallowing or sore throat.  Denies any change in her voice.   EXAM shows normal sounding voice.  Oral cavity and oropharynx are unremarkable.  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 Overview:  Overview:  May 2014 or 2015 Formatting of this note might be different from the original. May 2014 or 2015   History of osteopenia 11/17/2015   Hyperlipidemia LDL goal <100 10/03/2015   Overview:  Multi statin intolerant  Formatting of this note might be different from the original. Overview:  Overview:  Multi statin intolerant Formatting of this note might be different from the original. Multi statin intolerant. Statin intolerance will observe. No need to draw lipid panel in future.   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 Unsteadiness. Acute exacerbation of chronic problem. See history of present illness.  Last 2 weeks has been an issue.  But she has had this on and off for years.  Dramamine helps some.  Primarily unsteady which is worse with change in head position.  Has lasted for days at a time. EXAM shows normal external cana   Vitamin D deficiency 12/03/2015    Past Surgical History:  Procedure Laterality Date   ABDOMINAL HYSTERECTOMY     BILATERAL OOPHORECTOMY     CHOLECYSTECTOMY     PACEMAKER INSERTION     TONSILLECTOMY       Current Medications: Current Meds  Medication Sig   apixaban (ELIQUIS) 5 MG TABS tablet Take 1 tablet by mouth twice daily   atenolol (TENORMIN) 50 MG tablet Take 1.5 tablets (75 mg total) by mouth 2 (two) times daily.   b complex vitamins tablet Take 1 tablet by mouth daily.   dimenhyDRINATE (DRAMAMINE) 50 MG tablet Take 50 mg by mouth every 8 (eight) hours as needed for dizziness.   estradiol (ESTRACE) 0.1 MG/GM vaginal cream Place 1 Applicatorful vaginally as needed (dryness).   irbesartan (AVAPRO) 300 MG tablet Take 300 mg by mouth daily.   lidocaine (LIDODERM) 5 % Place 1 patch onto the skin daily. Remove & Discard patch within 12 hours or as directed by MD (Patient taking differently: Place 1 patch onto the skin as needed (pain). Remove & Discard patch within 12 hours or as directed by MD)   loratadine (CLARITIN) 10 MG tablet Take 10 mg by mouth daily as needed for allergies or rhinitis.   magnesium oxide (MAG-OX) 400 MG tablet Take 400 mg by mouth daily.   metFORMIN (GLUCOPHAGE-XR) 500 MG 24 hr tablet Take 500 mg by mouth  in the morning. and take 1000 mg in the evening   nitroGLYCERIN (NITROSTAT) 0.4 MG SL tablet Place 1 tablet (0.4 mg total) under the tongue every 5 (five) minutes as needed for chest pain.   Omega-3 Fatty Acids (FISH OIL) 1200 MG CPDR Take 1 capsule by mouth daily.    spironolactone (ALDACTONE) 25 MG tablet Take 0.5 tablets (12.5 mg total) by mouth daily.     Allergies:   Flecainide, Cymbalta [duloxetine hcl], Hydrocodone, Lisinopril, Amlodipine, Losartan, and Nsaids   Social History   Socioeconomic History   Marital status: Married    Spouse name: Not on file   Number of children: Not on file   Years of education: Not on file   Highest education level: Not on file  Occupational History   Not on file  Tobacco Use   Smoking status: Never   Smokeless tobacco: Never  Vaping Use   Vaping status: Never Used  Substance and Sexual Activity   Alcohol use: No    Drug use: No   Sexual activity: Not on file  Other Topics Concern   Not on file  Social History Narrative   Not on file   Social Determinants of Health   Financial Resource Strain: Not on file  Food Insecurity: Not on file  Transportation Needs: Not on file  Physical Activity: Not on file  Stress: Not on file  Social Connections: Not on file     Family History: The patient's family history includes Arrhythmia in her mother; Atrial fibrillation in her mother; Heart disease in her brother; Heart failure in her brother.  ROS:   Please see the history of present illness.    All other systems reviewed and are negative.  EKGs/Labs/Other Studies Reviewed:    The following studies were reviewed today: I discussed my findings with the patient at length.   Recent Labs: 07/15/2022: ALT 18; BUN 15; Creatinine, Ser 0.90; Hemoglobin 12.6; Platelets 238; Potassium 3.7; Sodium 129  Recent Lipid Panel    Component Value Date/Time   CHOL 181 07/05/2019 1011   TRIG 166 (H) 07/05/2019 1011   HDL 55 07/05/2019 1011   CHOLHDL 3.3 07/05/2019 1011   LDLCALC 97 07/05/2019 1011    Physical Exam:    VS:  BP (!) 162/84   Pulse 84   Ht 5\' 4"  (1.626 m)   Wt 223 lb (101.2 kg)   SpO2 96%   BMI 38.28 kg/m     Wt Readings from Last 3 Encounters:  06/25/23 223 lb (101.2 kg)  05/18/23 224 lb 6.4 oz (101.8 kg)  11/03/22 223 lb (101.2 kg)     GEN: Patient is in no acute distress HEENT: Normal NECK: No JVD; No carotid bruits LYMPHATICS: No lymphadenopathy CARDIAC: Hear sounds regular, 2/6 systolic murmur at the apex. RESPIRATORY:  Clear to auscultation without rales, wheezing or rhonchi  ABDOMEN: Soft, non-tender, non-distended MUSCULOSKELETAL:  No edema; No deformity  SKIN: Warm and dry NEUROLOGIC:  Alert and oriented x 3 PSYCHIATRIC:  Normal affect   Signed, Garwin Brothers, MD  06/25/2023 8:15 AM    [facility]

## 2023-06-25 NOTE — Patient Instructions (Signed)
Please keep a BP log for 2 weeks and send by MyChart or mail.  Blood Pressure Record Sheet To take your blood pressure, you will need a blood pressure machine. You can buy a blood pressure machine (blood pressure monitor) at your clinic, drug store, or online. When choosing one, consider: An automatic monitor that has an arm cuff. A cuff that wraps snugly around your upper arm. You should be able to fit only one finger between your arm and the cuff. A device that stores blood pressure reading results. Do not choose a monitor that measures your blood pressure from your wrist or finger. Follow your health care provider's instructions for how to take your blood pressure. To use this form: Get one reading in the morning (a.m.) 1-2 hours after you take any medicines. Get one reading in the evening (p.m.) before supper.   Blood pressure log Date: _______________________  a.m. _____________________(1st reading) HR___________            p.m. _____________________(2nd reading) HR__________  Date: _______________________  a.m. _____________________(1st reading) HR___________            p.m. _____________________(2nd reading) HR__________  Date: _______________________  a.m. _____________________(1st reading) HR___________            p.m. _____________________(2nd reading) HR__________  Date: _______________________  a.m. _____________________(1st reading) HR___________            p.m. _____________________(2nd reading) HR__________  Date: _______________________  a.m. _____________________(1st reading) HR___________            p.m. _____________________(2nd reading) HR__________  Date: _______________________  a.m. _____________________(1st reading) HR___________            p.m. _____________________(2nd reading) HR__________  Date: _______________________  a.m. _____________________(1st reading) HR___________            p.m. _____________________(2nd reading)  HR__________   This information is not intended to replace advice given to you by your health care provider. Make sure you discuss any questions you have with your health care provider. Document Revised: 01/18/2020 Document Reviewed: 01/18/2020 Elsevier Patient Education  2021 [employer]   Medication Instructions:  Your physician has recommended you make the following change in your medication:   Increase your Spironolactone to 25 mg daily. Keep a BP log taking your BP 2 hours after this medication. Return in 2 week for a nurse visit to review your log and labs.  *If you need a refill on your cardiac medications before your next appointment, please call your pharmacy*   Lab Work: Your physician recommends that you have a BMP and D-Dimer today in the office.  Your physician recommends that you return for lab work in: 2 weeks at your nurse visit for BMP. [facility] lab is located on the 3rd floor, [address]. Hours are Monday - Friday 8 am to 4 pm, closed 11:30 am to 1:00 pm. You do NOT need an appointment.   If you have labs (blood work) drawn today and your tests are completely normal, you will receive your results only by: MyChart Message (if you have MyChart) OR A paper copy in the mail If you have any lab test that is abnormal or we need to change your treatment, we will call you to review the results.   Testing/Procedures: None ordered   Follow-Up: At [facility], you and your health needs are our priority.  As part of our continuing mission to provide you with exceptional heart care, we have created designated Provider Care Teams.  These Care  Teams include your primary Cardiologist (physician) and Advanced Practice Providers (APPs -  Physician Assistants and Nurse Practitioners) who all work together to provide you with the care you need, when you need it.  We recommend signing up for the patient portal called "MyChart".  Sign up information is provided on this  After Visit Summary.  MyChart is used to connect with patients for Virtual Visits (Telemedicine).  Patients are able to view lab/test results, encounter notes, upcoming appointments, etc.  Non-urgent messages can be sent to your provider as well.   To learn more about what you can do with MyChart, go to [email or web address].    Your next appointment:   9 month(s)  The format for your next appointment:   In Person  Provider:   Belva Crome, MD    Other Instructions none  Important Information About Sugar

## 2023-06-26 LAB — BASIC METABOLIC PANEL
BUN/Creatinine Ratio: 12 (ref 12–28)
BUN: 11 mg/dL (ref 8–27)
CO2: 23 mmol/L (ref 20–29)
Calcium: 9.5 mg/dL (ref 8.7–10.3)
Chloride: 101 mmol/L (ref 96–106)
Creatinine, Ser: 0.93 mg/dL (ref 0.57–1.00)
Glucose: 174 mg/dL — ABNORMAL HIGH (ref 70–99)
Potassium: 4.8 mmol/L (ref 3.5–5.2)
Sodium: 139 mmol/L (ref 134–144)
eGFR: 64 mL/min/{1.73_m2} (ref >59)

## 2023-06-26 LAB — D-DIMER, QUANTITATIVE: D-DIMER: 0.31 mg{FEU}/L (ref 0.00–0.49)

## 2023-07-08 ENCOUNTER — Encounter: Payer: Self-pay | Admitting: Cardiology

## 2023-07-09 ENCOUNTER — Ambulatory Visit: Payer: Medicare Other | Attending: Cardiology

## 2023-07-09 VITALS — BP 148/74 | HR 78 | Resp 18 | Ht 64.0 in | Wt 222.1 lb

## 2023-07-09 DIAGNOSIS — I10 Essential (primary) hypertension: Secondary | ICD-10-CM

## 2023-07-09 MED ORDER — ATENOLOL 100 MG PO TABS: 100.0000 mg | ORAL_TABLET | Freq: Two times a day (BID) | ORAL | 3 refills | Status: DC

## 2023-07-09 NOTE — Progress Notes (Signed)
Nurse Visit   Date of Encounter: 07/09/2023 ID: DESHIRA VOGELMAN, DOB September 14, 1946, MRN [id number]  PCP:  Georgana Curio   [facility] Providers Cardiologist: Revankar Electrophysiologist:  Will Jorja Loa, MD      Visit Details   VS:  BP (!) 148/74 (BP Location: Left Arm, Patient Position: Sitting, Cuff Size: Normal)   Pulse 78   Resp 18   Ht 5\' 4"  (1.626 m)   Wt 222 lb 0.8 oz (100.7 kg)   SpO2 98%   BMI 38.11 kg/m  , BMI Body mass index is 38.11 kg/m.  Wt Readings from Last 3 Encounters:  07/09/23 222 lb 0.8 oz (100.7 kg)  06/25/23 223 lb (101.2 kg)  05/18/23 224 lb 6.4 oz (101.8 kg)     Reason for visit: BP Performed today: Vitals, Provider consulted:Revankar, and Education Changes (medications, testing, etc.) : Increased Atenolol from 75 mg twice daily to 100 mg BID Length of Visit: 15 minutes    Medications Adjustments/Labs and Tests Ordered: Orders Placed This Encounter  Procedures   Basic metabolic panel   Meds ordered this encounter  Medications   atenolol (TENORMIN) 100 MG tablet    Sig: Take 1 tablet (100 mg total) by mouth 2 (two) times daily.    Dispense:  180 tablet    Refill:  3    Pt does not need a refill at this time.     Signed, Eleonore Chiquito, RN  07/09/2023 8:42 AM

## 2023-07-09 NOTE — Patient Instructions (Addendum)
Please keep a BP log for 2 weeks and send by MyChart or mail.  Blood Pressure Record Sheet To take your blood pressure, you will need a blood pressure machine. You can buy a blood pressure machine (blood pressure monitor) at your clinic, drug store, or online. When choosing one, consider: An automatic monitor that has an arm cuff. A cuff that wraps snugly around your upper arm. You should be able to fit only one finger between your arm and the cuff. A device that stores blood pressure reading results. Do not choose a monitor that measures your blood pressure from your wrist or finger. Follow your health care provider's instructions for how to take your blood pressure. To use this form: Get one reading in the morning (a.m.) 1-2 hours after you take any medicines. Get one reading in the evening (p.m.) before supper.   Blood pressure log Date: _______________________  a.m. _____________________(1st reading) HR___________            p.m. _____________________(2nd reading) HR__________  Date: _______________________  a.m. _____________________(1st reading) HR___________            p.m. _____________________(2nd reading) HR__________  Date: _______________________  a.m. _____________________(1st reading) HR___________            p.m. _____________________(2nd reading) HR__________  Date: _______________________  a.m. _____________________(1st reading) HR___________            p.m. _____________________(2nd reading) HR__________  Date: _______________________  a.m. _____________________(1st reading) HR___________            p.m. _____________________(2nd reading) HR__________  Date: _______________________  a.m. _____________________(1st reading) HR___________            p.m. _____________________(2nd reading) HR__________  Date: _______________________  a.m. _____________________(1st reading) HR___________            p.m. _____________________(2nd reading)  HR__________   This information is not intended to replace advice given to you by your health care provider. Make sure you discuss any questions you have with your health care provider. Document Revised: 01/18/2020 Document Reviewed: 01/18/2020 Elsevier Patient Education  2021 Elsevier Inc.   Medication Instructions:  Your physician has recommended you make the following change in your medication:   Increase your Atenolol to 100 mg twice daily. Send your BP log in 2 weeks.  *If you need a refill on your cardiac medications before your next appointment, please call your pharmacy*   Lab Work: None ordered If you have labs (blood work) drawn today and your tests are completely normal, you will receive your results only by: MyChart Message (if you have MyChart) OR A paper copy in the mail If you have any lab test that is abnormal or we need to change your treatment, we will call you to review the results.   Testing/Procedures: None ordered   Follow-Up: At [facility], you and your health needs are our priority.  As part of our continuing mission to provide you with exceptional heart care, we have created designated Provider Care Teams.  These Care Teams include your primary Cardiologist (physician) and Advanced Practice Providers (APPs -  Physician Assistants and Nurse Practitioners) who all work together to provide you with the care you need, when you need it.  We recommend signing up for the patient portal called "MyChart".  Sign up information is provided on this After Visit Summary.  MyChart is used to connect with patients for Virtual Visits (Telemedicine).  Patients are able to view lab/test results, encounter notes, upcoming appointments, etc.  Non-urgent  messages can be sent to your provider as well.   To learn more about what you can do with MyChart, go to [email or web address].    Your next appointment:   As directed  The format for your next appointment:   In  Person  Provider:   Belva Crome, MD     Other Instructions none  Important Information About Sugar

## 2023-07-10 LAB — BASIC METABOLIC PANEL
BUN/Creatinine Ratio: 11 — ABNORMAL LOW (ref 12–28)
BUN: 9 mg/dL (ref 8–27)
CO2: 23 mmol/L (ref 20–29)
Calcium: 9.5 mg/dL (ref 8.7–10.3)
Chloride: 100 mmol/L (ref 96–106)
Creatinine, Ser: 0.84 mg/dL (ref 0.57–1.00)
Glucose: 189 mg/dL — ABNORMAL HIGH (ref 70–99)
Potassium: 4.5 mmol/L (ref 3.5–5.2)
Sodium: 138 mmol/L (ref 134–144)
eGFR: 72 mL/min/{1.73_m2} (ref >59)

## 2023-07-20 ENCOUNTER — Ambulatory Visit (INDEPENDENT_AMBULATORY_CARE_PROVIDER_SITE_OTHER): Payer: Medicare Other

## 2023-07-20 DIAGNOSIS — I495 Sick sinus syndrome: Secondary | ICD-10-CM

## 2023-07-21 LAB — CUP PACEART REMOTE DEVICE CHECK
Battery Remaining Longevity: 10 mo
Battery Voltage: 2.88 V
Brady Statistic AP VP Percent: 2.07 %
Brady Statistic AP VS Percent: 89.39 %
Brady Statistic AS VP Percent: 0.04 %
Brady Statistic AS VS Percent: 8.5 %
Brady Statistic RA Percent Paced: 89.3 %
Brady Statistic RV Percent Paced: 1.99 %
Date Time Interrogation Session: 20241007111522
Implantable Lead Connection Status: 753985
Implantable Lead Connection Status: 753985
Implantable Lead Implant Date: 20150114
Implantable Lead Implant Date: 20150114
Implantable Lead Location: 753859
Implantable Lead Location: 753860
Implantable Lead Model: 5076
Implantable Lead Model: 5076
Implantable Pulse Generator Implant Date: 20150114
Lead Channel Impedance Value: 323 Ohm
Lead Channel Impedance Value: 399 Ohm
Lead Channel Impedance Value: 437 Ohm
Lead Channel Impedance Value: 494 Ohm
Lead Channel Pacing Threshold Amplitude: 0.875 V
Lead Channel Pacing Threshold Amplitude: 1 V
Lead Channel Pacing Threshold Pulse Width: 0.4 ms
Lead Channel Pacing Threshold Pulse Width: 0.4 ms
Lead Channel Sensing Intrinsic Amplitude: 1.625 mV
Lead Channel Sensing Intrinsic Amplitude: 1.625 mV
Lead Channel Sensing Intrinsic Amplitude: 16.625 mV
Lead Channel Sensing Intrinsic Amplitude: 16.625 mV
Lead Channel Setting Pacing Amplitude: 2 V
Lead Channel Setting Pacing Amplitude: 2 V
Lead Channel Setting Pacing Pulse Width: 0.4 ms
Lead Channel Setting Sensing Sensitivity: 0.9 mV
Zone Setting Status: 755011
Zone Setting Status: 755011

## 2023-08-04 NOTE — Progress Notes (Signed)
Remote pacemaker transmission.   

## 2023-09-12 ENCOUNTER — Other Ambulatory Visit: Payer: Self-pay | Admitting: Cardiology

## 2023-09-14 NOTE — Telephone Encounter (Signed)
RX sent

## 2023-10-19 ENCOUNTER — Ambulatory Visit (INDEPENDENT_AMBULATORY_CARE_PROVIDER_SITE_OTHER): Payer: Medicare Other

## 2023-10-19 DIAGNOSIS — I495 Sick sinus syndrome: Secondary | ICD-10-CM | POA: Diagnosis not present

## 2023-10-20 LAB — CUP PACEART REMOTE DEVICE CHECK
Battery Remaining Longevity: 7 mo
Battery Voltage: 2.86 V
Brady Statistic AP VP Percent: 0.81 %
Brady Statistic AP VS Percent: 86.29 %
Brady Statistic AS VP Percent: 0.02 %
Brady Statistic AS VS Percent: 12.88 %
Brady Statistic RA Percent Paced: 85.31 %
Brady Statistic RV Percent Paced: 0.79 %
Date Time Interrogation Session: 20250107145625
Implantable Lead Connection Status: 753985
Implantable Lead Connection Status: 753985
Implantable Lead Implant Date: 20150114
Implantable Lead Implant Date: 20150114
Implantable Lead Location: 753859
Implantable Lead Location: 753860
Implantable Lead Model: 5076
Implantable Lead Model: 5076
Implantable Pulse Generator Implant Date: 20150114
Lead Channel Impedance Value: 323 Ohm
Lead Channel Impedance Value: 399 Ohm
Lead Channel Impedance Value: 418 Ohm
Lead Channel Impedance Value: 532 Ohm
Lead Channel Pacing Threshold Amplitude: 0.75 V
Lead Channel Pacing Threshold Amplitude: 0.75 V
Lead Channel Pacing Threshold Pulse Width: 0.4 ms
Lead Channel Pacing Threshold Pulse Width: 0.4 ms
Lead Channel Sensing Intrinsic Amplitude: 1.75 mV
Lead Channel Sensing Intrinsic Amplitude: 1.75 mV
Lead Channel Sensing Intrinsic Amplitude: 17 mV
Lead Channel Sensing Intrinsic Amplitude: 17 mV
Lead Channel Setting Pacing Amplitude: 1.5 V
Lead Channel Setting Pacing Amplitude: 2 V
Lead Channel Setting Pacing Pulse Width: 0.4 ms
Lead Channel Setting Sensing Sensitivity: 0.9 mV
Zone Setting Status: 755011
Zone Setting Status: 755011

## 2023-11-09 ENCOUNTER — Other Ambulatory Visit: Payer: Self-pay | Admitting: Cardiology

## 2023-11-09 NOTE — Telephone Encounter (Signed)
Prescription refill request for Eliquis received. Indication:afib Last office visit:9/24 Scr:0.76  12/24 Age: 78 Weight:100.7  kg  Prescription refilled

## 2023-11-14 ENCOUNTER — Other Ambulatory Visit: Payer: Self-pay | Admitting: Cardiology

## 2023-11-16 ENCOUNTER — Telehealth: Payer: Self-pay | Admitting: Cardiology

## 2023-11-16 NOTE — Telephone Encounter (Signed)
Pt states that she saw that her medication had been changed to 75 mg BID. Pt states that her BP has been good but does not have readings. Pt will do a BP log and call Thursday to determine the dose needed for a refill. Pt verbalized understanding and had no additional questions

## 2023-11-16 NOTE — Telephone Encounter (Signed)
Pt c/o medication issue:  1. Name of Medication: Atenolol  2. How are you currently taking this medication (dosage and times per day)?   3. Are you having a reaction (difficulty breathing--STAT)?   4. What is your medication issue?  She needs to know the correct milligram and how she is supposed to be taking it daily please.

## 2023-11-19 MED ORDER — ATENOLOL 100 MG PO TABS: 100.0000 mg | ORAL_TABLET | Freq: Two times a day (BID) | ORAL | 3 refills | Status: DC

## 2023-11-19 NOTE — Addendum Note (Signed)
 Addended by: Einar Grave on: 11/19/2023 09:47 AM   Modules accepted: Orders

## 2023-11-19 NOTE — Telephone Encounter (Signed)
 Spoke with pt who reports the following BP  2/4 157/81 2/5 135/82 2/6 132/82  Atenolol  100 mg dose sent to pharmacy.

## 2023-11-19 NOTE — Telephone Encounter (Signed)
 Spoke with

## 2023-11-30 NOTE — Progress Notes (Signed)
 Remote pacemaker transmission.

## 2023-11-30 NOTE — Addendum Note (Signed)
Addended by: Geralyn Flash D on: 11/30/2023 09:54 AM   Modules accepted: Orders

## 2023-12-19 ENCOUNTER — Emergency Department (HOSPITAL_BASED_OUTPATIENT_CLINIC_OR_DEPARTMENT_OTHER)
Admission: EM | Admit: 2023-12-19 | Discharge: 2023-12-20 | Disposition: A | Discharge status: Home / Self Care | Attending: Emergency Medicine | Admitting: Emergency Medicine

## 2023-12-19 ENCOUNTER — Encounter (HOSPITAL_BASED_OUTPATIENT_CLINIC_OR_DEPARTMENT_OTHER): Payer: Self-pay | Admitting: Emergency Medicine

## 2023-12-19 ENCOUNTER — Emergency Department (HOSPITAL_BASED_OUTPATIENT_CLINIC_OR_DEPARTMENT_OTHER)

## 2023-12-19 ENCOUNTER — Other Ambulatory Visit: Payer: Self-pay

## 2023-12-19 DIAGNOSIS — Z7984 Long term (current) use of oral hypoglycemic drugs: Secondary | ICD-10-CM | POA: Diagnosis not present

## 2023-12-19 DIAGNOSIS — R0602 Shortness of breath: Secondary | ICD-10-CM | POA: Diagnosis not present

## 2023-12-19 DIAGNOSIS — E119 Type 2 diabetes mellitus without complications: Secondary | ICD-10-CM | POA: Diagnosis not present

## 2023-12-19 DIAGNOSIS — R55 Syncope and collapse: Secondary | ICD-10-CM | POA: Diagnosis not present

## 2023-12-19 DIAGNOSIS — I10 Essential (primary) hypertension: Secondary | ICD-10-CM | POA: Diagnosis not present

## 2023-12-19 DIAGNOSIS — Z7901 Long term (current) use of anticoagulants: Secondary | ICD-10-CM | POA: Insufficient documentation

## 2023-12-19 DIAGNOSIS — R0789 Other chest pain: Secondary | ICD-10-CM | POA: Diagnosis present

## 2023-12-19 LAB — BASIC METABOLIC PANEL
Anion gap: 10 (ref 5–15)
BUN: 16 mg/dL (ref 8–23)
CO2: 24 mmol/L (ref 22–32)
Calcium: 9 mg/dL (ref 8.9–10.3)
Chloride: 100 mmol/L (ref 98–111)
Creatinine, Ser: 0.86 mg/dL (ref 0.44–1.00)
GFR, Estimated: >60 mL/min (ref >60)
Glucose, Bld: 131 mg/dL — ABNORMAL HIGH (ref 70–99)
Potassium: 4.1 mmol/L (ref 3.5–5.1)
Sodium: 134 mmol/L — ABNORMAL LOW (ref 135–145)

## 2023-12-19 LAB — CBC
HCT: 39 % (ref 36.0–46.0)
Hemoglobin: 12.8 g/dL (ref 12.0–15.0)
MCH: 30.3 pg (ref 26.0–34.0)
MCHC: 32.8 g/dL (ref 30.0–36.0)
MCV: 92.4 fL (ref 80.0–100.0)
Platelets: 242 10*3/uL (ref 150–400)
RBC: 4.22 MIL/uL (ref 3.87–5.11)
RDW: 12.5 % (ref 11.5–15.5)
WBC: 7.7 10*3/uL (ref 4.0–10.5)
nRBC: 0 % (ref 0.0–0.2)

## 2023-12-19 LAB — RESP PANEL BY RT-PCR (RSV, FLU A&B, COVID)  RVPGX2
Influenza A by PCR: NEGATIVE
Influenza B by PCR: NEGATIVE
Resp Syncytial Virus by PCR: NEGATIVE
SARS Coronavirus 2 by RT PCR: NEGATIVE

## 2023-12-19 LAB — TROPONIN I (HIGH SENSITIVITY): Troponin I (High Sensitivity): 3 ng/L (ref <18)

## 2023-12-19 LAB — D-DIMER, QUANTITATIVE: D-Dimer, Quant: <0.27 ug{FEU}/mL (ref 0.00–0.50)

## 2023-12-19 NOTE — ED Notes (Signed)
 Pt seen in triage by this RT for SOB. SATS 100% on room air, HR 71 clear/ diminished BS. No distress noted

## 2023-12-19 NOTE — ED Triage Notes (Signed)
 Pt POV in wheelchair- pt c/o feeling faint when getting ready for bed, chest pain, shortness of breath, weakness. Occurred one other time this week.    Pt with pacemaker.   Denies known sick contact.

## 2023-12-19 NOTE — ED Provider Notes (Signed)
 [place] [facility] Provider Note   CSN: [id number] Arrival date & time: 12/19/23  2252     History {Add pertinent medical, surgical, social history, OB history to HPI:1} Chief Complaint  Patient presents with   Near Syncope   Chest Pain    Brooke Pitts is a 78 y.o. female.   Near Syncope Associated symptoms include chest pain.  Chest Pain Associated symptoms: near-syncope        Home Medications Prior to Admission medications   Medication Sig Start Date End Date Taking? Authorizing Provider  apixaban (ELIQUIS) 5 MG TABS tablet Take 1 tablet by mouth twice daily 11/09/23   Revankar, Aundra Dubin, MD  atenolol (TENORMIN) 100 MG tablet Take 1 tablet (100 mg total) by mouth 2 (two) times daily. 11/19/23   Revankar, Aundra Dubin, MD  b complex vitamins tablet Take 1 tablet by mouth daily.    [provider]  dimenhyDRINATE (DRAMAMINE) 50 MG tablet Take 50 mg by mouth every 8 (eight) hours as needed for dizziness.    [provider]  estradiol (ESTRACE) 0.1 MG/GM vaginal cream Place 1 Applicatorful vaginally as needed (dryness). 09/22/22   [provider]  irbesartan (AVAPRO) 300 MG tablet Take 300 mg by mouth daily. 06/18/17   [provider]  lidocaine (LIDODERM) 5 % Place 1 patch onto the skin daily. Remove & Discard patch within 12 hours or as directed by MD Patient taking differently: Place 1 patch onto the skin as needed (pain). Remove & Discard patch within 12 hours or as directed by MD 07/15/22   Prosperi, Ephriam Knuckles H, PA-C  loratadine (CLARITIN) 10 MG tablet Take 10 mg by mouth daily as needed for allergies or rhinitis.    [provider]  magnesium oxide (MAG-OX) 400 MG tablet Take 400 mg by mouth daily.    [provider]  metFORMIN (GLUCOPHAGE-XR) 500 MG 24 hr tablet Take 500 mg by mouth in the morning. and take 1000 mg in the evening 11/26/20   [provider]  methocarbamol  (ROBAXIN) 500 MG tablet Take 1 tablet (500 mg total) by mouth 2 (two) times daily. 07/15/22   Prosperi, Christian H, PA-C  nitroGLYCERIN (NITROSTAT) 0.4 MG SL tablet Place 1 tablet (0.4 mg total) under the tongue every 5 (five) minutes as needed for chest pain. 01/09/22   Revankar, Aundra Dubin, MD  Omega-3 Fatty Acids (FISH OIL) 1200 MG CPDR Take 1 capsule by mouth daily.     [provider]  spironolactone (ALDACTONE) 25 MG tablet Take 1 tablet (25 mg total) by mouth daily. 06/25/23   Revankar, Aundra Dubin, MD      Allergies    Flecainide, Cymbalta [duloxetine hcl], Hydrocodone, Lisinopril, Amlodipine, Losartan, and Nsaids    Review of Systems   Review of Systems  Cardiovascular:  Positive for chest pain and near-syncope.    Physical Exam Updated Vital Signs BP (!) 174/97 (BP Location: Right Wrist)   Pulse 70   Temp 97.8 F (36.6 C)   Resp 18   Ht 5\' 4"  (1.626 m)   Wt 100.7 kg   SpO2 100%   BMI 38.11 kg/m  Physical Exam  ED Results / Procedures / Treatments   Labs (all labs ordered are listed, but only abnormal results are displayed) Labs Reviewed  BASIC METABOLIC PANEL - Abnormal; Notable for the following components:      Result Value   Sodium 134 (*)    Glucose, Bld 131 (*)  All other components within normal limits  RESP PANEL BY RT-PCR (RSV, FLU A&B, COVID)  RVPGX2  CBC  TROPONIN I (HIGH SENSITIVITY)    EKG None  Radiology No results found.  Procedures Procedures  {Document cardiac monitor, telemetry assessment procedure when appropriate:1}  Medications Ordered in ED Medications - No data to display  ED Course/ Medical Decision Making/ A&P   {   Click here for ABCD2, HEART and other calculatorsREFRESH Note before signing :1}                              Medical Decision Making Amount and/or Complexity of Data Reviewed Labs: ordered. Radiology: ordered.   ***  {Document critical care time when appropriate:1} {Document review of labs and  clinical decision tools ie heart score, Chads2Vasc2 etc:1}  {Document your independent review of radiology images, and any outside records:1} {Document your discussion with family members, caretakers, and with consultants:1} {Document social determinants of health affecting pt's care:1} {Document your decision making why or why not admission, treatments were needed:1} Final Clinical Impression(s) / ED Diagnoses Final diagnoses:  None    Rx / DC Orders ED Discharge Orders     None

## 2023-12-20 ENCOUNTER — Encounter (HOSPITAL_BASED_OUTPATIENT_CLINIC_OR_DEPARTMENT_OTHER): Payer: Self-pay

## 2023-12-20 ENCOUNTER — Emergency Department (HOSPITAL_BASED_OUTPATIENT_CLINIC_OR_DEPARTMENT_OTHER)

## 2023-12-20 DIAGNOSIS — R0789 Other chest pain: Secondary | ICD-10-CM | POA: Diagnosis not present

## 2023-12-20 LAB — TROPONIN I (HIGH SENSITIVITY): Troponin I (High Sensitivity): 2 ng/L (ref <18)

## 2023-12-20 LAB — BRAIN NATRIURETIC PEPTIDE: B Natriuretic Peptide: 42.2 pg/mL (ref 0.0–100.0)

## 2023-12-20 MED ORDER — IOHEXOL 350 MG/ML SOLN
100.0000 mL | Freq: Once | INTRAVENOUS | Status: AC | PRN
  Administered 2023-12-20: 100 mL via INTRAVENOUS

## 2023-12-20 MED ORDER — LIDOCAINE 5 % EX PTCH
1.0000 | MEDICATED_PATCH | CUTANEOUS | Status: DC
  Administered 2023-12-20: 1 via TRANSDERMAL
  Filled 2023-12-20: qty 1

## 2023-12-20 NOTE — Discharge Instructions (Addendum)
 Workup today was reassuring to include an EKG, screening labs and CT imaging.  Please follow-up with your cardiologist.  Your pacemaker was interrogated and did not show any immediate concerning findings with no recent abnormal arrhythmia episodes that would contribute to your presentation today.  Your battery life is getting low.

## 2023-12-20 NOTE — ED Notes (Signed)
Orthostatic vitals completed per order. Pt reported no dizziness.

## 2023-12-21 NOTE — Progress Notes (Signed)
 Cardiology Office Note:  .   Date:  12/22/2023  ID:  Brooke Pitts, DOB 1946-09-12, MRN [id number] PCP: [facility] Providers Cardiologist:  Garwin Brothers, MD Electrophysiologist:  Regan Lemming, MD    History of Present Illness: .   Brooke Pitts is a 78 y.o. female with a past medical history of paroxysmal atrial fibrillation on Eliquis, aortic atherosclerosis noted on CT imaging, hypertension, NSVT, sick sinus syndrome s/p PPM implantation in 2019, history of stroke, OSA on CPAP, GERD, DM 2, BPPV, dyslipidemia with statin intolerance.  10/20/2023 device check normal device function, less than 0.1% episode of atrial tachycardia/atrial fibrillation lasting 2 minutes, 7 episodes of NSVT 05/11/2020 echo EF 55 to 60%, mild concentric LVH, grade 1 DD, mild MR 02/25/2019 Lexiscan low risk, normal study  Evaluated by Dr. Tomie China on 06/25/2023, she was bothered by DOE, D-dimer was arranged but was negative.  She was advised to follow-up in 9 months.  Evaluated 12/19/2023 the med [facility] emergency department for chest wall pain right headedness.  Troponins were negative, D-dimer negative, EKG without any changes, pacemaker was interrogated with no contributable findings, CT of the chest was negative.  She presents today for follow-up after recent ED visit as outlined above.  She recounts over the last few months she has been plagued by intermittent episodes of profound fatigue, feelings of near syncope.  She began to review her medications and discovered that when her atenolol dose was changed from 75 mg twice daily to 100 mg twice daily this is when her symptoms started.  She does check her blood pressure at home, it sounds she has had elevated blood pressure readings at home as well.  She is intolerant of several antihypertensive medications.  She denies chest pain, palpitations, dyspnea, pnd, orthopnea, n, v, dizziness, syncope, edema, weight gain, or  early satiety.  Approximately 5 months left on her device  ROS: Review of Systems  Constitutional:  Positive for malaise/fatigue.  Neurological:        Near syncopal sensation     Studies Reviewed: .        Cardiac Studies & Procedures   ______________________________________________________________________________________________   STRESS TESTS  MYOCARDIAL PERFUSION IMAGING 02/25/2019  Narrative  Nuclear stress EF: 67%. The left ventricular ejection fraction is hyperdynamic (>65%).  There was no ST segment deviation noted during stress.  This is a low risk study. No evidence of ischemia or previous infarction  The study is normal.   ECHOCARDIOGRAM  ECHOCARDIOGRAM COMPLETE 05/11/2020  Narrative ECHOCARDIOGRAM REPORT    Patient Name:   Brooke Pitts Date of Exam: 05/11/2020 Medical Rec #:  [id number]         Height:       64.8 in Accession #:    [id number]        Weight:       225.8 lb Date of Birth:  02-Dec-1945        BSA:          2.077 m Patient Age:    73 years          BP:           108/67 mmHg Patient Gender: F                 HR:           71 bpm. Exam Location:  [facility]  Procedure: 2D Echo  Indications:    Shortness of breath [  R06.02 (ICD-10-CM)]  History:        Patient has prior history of Echocardiogram examinations, most recent 06/25/2017. Pacemaker, Stroke, Arrythmias:NSVT (nonsustained ventricular tachycardia), Signs/Symptoms:Localized edema, Obesity; Risk Factors:Dyslipidemia, Hypertension and Diabetes.  Sonographer:    Louie Boston Referring Phys: [id number] KARDIE TOBB  IMPRESSIONS   1. Left ventricular ejection fraction, by estimation, is 55 to 60%. The left ventricle has normal function. The left ventricle has no regional wall motion abnormalities. There is mild concentric left ventricular hypertrophy. Left ventricular diastolic parameters are consistent with Grade I diastolic dysfunction (impaired relaxation). 2. Right ventricular  systolic function is normal. The right ventricular size is normal. There is normal pulmonary artery systolic pressure. 3. The mitral valve is normal in structure. Mild mitral valve regurgitation. No evidence of mitral stenosis. 4. The aortic valve is tricuspid. Aortic valve regurgitation is trivial. No aortic stenosis is present. 5. The inferior vena cava is normal in size with greater than 50% respiratory variability, suggesting right atrial pressure of 3 mmHg.  FINDINGS Left Ventricle: Left ventricular ejection fraction, by estimation, is 55 to 60%. The left ventricle has normal function. The left ventricle has no regional wall motion abnormalities. The left ventricular internal cavity size was normal in size. There is mild concentric left ventricular hypertrophy. Left ventricular diastolic parameters are consistent with Grade I diastolic dysfunction (impaired relaxation). Normal left ventricular filling pressure.  Right Ventricle: The right ventricular size is normal. No increase in right ventricular wall thickness. Right ventricular systolic function is normal. There is normal pulmonary artery systolic pressure. The tricuspid regurgitant velocity is 2.55 m/s, and with an assumed right atrial pressure of 3 mmHg, the estimated right ventricular systolic pressure is 29.0 mmHg.  Left Atrium: Left atrial size was normal in size.  Right Atrium: Right atrial size was normal in size.  Pericardium: There is no evidence of pericardial effusion.  Mitral Valve: The mitral valve is normal in structure. Normal mobility of the mitral valve leaflets. Mild mitral valve regurgitation. No evidence of mitral valve stenosis.  Tricuspid Valve: The tricuspid valve is normal in structure. Tricuspid valve regurgitation is mild . No evidence of tricuspid stenosis.  Aortic Valve: The aortic valve is tricuspid. Aortic valve regurgitation is trivial. No aortic stenosis is present.  Pulmonic Valve: The pulmonic valve  was normal in structure. Pulmonic valve regurgitation is not visualized. No evidence of pulmonic stenosis.  Aorta: The aortic root is normal in size and structure.  Venous: The pulmonary veins were not well visualized. The inferior vena cava was not well visualized. The inferior vena cava is normal in size with greater than 50% respiratory variability, suggesting right atrial pressure of 3 mmHg.  IAS/Shunts: No atrial level shunt detected by color flow Doppler.  Additional Comments: A pacer wire is visualized.   LEFT VENTRICLE PLAX 2D LVIDd:         4.10 cm  Diastology LVIDs:         2.90 cm  LV e' lateral:   6.53 cm/s LV PW:         1.30 cm  LV E/e' lateral: 8.8 LV IVS:        1.30 cm  LV e' medial:    5.57 cm/s LVOT diam:     2.00 cm  LV E/e' medial:  10.3 LV SV:         52 LV SV Index:   25 LVOT Area:     3.14 cm   RIGHT VENTRICLE  IVC RV S prime:     8.73 cm/s  IVC diam: 1.65 cm TAPSE (M-mode): 2.1 cm  LEFT ATRIUM             Index       RIGHT ATRIUM           Index LA diam:        3.10 cm 1.49 cm/m  RA Area:     11.30 cm LA Vol (A2C):   52.5 ml 25.27 ml/m RA Volume:   25.20 ml  12.13 ml/m LA Vol (A4C):   37.4 ml 18.00 ml/m LA Biplane Vol: 45.6 ml 21.95 ml/m AORTIC VALVE LVOT Vmax:   75.40 cm/s LVOT Vmean:  49.600 cm/s LVOT VTI:    0.165 m  AORTA Ao Root diam: 3.20 cm Ao Asc diam:  3.20 cm  MITRAL VALVE               TRICUSPID VALVE MV Area (PHT): 3.65 cm    TR Peak grad:   26.0 mmHg MV Decel Time: 208 msec    TR Vmax:        255.00 cm/s MV E velocity: 57.20 cm/s MV A velocity: 86.80 cm/s  SHUNTS MV E/A ratio:  0.66        Systemic VTI:  0.16 m Systemic Diam: 2.00 cm  Norman Herrlich MD Electronically signed by Norman Herrlich MD Signature Date/Time: 05/11/2020/7:41:37 PM    Final      CT SCANS  CT CORONARY MORPH W/CTA COR W/SCORE 05/31/2020  Addendum 05/31/2020  9:22 PM ADDENDUM REPORT: 05/31/2020 21:19  CLINICAL DATA:  78 year old  female with shortness of breath. Medical history includes hyperlipidemia, CVA and Diabetes Mellitus.  EXAM: Cardiac/Coronary  CT  TECHNIQUE: The patient was scanned on a [employer].  FINDINGS: A 120 kV prospective scan was triggered in the descending thoracic aorta at 111 HU's. Axial non-contrast 3 mm slices were carried out through the heart. The data set was analyzed on a dedicated work station and scored using the Agatson method. Gantry rotation speed was 250 msecs and collimation was .6 mm. No beta blockade and 0.8 mg of sl NTG was given. The 3D data set was reconstructed in 5% intervals of the 67-82 % of the R-R cycle. Diastolic phases were analyzed on a dedicated work station using MPR, MIP and VRT modes. The patient received 80 cc of contrast.  Aorta: Normal size.  No calcifications.  No dissection.  Aortic Valve:  Trileaflet.  No calcifications.  Coronary Arteries:  Normal coronary origin.  Right dominance.  RCA is a large dominant artery that gives rise to PDA and PLVB. There is no plaque.  Left main is a large artery that gives rise to LAD and LCX arteries.  LAD is a large vessel that has no plaque.  LCX is a non-dominant artery that gives rise to one large OM1 branch. There is no plaque.  Other findings:  Normal pulmonary vein drainage into the left atrium.  Normal left atrial appendage without a thrombus.  Normal size of the pulmonary artery.  Pacemaker and pacemaker wire noted.  IMPRESSION: 1. Coronary calcium score of 0. This was 0 percentile for age and sex matched control.  2. Normal coronary origin with right dominance.  3. No evidence of CAD.  Thomasene Ripple, DO   Electronically Signed By: Thomasene Ripple DO On: 05/31/2020 21:19  Narrative EXAM: OVER-READ INTERPRETATION  CT CHEST  The following report is an over-read performed  by radiologist Dr. Trudie Reed of [facility] on 05/31/2020. This over-read does not  include interpretation of cardiac or coronary anatomy or pathology. The coronary calcium score/coronary CTA interpretation by the cardiologist is attached.  COMPARISON:  None.  FINDINGS: Extracardiac findings will be described separately under dictation for contemporaneously obtained chest CT.  IMPRESSION: Please see separate dictation for contemporaneously obtained chest CT 05/31/2020 for full description of relevant extracardiac findings.  Electronically Signed: By: Trudie Reed M.D. On: 05/31/2020 09:12     ______________________________________________________________________________________________      Risk Assessment/Calculations:    CHA2DS2-VASc Score = 7   This indicates a 11.2% annual risk of stroke. The patient's score is based upon: CHF History: 0 HTN History: 1 Diabetes History: 1 Stroke History: 2 Vascular Disease History: 0 Age Score: 2 Gender Score: 1    HYPERTENSION CONTROL Vitals:   12/22/23 1111 12/22/23 1155  BP: (!) 140/82 (!) 140/82    The patient's blood pressure is elevated above target today.  In order to address the patient's elevated BP: Blood pressure will be monitored at home to determine if medication changes need to be made.          Physical Exam:   VS:  BP (!) 140/82   Pulse 73   Ht 5\' 4"  (1.626 m)   Wt 225 lb (102.1 kg)   SpO2 95%   BMI 38.62 kg/m    Wt Readings from Last 3 Encounters:  12/22/23 225 lb (102.1 kg)  12/19/23 222 lb (100.7 kg)  07/09/23 222 lb 0.8 oz (100.7 kg)    GEN: Well nourished, well developed in no acute distress NECK: No JVD; No carotid bruits CARDIAC: RRR, no murmurs, rubs, gallops RESPIRATORY:  Clear to auscultation without rales, wheezing or rhonchi  ABDOMEN: Soft, non-tender, non-distended EXTREMITIES:  No edema; No deformity   ASSESSMENT AND PLAN: .   PAF/hypercoagulable state -she is maintaining sinus rhythm, continue Eliquis 5 mg twice daily-no indication for dose reduction and  denies hematochezia, hematuria, hemoptysis.  Continue atenolol but we will decrease her dose to 75 mg twice daily.   Sick sinus syndrome s/p PPM -most recent device interrogation reveals approximately 5 months left on her battery, will reach out to EP.  Her device was interrogated when she presented to the ED and there were nothing contributory to her symptoms.  Hypertension-blood pressure is elevated 140/82, her atenolol was recently increased to help with her high blood pressure however she cannot tolerate this and we are decreasing it back to 75 mg twice daily.  Currently on irbesartan 300 mg daily, spironolactone 25 mg daily.  Will have her come with blood pressure log for 2 weeks at home before we make any further changes. She has several intolerance/allergies including amlodipine, lisinopril and losartan. If her BP remains elevated, we could try a different calcium channel blocker.        Dispo: Decrease atenolol to 75 mg twice daily, blood pressure log for 2 weeks, follow-up with Dr. Tomie China in [facility] in 3 months.  Signed, Flossie Dibble, NP

## 2023-12-22 ENCOUNTER — Encounter: Payer: Self-pay | Admitting: Cardiology

## 2023-12-22 ENCOUNTER — Ambulatory Visit: Attending: Cardiology | Admitting: Cardiology

## 2023-12-22 VITALS — BP 140/82 | HR 73 | Ht 64.0 in | Wt 225.0 lb

## 2023-12-22 DIAGNOSIS — Z950 Presence of cardiac pacemaker: Secondary | ICD-10-CM | POA: Diagnosis not present

## 2023-12-22 DIAGNOSIS — I480 Paroxysmal atrial fibrillation: Secondary | ICD-10-CM

## 2023-12-22 DIAGNOSIS — I4729 Other ventricular tachycardia: Secondary | ICD-10-CM

## 2023-12-22 DIAGNOSIS — D6859 Other primary thrombophilia: Secondary | ICD-10-CM

## 2023-12-22 DIAGNOSIS — I495 Sick sinus syndrome: Secondary | ICD-10-CM

## 2023-12-22 DIAGNOSIS — I10 Essential (primary) hypertension: Secondary | ICD-10-CM

## 2023-12-22 MED ORDER — ATENOLOL 50 MG PO TABS: 75.0000 mg | ORAL_TABLET | Freq: Two times a day (BID) | ORAL | 3 refills | Status: DC

## 2023-12-22 NOTE — Patient Instructions (Addendum)
 Medication Instructions:   DECREASE: Atenolol to 75mg  twice daily (1 1/2 of 50mg  tablet)  Other Instructions BP log 2 weeks   Lab Work: None Ordered If you have labs (blood work) drawn today and your tests are completely normal, you will receive your results only by: [employer] (if you have MyChart) OR A paper copy in the mail If you have any lab test that is abnormal or we need to change your treatment, we will call you to review the results.   Testing/Procedures: None Ordered   Follow-Up: At [facility], you and your health needs are our priority.  As part of our continuing mission to provide you with exceptional heart care, we have created designated Provider Care Teams.  These Care Teams include your primary Cardiologist (physician) and Advanced Practice Providers (APPs -  Physician Assistants and Nurse Practitioners) who all work together to provide you with the care you need, when you need it.  We recommend signing up for the patient portal called "MyChart".  Sign up information is provided on this After Visit Summary.  MyChart is used to connect with patients for Virtual Visits (Telemedicine).  Patients are able to view lab/test results, encounter notes, upcoming appointments, etc.  Non-urgent messages can be sent to your provider as well.   To learn more about what you can do with MyChart, go to [email or web address].    Your next appointment:   3 month(s)  The format for your next appointment:   In Person  Provider:   Wallis Bamberg, NP

## 2024-01-01 ENCOUNTER — Emergency Department (HOSPITAL_BASED_OUTPATIENT_CLINIC_OR_DEPARTMENT_OTHER)

## 2024-01-01 ENCOUNTER — Encounter (HOSPITAL_BASED_OUTPATIENT_CLINIC_OR_DEPARTMENT_OTHER): Payer: Self-pay

## 2024-01-01 ENCOUNTER — Other Ambulatory Visit: Payer: Self-pay

## 2024-01-01 ENCOUNTER — Emergency Department (HOSPITAL_BASED_OUTPATIENT_CLINIC_OR_DEPARTMENT_OTHER)
Admission: EM | Admit: 2024-01-01 | Discharge: 2024-01-01 | Disposition: A | Discharge status: Home / Self Care | Attending: Emergency Medicine | Admitting: Emergency Medicine

## 2024-01-01 DIAGNOSIS — R200 Anesthesia of skin: Secondary | ICD-10-CM | POA: Insufficient documentation

## 2024-01-01 DIAGNOSIS — R531 Weakness: Secondary | ICD-10-CM | POA: Diagnosis not present

## 2024-01-01 DIAGNOSIS — R55 Syncope and collapse: Secondary | ICD-10-CM | POA: Diagnosis present

## 2024-01-01 LAB — CBC
HCT: 37.8 % (ref 36.0–46.0)
Hemoglobin: 12.9 g/dL (ref 12.0–15.0)
MCH: 31.2 pg (ref 26.0–34.0)
MCHC: 34.1 g/dL (ref 30.0–36.0)
MCV: 91.5 fL (ref 80.0–100.0)
Platelets: 223 10*3/uL (ref 150–400)
RBC: 4.13 MIL/uL (ref 3.87–5.11)
RDW: 12.4 % (ref 11.5–15.5)
WBC: 7.3 10*3/uL (ref 4.0–10.5)
nRBC: 0 % (ref 0.0–0.2)

## 2024-01-01 LAB — BASIC METABOLIC PANEL
Anion gap: 11 (ref 5–15)
BUN: 15 mg/dL (ref 8–23)
CO2: 23 mmol/L (ref 22–32)
Calcium: 9.1 mg/dL (ref 8.9–10.3)
Chloride: 99 mmol/L (ref 98–111)
Creatinine, Ser: 0.96 mg/dL (ref 0.44–1.00)
GFR, Estimated: >60 mL/min (ref >60)
Glucose, Bld: 113 mg/dL — ABNORMAL HIGH (ref 70–99)
Potassium: 3.9 mmol/L (ref 3.5–5.1)
Sodium: 133 mmol/L — ABNORMAL LOW (ref 135–145)

## 2024-01-01 LAB — TROPONIN I (HIGH SENSITIVITY): Troponin I (High Sensitivity): 2 ng/L (ref <18)

## 2024-01-01 NOTE — ED Provider Notes (Signed)
 [place] [facility] Provider Note   CSN: [id number] Arrival date & time: 01/01/24  1511     History Chief Complaint  Patient presents with   Weakness    HPI Brooke Pitts is a 78 y.o. female presenting for a myriad of symptoms. Left arm numbness and weakness intermittently over the past 2 weeks. Seen for similar at that time. States that the symptoms acute worsened today at 1200. Lightheaded and weakness. Recently decreased Atenolol. Patient's recorded medical, surgical, social, medication list and allergies were reviewed in the Snapshot window as part of the initial history.   Review of Systems   Review of Systems  Constitutional:  Positive for fatigue. Negative for chills and fever.  HENT:  Negative for ear pain and sore throat.   Eyes:  Negative for pain and visual disturbance.  Respiratory:  Negative for cough and shortness of breath.   Cardiovascular:  Negative for chest pain and palpitations.  Gastrointestinal:  Negative for abdominal pain and vomiting.  Genitourinary:  Negative for dysuria and hematuria.  Musculoskeletal:  Negative for arthralgias and back pain.  Skin:  Negative for color change and rash.  Neurological:  Positive for weakness and light-headedness. Negative for seizures and syncope.  All other systems reviewed and are negative.   Physical Exam Updated Vital Signs BP (!) 155/97 (BP Location: Right Arm)   Pulse 70   Temp 97.6 F (36.4 C) (Oral)   Resp 18   Wt 101.6 kg   SpO2 99%   BMI 38.45 kg/m  Physical Exam Vitals and nursing note reviewed.  Constitutional:      General: She is not in acute distress.    Appearance: She is well-developed.  HENT:     Head: Normocephalic and atraumatic.  Eyes:     Conjunctiva/sclera: Conjunctivae normal.  Cardiovascular:     Rate and Rhythm: Normal rate and regular rhythm.     Heart sounds: No murmur heard. Pulmonary:     Effort: Pulmonary effort is normal. No  respiratory distress.     Breath sounds: Normal breath sounds.  Abdominal:     General: There is no distension.     Palpations: Abdomen is soft.     Tenderness: There is no abdominal tenderness. There is no right CVA tenderness or left CVA tenderness.  Musculoskeletal:        General: No swelling or tenderness. Normal range of motion.     Cervical back: Neck supple.  Skin:    General: Skin is warm and dry.  Neurological:     General: No focal deficit present.     Mental Status: She is alert and oriented to person, place, and time. Mental status is at baseline.     Cranial Nerves: No cranial nerve deficit.      ED Course/ Medical Decision Making/ A&P    Procedures Procedures   Medications Ordered in ED Medications - No data to display Medical Decision Making:   Brooke Pitts is a 78 y.o. female who presented to the ED today with a syncopal episode detailed above.    Patient placed on continuous vitals and telemetry monitoring while in ED which was reviewed periodically.  Complete initial physical exam performed, notably the patient  was HDS in NAD.    Reviewed and confirmed nursing documentation for past medical history, family history, social history.    Initial Assessment:   With the patient's presentation of syncope, most likely diagnosis is orthostatic hypotension vs  vasovagal episode. Other diagnoses were considered including (but not limited to) arrythmogenic syncope, valvular abnormality, PE, aortic dissection. These are considered less likely due to history of present illness and physical exam findings.   This is most consistent with an acute life/limb threatening illness complicated by underlying chronic conditions. Additionally, patient's history appears more consistent with benign episodes including orthostatic event vs  vagal episode based on history of similar, recent medication changes.   Initial Plan:  Screening labs including CBC and Metabolic panel to  evaluate for infectious or metabolic etiology of disease.  Urinalysis with reflex culture ordered to evaluate for UTI or relevant urologic/nephrologic pathology.  CXR to evaluate for structural/infectious intrathoracic pathology.  Troponin/EKG to evaluate for cardiac pathology. Objective evaluation as below reviewed after administration of IVF/Telemetry monitoring  Initial Study Results:   Laboratory  All laboratory results reviewed without evidence of clinically relevant pathology.    EKG EKG was reviewed independently. Rate, rhythm, axis, intervals all examined and without medically relevant abnormality. ST segments without concerns for elevations.    Radiology:  All images reviewed independently. Agree with radiology report at this time.   DG Chest 2 View Result Date: 01/01/2024 CLINICAL DATA:  Left shoulder and chest pain. EXAM: CHEST - 2 VIEW COMPARISON:  12/20/2023 FINDINGS: The heart size and mediastinal contours are within normal limits. Dual lead pacemaker remains in appropriate position. Both lungs are clear. The visualized skeletal structures are unremarkable. IMPRESSION: No active cardiopulmonary disease. Electronically Signed   By: Danae Orleans M.D.   On: 01/01/2024 17:32   CT Angio Chest/Abd/Pel for Dissection W and/or Wo Contrast Result Date: 12/20/2023 CLINICAL DATA:  Chest pain, shortness of breath and weakness. EXAM: CT ANGIOGRAPHY CHEST, ABDOMEN AND PELVIS TECHNIQUE: Noncontrast axial chest CT was initially obtained. Multidetector CT imaging through the chest, abdomen and pelvis was performed using the standard protocol during bolus administration of intravenous contrast. Multiplanar reconstructed images and MIPs were obtained and reviewed to evaluate the vascular anatomy. RADIATION DOSE REDUCTION: This exam was performed according to the departmental dose-optimization program which includes automated exposure control, adjustment of the mA and/or kV according to patient size  and/or use of iterative reconstruction technique. CONTRAST:  OMNIPAQUE IOHEXOL 350 MG/ML SOLN COMPARISON:  Chest CT with contrast 05/31/2020, abdomen and pelvis CT without contrast 07/15/2022. Also PA lateral chest the, portable chest 08/21/2022 FINDINGS: CTA CHEST FINDINGS Cardiovascular: Left chest dual lead pacing system and right heart wire insertions are unaltered. Roughly equal opacification of the pulmonary arteries and systemic arterial structures. No arterial embolism is seen, no arterial or venous dilatation. The heart is upper limit of normal in size but unchanged. There are no visible coronary calcifications, no pericardial effusion. Small amount of atherosclerosis in the aortic isthmus. No aortic or great vessel aneurysm, stenosis or dissection, or penetrating ulcer. There is a two-vessel aortic arch with a normal variant brachiobicarotid trunk. Mediastinum/Nodes: No enlarged mediastinal, hilar, or axillary lymph nodes. Thyroid gland, trachea, and esophagus demonstrate no significant findings. Mild elevation right hemidiaphragm is again noted. Lungs/Pleura: Lungs are clear. No pleural effusion or pneumothorax. Musculoskeletal: Moderate thoracic spondylosis. No acute or significant osseous findings. The visualized chest wall is unremarkable. Review of the MIP images confirms the above findings. CTA ABDOMEN AND PELVIS FINDINGS VASCULAR Aorta: Normal caliber aorta without aneurysm, dissection, vasculitis or significant stenosis. There is a small amount of scattered calcific plaque. Celiac: Widely patent.  No branch occlusion. SMA: Widely patent.  No branch occlusion. Renals: Both are single. Both  are widely patent with nonstenosing bilateral ostial wall plaques. IMA: Widely patent. Inflow: Patent without evidence of aneurysm, dissection, vasculitis or significant stenosis. Both external arteries are plaque free. There are mild nonstenosing calcific plaques in the right common iliac and both internal  iliac arteries. Veins: No obvious venous abnormality within the limitations of this arterial phase study. Multiple pelvic phleboliths. Review of the MIP images confirms the above findings. NON-VASCULAR Hepatobiliary: No focal liver abnormality is seen. Status post cholecystectomy. No biliary dilatation. Pancreas: No abnormality. Spleen: No abnormality or splenomegaly. Adrenals/Urinary Tract: Adrenal glands are unremarkable. Kidneys are normal, without renal calculi, focal lesion, or hydronephrosis. Bladder is unremarkable. Stomach/Bowel: No dilatation or wall thickening including the appendix. Diverticulosis descending and sigmoid colon without evidence of acute diverticulitis. No focal mesenteric inflammatory changes throughout. Lymphatic: No adenopathy. Reproductive: Status post hysterectomy. No adnexal masses. Other: No abdominal wall hernia or abnormality. No abdominopelvic ascites. Musculoskeletal: Advanced degenerative disc change with vacuum phenomenon and spondylosis L4-5 and L5-S1. Mild osteopenia. No acute or significant osseous findings. Bridging osteophytes anterior right SI joint. Review of the MIP images confirms the above findings. IMPRESSION: 1. No acute CT or CTA findings in the chest, abdomen or pelvis. 2. No evidence of pulmonary arterial embolism. 3. Aortic atherosclerosis without aneurysm or dissection. 4. Diverticulosis without evidence of diverticulitis. 5. Osteopenia and degenerative change. Aortic Atherosclerosis (ICD10-I70.0). Electronically Signed   By: Almira Bar M.D.   On: 12/20/2023 05:41   DG Chest 2 View Result Date: 12/20/2023 CLINICAL DATA:  Chest pain and shortness of breath EXAM: CHEST - 2 VIEW COMPARISON:  August 21, 2022 FINDINGS: There is stable dual lead AICD positioning. The heart size and mediastinal contours are within normal limits. Both lungs are clear. Radiopaque surgical clips are seen within the right upper quadrant. The visualized skeletal structures are  unremarkable. IMPRESSION: No active cardiopulmonary disease. Electronically Signed   By: Aram Candela M.D.   On: 12/20/2023 01:43        Final Assessment and Plan:   Troponin 2 at this time. Blood pressure is down trended spontaneously to the 150s. Patient states her symptoms have grossly improved. Long conversation, uncertain the underlying etiology of her recurrent near syncopal events. Unlikely be cardiac based on description.  States that it occurs when her blood pressure both drops or goes time. Her blood pressure has been intermittently high today but her symptoms are grossly resolved. May be related to when she takes her medication as her repeat visit seem to be after she takes her morning medications. However currently she is asymptomatic ambulatory tolerating p.o. intake.  No focal findings on my repeat exam.  Observed in the ED for 3 hours with gross resolution.  Patient feels comfortable with outpatient care management follow-up with PCP. Notably, when she presented to the emergency department 13 days ago, she underwent extensive evaluation including pacemaker interrogation, dissection studies, serial troponins, BNP, D-dimer.  None of the study showed any focal pathology and she was referred back out to her cardiologist. Discussed repeating these extensive evaluations, however patient strongly preferred a more expeditious evaluation today given the minor nature/improving nature of her symptoms. Patient expressed understanding of risk of missed disease based on limitations of today's evaluation.  Disposition:  I have considered need for hospitalization, however, considering all of the above, I believe this patient is stable for discharge at this time.  Patient/family educated about specific return precautions for given chief complaint and symptoms.  Patient/family educated about follow-up with PCP.  Patient/family expressed understanding of return precautions and need for  follow-up. Patient spoken to regarding all imaging and laboratory results and appropriate follow up for these results. All education provided in verbal form with additional information in written form. Time was allowed for answering of patient questions. Patient discharged.    Emergency Department Medication Summary:   Medications - No data to display  Clinical Impression:  1. Near syncope      Discharge   Final Clinical Impression(s) / ED Diagnoses Final diagnoses:  Near syncope    Rx / DC Orders ED Discharge Orders     None         Glyn Ade, MD 01/01/24 [phone]

## 2024-01-01 NOTE — ED Triage Notes (Addendum)
 Pt ambulatory to triage. Reports left arm weakness that begins in shoulder and goes down left arm to fingers. Began today 12 pm Tingling to left arm. Discomfort left shoulder blade.  Episodes have been occurring intermittently since last November . Most recent on Wednesday. Seen by PCP and sent for eval. Pt has been having problems since beginning Atenolol. Dose has been decreased. Denies chest pain BEFAST -

## 2024-01-01 NOTE — ED Notes (Signed)
 Reviewed discharge instructions and follow up with pt. Pt states understanding. Pt ambulatory at discharge

## 2024-01-16 ENCOUNTER — Telehealth: Payer: Self-pay | Admitting: Cardiology

## 2024-01-18 ENCOUNTER — Ambulatory Visit (INDEPENDENT_AMBULATORY_CARE_PROVIDER_SITE_OTHER): Payer: Medicare Other

## 2024-01-18 DIAGNOSIS — I495 Sick sinus syndrome: Secondary | ICD-10-CM

## 2024-01-18 DIAGNOSIS — I480 Paroxysmal atrial fibrillation: Secondary | ICD-10-CM

## 2024-01-18 NOTE — Telephone Encounter (Signed)
 Called and spoke to patient. She reports that she has continued to have high blood pressure. She also has had multiple episodes of dizziness and lightheadedness. Patient states that she has not felt good for a week due to the symptoms she is having. Patient reports she was in the ER on 3/21 for the same symptoms, and they have not improved since then. Advised patient I would send message to Wallis Bamberg, NP regarding this, and contact her once she has responded. Patient  verbalized understanding and had no further questions.

## 2024-01-19 LAB — CUP PACEART REMOTE DEVICE CHECK
Battery Remaining Longevity: 4 mo
Battery Voltage: 2.85 V
Brady Statistic AP VP Percent: 0.09 %
Brady Statistic AP VS Percent: 88.18 %
Brady Statistic AS VP Percent: 0 %
Brady Statistic AS VS Percent: 11.72 %
Brady Statistic RA Percent Paced: 86.17 %
Brady Statistic RV Percent Paced: 0.09 %
Date Time Interrogation Session: 20250408081224
Implantable Lead Connection Status: 753985
Implantable Lead Connection Status: 753985
Implantable Lead Implant Date: 20150114
Implantable Lead Implant Date: 20150114
Implantable Lead Location: 753859
Implantable Lead Location: 753860
Implantable Lead Model: 5076
Implantable Lead Model: 5076
Implantable Pulse Generator Implant Date: 20150114
Lead Channel Impedance Value: 323 Ohm
Lead Channel Impedance Value: 399 Ohm
Lead Channel Impedance Value: 418 Ohm
Lead Channel Impedance Value: 513 Ohm
Lead Channel Pacing Threshold Amplitude: 0.875 V
Lead Channel Pacing Threshold Amplitude: 0.875 V
Lead Channel Pacing Threshold Pulse Width: 0.4 ms
Lead Channel Pacing Threshold Pulse Width: 0.4 ms
Lead Channel Sensing Intrinsic Amplitude: 1.625 mV
Lead Channel Sensing Intrinsic Amplitude: 1.625 mV
Lead Channel Sensing Intrinsic Amplitude: 16.625 mV
Lead Channel Sensing Intrinsic Amplitude: 16.625 mV
Lead Channel Setting Pacing Amplitude: 1.75 V
Lead Channel Setting Pacing Amplitude: 2 V
Lead Channel Setting Pacing Pulse Width: 0.4 ms
Lead Channel Setting Sensing Sensitivity: 0.9 mV
Zone Setting Status: 755011
Zone Setting Status: 755011

## 2024-01-19 MED ORDER — CARVEDILOL 3.125 MG PO TABS: 3.1250 mg | ORAL_TABLET | Freq: Two times a day (BID) | ORAL | 3 refills | Status: AC

## 2024-01-19 NOTE — Telephone Encounter (Addendum)
 Called and spoke to patient. Reviewed Jimmy Footman note to stop Atenolol and start Carvedilol 3.125 mg twice a day. Patient verbalized understanding and had no further questions. Rx sent to pharmacy.

## 2024-01-19 NOTE — Telephone Encounter (Signed)
 Called and spoke to patient. Reviewed Brooke Pitts's note. Patient states that she believes her symptoms are related to Atenolol. Patient reports that she would like to be on a different medication. Advised patient I would send a message to [facility].

## 2024-01-19 NOTE — Addendum Note (Signed)
 Addended by: Lonia Farber on: 01/19/2024 03:01 PM   Modules accepted: Orders

## 2024-01-26 ENCOUNTER — Telehealth: Payer: Self-pay

## 2024-02-04 NOTE — Telephone Encounter (Signed)
Opening error 

## 2024-02-16 ENCOUNTER — Encounter

## 2024-03-03 NOTE — Progress Notes (Signed)
 Remote pacemaker transmission.

## 2024-03-03 NOTE — Addendum Note (Signed)
 Addended by: Edra Govern D on: 03/03/2024 02:55 PM   Modules accepted: Orders

## 2024-03-17 ENCOUNTER — Encounter

## 2024-03-23 ENCOUNTER — Ambulatory Visit: Admitting: Cardiology

## 2024-05-07 ENCOUNTER — Other Ambulatory Visit: Payer: Self-pay | Admitting: Cardiology

## 2024-05-09 NOTE — Telephone Encounter (Signed)
 Called and spoke with [place] at [facility]. Made her aware pt is now seen by Dr Lilian at [facility]. She states the prescription will be sent to their office.

## 2024-05-09 NOTE — Telephone Encounter (Signed)
 Prescription refill request for Eliquis  received. Indication: Afib   Last office visit: 12/22/23 Aneita)  Scr: 0.86 (04/01/24)  Age: 78 Weight: 101.6kg  Pt overdue to see [facility]. Pt is now seen by [facility]. Will reach out to pharmacy to make them aware refill request should be sent to [facility] at this time.

## 2024-05-18 ENCOUNTER — Encounter

## 2024-06-20 ENCOUNTER — Encounter
